# Patient Record
Sex: Male | Born: 1945 | Race: Black or African American | Hispanic: No | Marital: Married | Smoking: Former smoker
Health system: Southern US, Community
[De-identification: ages and names within clinical notes are randomized; demographics above are authoritative.]

## PROBLEM LIST (undated history)

## (undated) DIAGNOSIS — E119 Type 2 diabetes mellitus without complications: Secondary | ICD-10-CM

## (undated) DIAGNOSIS — I2510 Atherosclerotic heart disease of native coronary artery without angina pectoris: Secondary | ICD-10-CM

## (undated) DIAGNOSIS — I5022 Chronic systolic (congestive) heart failure: Secondary | ICD-10-CM

## (undated) DIAGNOSIS — N183 Chronic kidney disease, stage 3 unspecified: Secondary | ICD-10-CM

## (undated) DIAGNOSIS — N289 Disorder of kidney and ureter, unspecified: Secondary | ICD-10-CM

## (undated) DIAGNOSIS — I10 Essential (primary) hypertension: Secondary | ICD-10-CM

## (undated) DIAGNOSIS — Z95810 Presence of automatic (implantable) cardiac defibrillator: Secondary | ICD-10-CM

## (undated) DIAGNOSIS — C801 Malignant (primary) neoplasm, unspecified: Secondary | ICD-10-CM

## (undated) HISTORY — DX: Chronic kidney disease, stage 3 unspecified: N18.30

## (undated) HISTORY — PX: ICD IMPLANT: EP1208

## (undated) HISTORY — DX: Chronic systolic (congestive) heart failure: I50.22

## (undated) HISTORY — PX: KNEE ARTHROPLASTY: SHX992

## (undated) HISTORY — PX: CORONARY ANGIOPLASTY WITH STENT PLACEMENT: SHX49

## (undated) HISTORY — DX: Chronic kidney disease, stage 3 (moderate): N18.3

---

## 1974-05-19 HISTORY — PX: NEPHRECTOMY: SHX65

## 2014-10-18 DIAGNOSIS — Z87891 Personal history of nicotine dependence: Secondary | ICD-10-CM | POA: Diagnosis not present

## 2014-10-18 DIAGNOSIS — R531 Weakness: Secondary | ICD-10-CM | POA: Diagnosis not present

## 2014-10-18 DIAGNOSIS — Z8249 Family history of ischemic heart disease and other diseases of the circulatory system: Secondary | ICD-10-CM | POA: Diagnosis not present

## 2014-10-18 DIAGNOSIS — I10 Essential (primary) hypertension: Secondary | ICD-10-CM | POA: Diagnosis not present

## 2014-10-18 DIAGNOSIS — I519 Heart disease, unspecified: Secondary | ICD-10-CM | POA: Diagnosis not present

## 2014-10-18 DIAGNOSIS — I2510 Atherosclerotic heart disease of native coronary artery without angina pectoris: Secondary | ICD-10-CM | POA: Diagnosis not present

## 2014-10-18 DIAGNOSIS — R42 Dizziness and giddiness: Secondary | ICD-10-CM | POA: Diagnosis not present

## 2014-10-18 DIAGNOSIS — Z955 Presence of coronary angioplasty implant and graft: Secondary | ICD-10-CM | POA: Diagnosis not present

## 2014-10-18 DIAGNOSIS — R0602 Shortness of breath: Secondary | ICD-10-CM | POA: Diagnosis not present

## 2014-10-18 DIAGNOSIS — Z79899 Other long term (current) drug therapy: Secondary | ICD-10-CM | POA: Diagnosis not present

## 2014-10-18 DIAGNOSIS — Z8673 Personal history of transient ischemic attack (TIA), and cerebral infarction without residual deficits: Secondary | ICD-10-CM | POA: Diagnosis not present

## 2014-10-18 DIAGNOSIS — Z85528 Personal history of other malignant neoplasm of kidney: Secondary | ICD-10-CM | POA: Diagnosis not present

## 2014-10-18 DIAGNOSIS — R0609 Other forms of dyspnea: Secondary | ICD-10-CM | POA: Diagnosis not present

## 2014-11-10 DIAGNOSIS — W228XXA Striking against or struck by other objects, initial encounter: Secondary | ICD-10-CM | POA: Diagnosis not present

## 2014-11-10 DIAGNOSIS — S5001XA Contusion of right elbow, initial encounter: Secondary | ICD-10-CM | POA: Diagnosis not present

## 2014-11-10 DIAGNOSIS — Z85528 Personal history of other malignant neoplasm of kidney: Secondary | ICD-10-CM | POA: Diagnosis not present

## 2014-11-10 DIAGNOSIS — I10 Essential (primary) hypertension: Secondary | ICD-10-CM | POA: Diagnosis not present

## 2014-11-10 DIAGNOSIS — Z955 Presence of coronary angioplasty implant and graft: Secondary | ICD-10-CM | POA: Diagnosis not present

## 2014-11-10 DIAGNOSIS — I159 Secondary hypertension, unspecified: Secondary | ICD-10-CM | POA: Diagnosis not present

## 2014-11-10 DIAGNOSIS — Z8673 Personal history of transient ischemic attack (TIA), and cerebral infarction without residual deficits: Secondary | ICD-10-CM | POA: Diagnosis not present

## 2014-11-10 DIAGNOSIS — Z79899 Other long term (current) drug therapy: Secondary | ICD-10-CM | POA: Diagnosis not present

## 2014-11-10 DIAGNOSIS — Z8249 Family history of ischemic heart disease and other diseases of the circulatory system: Secondary | ICD-10-CM | POA: Diagnosis not present

## 2014-11-10 DIAGNOSIS — M25521 Pain in right elbow: Secondary | ICD-10-CM | POA: Diagnosis not present

## 2015-05-20 DIAGNOSIS — I2510 Atherosclerotic heart disease of native coronary artery without angina pectoris: Secondary | ICD-10-CM

## 2015-05-20 HISTORY — DX: Atherosclerotic heart disease of native coronary artery without angina pectoris: I25.10

## 2015-06-05 DIAGNOSIS — I10 Essential (primary) hypertension: Secondary | ICD-10-CM | POA: Diagnosis not present

## 2015-06-05 DIAGNOSIS — M109 Gout, unspecified: Secondary | ICD-10-CM | POA: Diagnosis not present

## 2015-06-05 DIAGNOSIS — M79672 Pain in left foot: Secondary | ICD-10-CM | POA: Diagnosis not present

## 2015-06-05 DIAGNOSIS — Z79899 Other long term (current) drug therapy: Secondary | ICD-10-CM | POA: Diagnosis not present

## 2015-06-05 DIAGNOSIS — Z85528 Personal history of other malignant neoplasm of kidney: Secondary | ICD-10-CM | POA: Diagnosis not present

## 2015-06-05 DIAGNOSIS — Z87891 Personal history of nicotine dependence: Secondary | ICD-10-CM | POA: Diagnosis not present

## 2015-06-05 DIAGNOSIS — Z8673 Personal history of transient ischemic attack (TIA), and cerebral infarction without residual deficits: Secondary | ICD-10-CM | POA: Diagnosis not present

## 2015-06-05 DIAGNOSIS — Z95818 Presence of other cardiac implants and grafts: Secondary | ICD-10-CM | POA: Diagnosis not present

## 2015-06-05 DIAGNOSIS — M7989 Other specified soft tissue disorders: Secondary | ICD-10-CM | POA: Diagnosis not present

## 2015-06-05 DIAGNOSIS — I519 Heart disease, unspecified: Secondary | ICD-10-CM | POA: Diagnosis not present

## 2015-10-12 DIAGNOSIS — Z85528 Personal history of other malignant neoplasm of kidney: Secondary | ICD-10-CM | POA: Diagnosis not present

## 2015-10-12 DIAGNOSIS — Z79899 Other long term (current) drug therapy: Secondary | ICD-10-CM | POA: Diagnosis not present

## 2015-10-12 DIAGNOSIS — G4730 Sleep apnea, unspecified: Secondary | ICD-10-CM | POA: Diagnosis not present

## 2015-10-12 DIAGNOSIS — I519 Heart disease, unspecified: Secondary | ICD-10-CM | POA: Diagnosis not present

## 2015-10-12 DIAGNOSIS — Z905 Acquired absence of kidney: Secondary | ICD-10-CM | POA: Diagnosis not present

## 2015-10-12 DIAGNOSIS — Z955 Presence of coronary angioplasty implant and graft: Secondary | ICD-10-CM | POA: Diagnosis not present

## 2015-10-12 DIAGNOSIS — Z8673 Personal history of transient ischemic attack (TIA), and cerebral infarction without residual deficits: Secondary | ICD-10-CM | POA: Diagnosis not present

## 2015-10-12 DIAGNOSIS — Z8249 Family history of ischemic heart disease and other diseases of the circulatory system: Secondary | ICD-10-CM | POA: Diagnosis not present

## 2015-10-12 DIAGNOSIS — Z7982 Long term (current) use of aspirin: Secondary | ICD-10-CM | POA: Diagnosis not present

## 2015-10-12 DIAGNOSIS — M109 Gout, unspecified: Secondary | ICD-10-CM | POA: Diagnosis not present

## 2015-10-12 DIAGNOSIS — I10 Essential (primary) hypertension: Secondary | ICD-10-CM | POA: Diagnosis not present

## 2016-01-06 DIAGNOSIS — Z79899 Other long term (current) drug therapy: Secondary | ICD-10-CM | POA: Diagnosis not present

## 2016-01-06 DIAGNOSIS — I10 Essential (primary) hypertension: Secondary | ICD-10-CM | POA: Diagnosis not present

## 2016-01-06 DIAGNOSIS — I5042 Chronic combined systolic (congestive) and diastolic (congestive) heart failure: Secondary | ICD-10-CM | POA: Diagnosis not present

## 2016-01-06 DIAGNOSIS — I130 Hypertensive heart and chronic kidney disease with heart failure and stage 1 through stage 4 chronic kidney disease, or unspecified chronic kidney disease: Secondary | ICD-10-CM | POA: Diagnosis not present

## 2016-01-06 DIAGNOSIS — N183 Chronic kidney disease, stage 3 (moderate): Secondary | ICD-10-CM | POA: Diagnosis not present

## 2016-01-06 DIAGNOSIS — E785 Hyperlipidemia, unspecified: Secondary | ICD-10-CM | POA: Diagnosis not present

## 2016-01-06 DIAGNOSIS — I2510 Atherosclerotic heart disease of native coronary artery without angina pectoris: Secondary | ICD-10-CM | POA: Diagnosis not present

## 2016-01-06 DIAGNOSIS — R42 Dizziness and giddiness: Secondary | ICD-10-CM | POA: Diagnosis not present

## 2016-01-06 DIAGNOSIS — Z955 Presence of coronary angioplasty implant and graft: Secondary | ICD-10-CM | POA: Diagnosis not present

## 2016-01-07 DIAGNOSIS — N183 Chronic kidney disease, stage 3 (moderate): Secondary | ICD-10-CM | POA: Diagnosis not present

## 2016-01-07 DIAGNOSIS — I5042 Chronic combined systolic (congestive) and diastolic (congestive) heart failure: Secondary | ICD-10-CM | POA: Diagnosis not present

## 2016-01-07 DIAGNOSIS — E784 Other hyperlipidemia: Secondary | ICD-10-CM | POA: Diagnosis not present

## 2016-01-07 DIAGNOSIS — R42 Dizziness and giddiness: Secondary | ICD-10-CM | POA: Diagnosis not present

## 2016-01-08 DIAGNOSIS — R42 Dizziness and giddiness: Secondary | ICD-10-CM | POA: Diagnosis not present

## 2016-01-08 DIAGNOSIS — E784 Other hyperlipidemia: Secondary | ICD-10-CM | POA: Diagnosis not present

## 2016-01-08 DIAGNOSIS — I5042 Chronic combined systolic (congestive) and diastolic (congestive) heart failure: Secondary | ICD-10-CM | POA: Diagnosis not present

## 2016-01-08 DIAGNOSIS — N183 Chronic kidney disease, stage 3 (moderate): Secondary | ICD-10-CM | POA: Diagnosis not present

## 2016-01-15 DIAGNOSIS — I25118 Atherosclerotic heart disease of native coronary artery with other forms of angina pectoris: Secondary | ICD-10-CM | POA: Diagnosis not present

## 2016-01-15 DIAGNOSIS — N183 Chronic kidney disease, stage 3 (moderate): Secondary | ICD-10-CM | POA: Diagnosis not present

## 2016-01-15 DIAGNOSIS — H8113 Benign paroxysmal vertigo, bilateral: Secondary | ICD-10-CM | POA: Diagnosis not present

## 2016-01-15 DIAGNOSIS — G4733 Obstructive sleep apnea (adult) (pediatric): Secondary | ICD-10-CM | POA: Diagnosis not present

## 2016-01-15 DIAGNOSIS — I10 Essential (primary) hypertension: Secondary | ICD-10-CM | POA: Diagnosis not present

## 2016-01-15 DIAGNOSIS — E784 Other hyperlipidemia: Secondary | ICD-10-CM | POA: Diagnosis not present

## 2016-01-15 DIAGNOSIS — I5032 Chronic diastolic (congestive) heart failure: Secondary | ICD-10-CM | POA: Diagnosis not present

## 2016-03-24 DIAGNOSIS — Z85528 Personal history of other malignant neoplasm of kidney: Secondary | ICD-10-CM | POA: Diagnosis not present

## 2016-03-24 DIAGNOSIS — Z87891 Personal history of nicotine dependence: Secondary | ICD-10-CM | POA: Diagnosis not present

## 2016-03-24 DIAGNOSIS — N183 Chronic kidney disease, stage 3 (moderate): Secondary | ICD-10-CM | POA: Diagnosis not present

## 2016-03-24 DIAGNOSIS — E119 Type 2 diabetes mellitus without complications: Secondary | ICD-10-CM | POA: Diagnosis not present

## 2016-03-24 DIAGNOSIS — I130 Hypertensive heart and chronic kidney disease with heart failure and stage 1 through stage 4 chronic kidney disease, or unspecified chronic kidney disease: Secondary | ICD-10-CM | POA: Diagnosis not present

## 2016-03-24 DIAGNOSIS — I509 Heart failure, unspecified: Secondary | ICD-10-CM | POA: Diagnosis not present

## 2016-03-24 DIAGNOSIS — Z7982 Long term (current) use of aspirin: Secondary | ICD-10-CM | POA: Diagnosis not present

## 2016-03-24 DIAGNOSIS — R079 Chest pain, unspecified: Secondary | ICD-10-CM | POA: Diagnosis not present

## 2016-03-24 DIAGNOSIS — Z8249 Family history of ischemic heart disease and other diseases of the circulatory system: Secondary | ICD-10-CM | POA: Diagnosis not present

## 2016-03-24 DIAGNOSIS — Z8673 Personal history of transient ischemic attack (TIA), and cerebral infarction without residual deficits: Secondary | ICD-10-CM | POA: Diagnosis not present

## 2016-03-24 DIAGNOSIS — Z79899 Other long term (current) drug therapy: Secondary | ICD-10-CM | POA: Diagnosis not present

## 2016-03-24 DIAGNOSIS — Z7902 Long term (current) use of antithrombotics/antiplatelets: Secondary | ICD-10-CM | POA: Diagnosis not present

## 2016-03-24 DIAGNOSIS — K2970 Gastritis, unspecified, without bleeding: Secondary | ICD-10-CM | POA: Diagnosis not present

## 2016-03-24 DIAGNOSIS — Z955 Presence of coronary angioplasty implant and graft: Secondary | ICD-10-CM | POA: Diagnosis not present

## 2016-06-23 DIAGNOSIS — B0229 Other postherpetic nervous system involvement: Secondary | ICD-10-CM | POA: Diagnosis not present

## 2016-06-23 DIAGNOSIS — B029 Zoster without complications: Secondary | ICD-10-CM | POA: Diagnosis not present

## 2017-09-12 DIAGNOSIS — Z79899 Other long term (current) drug therapy: Secondary | ICD-10-CM | POA: Diagnosis not present

## 2017-09-12 DIAGNOSIS — I119 Hypertensive heart disease without heart failure: Secondary | ICD-10-CM | POA: Diagnosis not present

## 2017-09-12 DIAGNOSIS — S91202A Unspecified open wound of left great toe with damage to nail, initial encounter: Secondary | ICD-10-CM | POA: Diagnosis not present

## 2017-09-12 DIAGNOSIS — W230XXA Caught, crushed, jammed, or pinched between moving objects, initial encounter: Secondary | ICD-10-CM | POA: Diagnosis not present

## 2017-09-12 DIAGNOSIS — Z87891 Personal history of nicotine dependence: Secondary | ICD-10-CM | POA: Diagnosis not present

## 2017-09-12 DIAGNOSIS — Z7902 Long term (current) use of antithrombotics/antiplatelets: Secondary | ICD-10-CM | POA: Diagnosis not present

## 2017-09-12 DIAGNOSIS — S91209A Unspecified open wound of unspecified toe(s) with damage to nail, initial encounter: Secondary | ICD-10-CM | POA: Diagnosis not present

## 2017-09-12 DIAGNOSIS — Z8673 Personal history of transient ischemic attack (TIA), and cerebral infarction without residual deficits: Secondary | ICD-10-CM | POA: Diagnosis not present

## 2017-09-12 DIAGNOSIS — Z955 Presence of coronary angioplasty implant and graft: Secondary | ICD-10-CM | POA: Diagnosis not present

## 2017-09-12 DIAGNOSIS — Z8249 Family history of ischemic heart disease and other diseases of the circulatory system: Secondary | ICD-10-CM | POA: Diagnosis not present

## 2017-11-29 ENCOUNTER — Emergency Department (HOSPITAL_COMMUNITY): Payer: No Typology Code available for payment source

## 2017-11-29 ENCOUNTER — Other Ambulatory Visit: Payer: Self-pay

## 2017-11-29 ENCOUNTER — Inpatient Hospital Stay (HOSPITAL_COMMUNITY)
Admission: EM | Admit: 2017-11-29 | Discharge: 2017-12-01 | DRG: 291 | Disposition: A | Discharge status: Home / Self Care | Payer: No Typology Code available for payment source | Attending: Internal Medicine | Admitting: Internal Medicine

## 2017-11-29 ENCOUNTER — Encounter (HOSPITAL_COMMUNITY): Payer: Self-pay | Admitting: Emergency Medicine

## 2017-11-29 DIAGNOSIS — I130 Hypertensive heart and chronic kidney disease with heart failure and stage 1 through stage 4 chronic kidney disease, or unspecified chronic kidney disease: Principal | ICD-10-CM | POA: Diagnosis present

## 2017-11-29 DIAGNOSIS — I441 Atrioventricular block, second degree: Secondary | ICD-10-CM | POA: Diagnosis present

## 2017-11-29 DIAGNOSIS — I2510 Atherosclerotic heart disease of native coronary artery without angina pectoris: Secondary | ICD-10-CM | POA: Diagnosis present

## 2017-11-29 DIAGNOSIS — N183 Chronic kidney disease, stage 3 (moderate): Secondary | ICD-10-CM | POA: Diagnosis present

## 2017-11-29 DIAGNOSIS — R079 Chest pain, unspecified: Secondary | ICD-10-CM | POA: Diagnosis not present

## 2017-11-29 DIAGNOSIS — N400 Enlarged prostate without lower urinary tract symptoms: Secondary | ICD-10-CM | POA: Diagnosis present

## 2017-11-29 DIAGNOSIS — Z7982 Long term (current) use of aspirin: Secondary | ICD-10-CM

## 2017-11-29 DIAGNOSIS — R402142 Coma scale, eyes open, spontaneous, at arrival to emergency department: Secondary | ICD-10-CM | POA: Diagnosis present

## 2017-11-29 DIAGNOSIS — Z87891 Personal history of nicotine dependence: Secondary | ICD-10-CM | POA: Diagnosis not present

## 2017-11-29 DIAGNOSIS — I509 Heart failure, unspecified: Secondary | ICD-10-CM

## 2017-11-29 DIAGNOSIS — I110 Hypertensive heart disease with heart failure: Secondary | ICD-10-CM | POA: Diagnosis not present

## 2017-11-29 DIAGNOSIS — R0602 Shortness of breath: Secondary | ICD-10-CM | POA: Diagnosis not present

## 2017-11-29 DIAGNOSIS — R140 Abdominal distension (gaseous): Secondary | ICD-10-CM | POA: Diagnosis not present

## 2017-11-29 DIAGNOSIS — R402252 Coma scale, best verbal response, oriented, at arrival to emergency department: Secondary | ICD-10-CM | POA: Diagnosis present

## 2017-11-29 DIAGNOSIS — R402362 Coma scale, best motor response, obeys commands, at arrival to emergency department: Secondary | ICD-10-CM | POA: Diagnosis present

## 2017-11-29 DIAGNOSIS — M109 Gout, unspecified: Secondary | ICD-10-CM | POA: Diagnosis present

## 2017-11-29 DIAGNOSIS — Z7902 Long term (current) use of antithrombotics/antiplatelets: Secondary | ICD-10-CM

## 2017-11-29 DIAGNOSIS — E8770 Fluid overload, unspecified: Secondary | ICD-10-CM | POA: Diagnosis not present

## 2017-11-29 DIAGNOSIS — G4733 Obstructive sleep apnea (adult) (pediatric): Secondary | ICD-10-CM | POA: Diagnosis present

## 2017-11-29 DIAGNOSIS — Z85528 Personal history of other malignant neoplasm of kidney: Secondary | ICD-10-CM

## 2017-11-29 DIAGNOSIS — Z888 Allergy status to other drugs, medicaments and biological substances status: Secondary | ICD-10-CM

## 2017-11-29 DIAGNOSIS — I447 Left bundle-branch block, unspecified: Secondary | ICD-10-CM | POA: Diagnosis not present

## 2017-11-29 DIAGNOSIS — R110 Nausea: Secondary | ICD-10-CM | POA: Diagnosis not present

## 2017-11-29 DIAGNOSIS — Z79899 Other long term (current) drug therapy: Secondary | ICD-10-CM

## 2017-11-29 DIAGNOSIS — I351 Nonrheumatic aortic (valve) insufficiency: Secondary | ICD-10-CM | POA: Diagnosis not present

## 2017-11-29 DIAGNOSIS — E1122 Type 2 diabetes mellitus with diabetic chronic kidney disease: Secondary | ICD-10-CM | POA: Diagnosis present

## 2017-11-29 DIAGNOSIS — E119 Type 2 diabetes mellitus without complications: Secondary | ICD-10-CM | POA: Diagnosis not present

## 2017-11-29 DIAGNOSIS — Z955 Presence of coronary angioplasty implant and graft: Secondary | ICD-10-CM

## 2017-11-29 DIAGNOSIS — I340 Nonrheumatic mitral (valve) insufficiency: Secondary | ICD-10-CM | POA: Diagnosis not present

## 2017-11-29 DIAGNOSIS — Z9989 Dependence on other enabling machines and devices: Secondary | ICD-10-CM

## 2017-11-29 DIAGNOSIS — I5020 Unspecified systolic (congestive) heart failure: Secondary | ICD-10-CM | POA: Diagnosis not present

## 2017-11-29 DIAGNOSIS — R0789 Other chest pain: Secondary | ICD-10-CM | POA: Diagnosis not present

## 2017-11-29 DIAGNOSIS — I5023 Acute on chronic systolic (congestive) heart failure: Secondary | ICD-10-CM | POA: Diagnosis not present

## 2017-11-29 DIAGNOSIS — Z905 Acquired absence of kidney: Secondary | ICD-10-CM | POA: Diagnosis not present

## 2017-11-29 DIAGNOSIS — R0902 Hypoxemia: Secondary | ICD-10-CM | POA: Diagnosis not present

## 2017-11-29 DIAGNOSIS — Z794 Long term (current) use of insulin: Secondary | ICD-10-CM

## 2017-11-29 DIAGNOSIS — R0600 Dyspnea, unspecified: Secondary | ICD-10-CM

## 2017-11-29 HISTORY — DX: Essential (primary) hypertension: I10

## 2017-11-29 HISTORY — DX: Atherosclerotic heart disease of native coronary artery without angina pectoris: I25.10

## 2017-11-29 HISTORY — DX: Malignant (primary) neoplasm, unspecified: C80.1

## 2017-11-29 LAB — BASIC METABOLIC PANEL
ANION GAP: 7 (ref 5–15)
BUN: 13 mg/dL (ref 8–23)
CALCIUM: 8.8 mg/dL — AB (ref 8.9–10.3)
CO2: 24 mmol/L (ref 22–32)
Chloride: 109 mmol/L (ref 98–111)
Creatinine, Ser: 1.61 mg/dL — ABNORMAL HIGH (ref 0.61–1.24)
GFR calc non Af Amer: 42 mL/min — ABNORMAL LOW (ref >60)
GFR, EST AFRICAN AMERICAN: 48 mL/min — AB (ref >60)
Glucose, Bld: 139 mg/dL — ABNORMAL HIGH (ref 70–99)
Potassium: 4 mmol/L (ref 3.5–5.1)
SODIUM: 140 mmol/L (ref 135–145)

## 2017-11-29 LAB — HEPATIC FUNCTION PANEL
ALBUMIN: 3.4 g/dL — AB (ref 3.5–5.0)
ALK PHOS: 78 U/L (ref 38–126)
ALT: 23 U/L (ref 0–44)
AST: 20 U/L (ref 15–41)
BILIRUBIN INDIRECT: 0.6 mg/dL (ref 0.3–0.9)
Bilirubin, Direct: 0.2 mg/dL (ref 0.0–0.2)
TOTAL PROTEIN: 6.6 g/dL (ref 6.5–8.1)
Total Bilirubin: 0.8 mg/dL (ref 0.3–1.2)

## 2017-11-29 LAB — GLUCOSE, CAPILLARY
GLUCOSE-CAPILLARY: 140 mg/dL — AB (ref 70–99)
GLUCOSE-CAPILLARY: 125 mg/dL — AB (ref 70–99)
Glucose-Capillary: 137 mg/dL — ABNORMAL HIGH (ref 70–99)

## 2017-11-29 LAB — CBC
HCT: 47.6 % (ref 39.0–52.0)
Hemoglobin: 14.5 g/dL (ref 13.0–17.0)
MCH: 28.5 pg (ref 26.0–34.0)
MCHC: 30.5 g/dL (ref 30.0–36.0)
MCV: 93.5 fL (ref 78.0–100.0)
PLATELETS: 157 10*3/uL (ref 150–400)
RBC: 5.09 MIL/uL (ref 4.22–5.81)
RDW: 12.8 % (ref 11.5–15.5)
WBC: 7 10*3/uL (ref 4.0–10.5)

## 2017-11-29 LAB — LACTIC ACID, PLASMA: Lactic Acid, Venous: 1.9 mmol/L (ref 0.5–1.9)

## 2017-11-29 LAB — TROPONIN I
TROPONIN I: 0.08 ng/mL — AB (ref <0.03)
Troponin I: 0.08 ng/mL (ref <0.03)

## 2017-11-29 LAB — BRAIN NATRIURETIC PEPTIDE: B NATRIURETIC PEPTIDE 5: 526.2 pg/mL — AB (ref 0.0–100.0)

## 2017-11-29 LAB — I-STAT TROPONIN, ED: TROPONIN I, POC: 0.05 ng/mL (ref 0.00–0.08)

## 2017-11-29 MED ORDER — HEPARIN SODIUM (PORCINE) 5000 UNIT/ML IJ SOLN
5000.0000 [IU] | Freq: Three times a day (TID) | INTRAMUSCULAR | Status: DC
  Administered 2017-11-29 – 2017-12-01 (×6): 5000 [IU] via SUBCUTANEOUS
  Filled 2017-11-29 (×6): qty 1

## 2017-11-29 MED ORDER — ALBUTEROL SULFATE (2.5 MG/3ML) 0.083% IN NEBU
5.0000 mg | INHALATION_SOLUTION | Freq: Once | RESPIRATORY_TRACT | Status: AC
  Administered 2017-11-29: 5 mg via RESPIRATORY_TRACT
  Filled 2017-11-29: qty 6

## 2017-11-29 MED ORDER — SODIUM CHLORIDE 0.9 % IV SOLN
1.0000 g | INTRAVENOUS | Status: DC
  Administered 2017-11-29: 1 g via INTRAVENOUS
  Filled 2017-11-29: qty 10

## 2017-11-29 MED ORDER — HYPROMELLOSE (GONIOSCOPIC) 2.5 % OP SOLN
1.0000 [drp] | OPHTHALMIC | Status: DC | PRN
  Filled 2017-11-29: qty 15

## 2017-11-29 MED ORDER — INSULIN ASPART 100 UNIT/ML ~~LOC~~ SOLN
0.0000 [IU] | Freq: Three times a day (TID) | SUBCUTANEOUS | Status: DC
  Administered 2017-11-29: 1 [IU] via SUBCUTANEOUS
  Administered 2017-11-30: 2 [IU] via SUBCUTANEOUS
  Administered 2017-11-30 – 2017-12-01 (×2): 1 [IU] via SUBCUTANEOUS

## 2017-11-29 MED ORDER — INSULIN ASPART 100 UNIT/ML ~~LOC~~ SOLN
3.0000 [IU] | Freq: Three times a day (TID) | SUBCUTANEOUS | Status: DC
  Administered 2017-11-29 – 2017-12-01 (×6): 3 [IU] via SUBCUTANEOUS

## 2017-11-29 MED ORDER — INSULIN GLARGINE 100 UNIT/ML ~~LOC~~ SOLN
20.0000 [IU] | Freq: Every day | SUBCUTANEOUS | Status: DC
  Administered 2017-11-29 – 2017-11-30 (×2): 20 [IU] via SUBCUTANEOUS
  Filled 2017-11-29 (×3): qty 0.2

## 2017-11-29 MED ORDER — ISOSORBIDE DINITRATE 10 MG PO TABS
20.0000 mg | ORAL_TABLET | Freq: Three times a day (TID) | ORAL | Status: DC
  Administered 2017-11-29 – 2017-12-01 (×6): 20 mg via ORAL
  Filled 2017-11-29 (×7): qty 2

## 2017-11-29 MED ORDER — ALLOPURINOL 300 MG PO TABS
300.0000 mg | ORAL_TABLET | Freq: Every day | ORAL | Status: DC
  Administered 2017-11-29 – 2017-12-01 (×3): 300 mg via ORAL
  Filled 2017-11-29 (×3): qty 1

## 2017-11-29 MED ORDER — CLOPIDOGREL BISULFATE 75 MG PO TABS
75.0000 mg | ORAL_TABLET | Freq: Every day | ORAL | Status: DC
  Administered 2017-11-29 – 2017-12-01 (×3): 75 mg via ORAL
  Filled 2017-11-29 (×3): qty 1

## 2017-11-29 MED ORDER — ACETAMINOPHEN 650 MG RE SUPP: 650.0000 mg | Freq: Four times a day (QID) | RECTAL | Status: DC | PRN

## 2017-11-29 MED ORDER — HYDRALAZINE HCL 25 MG PO TABS
25.0000 mg | ORAL_TABLET | Freq: Three times a day (TID) | ORAL | Status: DC
  Administered 2017-11-29 – 2017-12-01 (×6): 25 mg via ORAL
  Filled 2017-11-29 (×6): qty 1

## 2017-11-29 MED ORDER — CAPSAICIN 0.075 % EX CREA
1.0000 "application " | TOPICAL_CREAM | CUTANEOUS | Status: DC | PRN
  Filled 2017-11-29: qty 60

## 2017-11-29 MED ORDER — FUROSEMIDE 10 MG/ML IJ SOLN
40.0000 mg | Freq: Once | INTRAMUSCULAR | Status: AC
  Administered 2017-11-29: 40 mg via INTRAVENOUS
  Filled 2017-11-29: qty 4

## 2017-11-29 MED ORDER — FUROSEMIDE 10 MG/ML IJ SOLN: 40.0000 mg | Freq: Once | INTRAMUSCULAR | Status: DC

## 2017-11-29 MED ORDER — ACETAMINOPHEN 325 MG PO TABS
650.0000 mg | ORAL_TABLET | Freq: Four times a day (QID) | ORAL | Status: DC | PRN
  Administered 2017-11-30: 650 mg via ORAL
  Filled 2017-11-29: qty 2

## 2017-11-29 MED ORDER — ASPIRIN EC 81 MG PO TBEC
81.0000 mg | DELAYED_RELEASE_TABLET | Freq: Every day | ORAL | Status: DC
  Administered 2017-11-29 – 2017-12-01 (×3): 81 mg via ORAL
  Filled 2017-11-29 (×3): qty 1

## 2017-11-29 MED ORDER — SODIUM CHLORIDE 0.9 % IV SOLN
500.0000 mg | INTRAVENOUS | Status: DC
  Administered 2017-11-29: 500 mg via INTRAVENOUS
  Filled 2017-11-29: qty 500

## 2017-11-29 MED ORDER — SODIUM CHLORIDE 0.9 % IV SOLN
INTRAVENOUS | Status: DC | PRN
  Administered 2017-11-29: 12:00:00 via INTRAVENOUS

## 2017-11-29 MED ORDER — FINASTERIDE 5 MG PO TABS
5.0000 mg | ORAL_TABLET | Freq: Every day | ORAL | Status: DC
  Administered 2017-11-29 – 2017-12-01 (×3): 5 mg via ORAL
  Filled 2017-11-29 (×3): qty 1

## 2017-11-29 MED ORDER — ATORVASTATIN CALCIUM 10 MG PO TABS
10.0000 mg | ORAL_TABLET | Freq: Every day | ORAL | Status: DC
  Administered 2017-11-29 – 2017-11-30 (×2): 10 mg via ORAL
  Filled 2017-11-29 (×2): qty 1

## 2017-11-29 NOTE — Progress Notes (Signed)
Pt lying in bed wife at the bedside no concerns at this time.

## 2017-11-29 NOTE — Progress Notes (Signed)
Critical Trop 0.08 called from lab. MD made aware.

## 2017-11-29 NOTE — ED Triage Notes (Signed)
Pt arrives via EMS with complaints of SOB. Pt in town for family reunion still feels SOB after medications. Hx of CHF, HTN, multiple stents. Per EMS, pt 88% on RA. Pt feels more swollen in abd. Denies CP. 324 ASA and 2 nitro given

## 2017-11-29 NOTE — H&P (Addendum)
Date: 11/29/2017               Patient Name:  Jonathan Floyd MRN: [id number]  DOB: 1946/03/15 Age / Sex: 73 y.o., male   PCP: System, Pcp Not In              Medical Service: Internal Medicine Teaching Service              Attending Physician: Dr. Lynnae January, Real Cons, MD    First Contact: Rhetta Mura, MS  Pager: [phone]  Second Contact: Dr. Jari Favre  Pager: [phone]            After Hours (After 5p/  First Contact Pager: [phone]  weekends / holidays): Second Contact Pager: [phone]   Chief Complaint: Shortness of Breath and cough for one day   History of Present Illness: Jonathan Floyd is a 13 yoM with a history of CHF, diabetes, htn, 3 stents placed in coronary arteries with the last one being placed in the proximal LAD in 2017, remote history of cancer s/p nephrectomy in 1976, who presents with SOB, cough and mild chest pain for 1 day. He stated that he is visiting town for a family reunion and lives in [place], [place]. Since he is out of town he missed a dose of his "fluid pill" yesterday and has had a difficult time adhering to a heart healthy, low-sodium, diet on the road and at his family reunion. He stated that yesterday he began feeling a little SOB with increased difficulty with exertion. Last night his shortness of breath worsened and he developed a cough occasionally productive of some sputum. He had increased orthopnea and required 2 pillows to sleep. He endorses increased fatigue over the past two days and some mild central chest pain today that is not worsened with activity, does not radiate, is not pleuritic in nature and does not feel like the chest pain he had before he had his stents put in. This past Thursday he was able to walk around his yard to cut grass, but he finds walking up a flight of stairs difficult at baseline. He denies any changes in urination or bowel movements, changes in vision, weakness, feeling febrile, or chills. 2 weeks ago, [facility] pints of fluid taken off with IV Lasix.   Talking to the patient, he says that he has not taken Carvedilol for 6 months because he is "not supposed to take it if his BP is >110."  In the ED he was given Nitroglycerine and aspirin 324mg  and 40 mg of IV Lasix. He had a chest x-ray, which revealed diffuse pulmonary edema as well as a focal consolidation in the LLL that could represent consolidation of edema vs pneumonia. Due to this finding he was given azithromycin and ceftriaxone. Since admission he has been afebrile with stable vitals. At the time of admission BNP was 526, BMP was remarkable fro Cr of 1.61 and WBC count was within normal limits at 7.0.         Meds: Current Meds  Medication Sig  . acetaminophen (TYLENOL) 500 MG tablet Take 1,000 mg by mouth as needed for mild pain or headache.  . allopurinol (ZYLOPRIM) 300 MG tablet Take 300 mg by mouth daily.  Marland Kitchen amLODipine (NORVASC) 10 MG tablet Take 10 mg by mouth daily.  Marland Kitchen aspirin EC 81 MG tablet Take 81 mg by mouth daily.  Marland Kitchen atorvastatin (LIPITOR) 10 MG tablet Take 10 mg  by mouth daily at 6 PM.  . capsicum (ZOSTRIX) 0.075 % topical cream Apply 1 application topically as needed (foot pain).  . carvedilol (COREG) 25 MG tablet Take 12.5 mg by mouth 2 (two) times daily with a meal. Do not take if Systolic BP is < 409 or HR is less than 60  . clopidogrel (PLAVIX) 75 MG tablet Take 75 mg by mouth daily.  . ergocalciferol (VITAMIN D2) 50000 units capsule Take 50,000 Units by mouth 2 (two) times a week.  . finasteride (PROSCAR) 5 MG tablet Take 5 mg by mouth daily.  Marland Kitchen guaiFENesin 200 MG tablet Take 400 mg by mouth 3 (three) times daily.  . hydrALAZINE (APRESOLINE) 50 MG tablet Take 25 mg by mouth 3 (three) times daily.  . hydroxypropyl methylcellulose / hypromellose (ISOPTO TEARS / GONIOVISC) 2.5 % ophthalmic solution Place 1 drop into both eyes as needed for dry eyes.  . insulin glargine (LANTUS) 100 UNIT/ML injection Inject  30 Units into the skin at bedtime.  . isosorbide dinitrate (ISORDIL) 10 MG tablet Take 20 mg by mouth 3 (three) times daily.  . nitroGLYCERIN (NITROSTAT) 0.4 MG SL tablet Place 0.4 mg under the tongue every 5 (five) minutes as needed for chest pain.  . potassium & sodium phosphates (PHOS-NAK) 280-160-250 MG PACK Take 1 packet by mouth 2 (two) times daily.  Marland Kitchen torsemide (DEMADEX) 20 MG tablet Take 20 mg by mouth daily.   Allergies: Allergies as of 11/29/2017 - Review Complete 11/29/2017  Allergen Reaction Noted  . Iodine  11/29/2017   Past Medical History:  Diagnosis Date  . Cancer ([place])    43 years ago- kidney taken out for it  . CHF (congestive heart failure) ([place])   . Coronary artery disease   . Hypertension   . Renal disorder    1 kidney   Family History: "heart problems" in his father  Social History: The patient lives in [place], [place], and is visiting for a family reunion. He is accompanied by his wife, two of his 4 children and their significant others. One of his children was just married this past June. He denies alcohol or recreational drug use and states that it has been 20 years since he was a smoker.    Review of Systems: A complete ROS was negative except as per HPI.   Physical Exam: Blood pressure (!) 147/90, pulse 87, temperature 97.6 F (36.4 C), temperature source Oral, resp. rate (!) 29, height 5\' 9"  (1.753 m), weight 110.7 kg (244 lb), SpO2 93 %. General: No acute distress, pleasant and alert elderly man, more comfortable with head of bead at 45 degrees, able to answer questions in complete sentences with minimal difficulty   Pulm: No increased work of breathing, bilateral crackles, mainly in lower lung fields CV: heart sounds faint, RRR, nMRG appreciated, JVD past angle of mandible bilaterally, unable to appreciate hepatojugular reflex due to extent of JVD Abd: abdomen mildly distended, BS+, no tenderness to palpation  Ext: dorsalis pedis pulse > RLE than LLE,  radial pulses present bilaterally, trace pitting edema to shins in bilateral lower extremities, bilateral upper and lower extremities mildly cool to palpation   EKG: personally reviewed my interpretation is LBBB in sinus rhythm. No signs of ischemia by Scarbosa criteria. No prior EKG available to compare to.    CXR: personally reviewed my interpretation is pulmonary edema evident. Consolidation in LLL that could represent pneumonia vs. focal consolidation of edema.    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   CHF exacerbation/ Shortness of breath:  Jonathan Floyd has a history of CHF and per the patient, was admitted 2 weeks ago to get fluid taken off at the [place]. Given his circumstance of his recent trip, missing a dose of his fluid pill, and relative dietary indiscretion, added to his history, findings of bilateral crackles on puim exam, and BNP of 526 on admission, CHF exacerbation is the most likely cause of his presentation. Chest x-ray could be interpreted as a LLL pneumonia, but given the patient's  lack of fever, unimpressive cough and lack of white count, this does not seem like the most likely solution. The patient's extremities were cool on examination, raising concern for lower output heart failure.  -Check Lactic Acid in setting of cool extremities- start Milrinone if Lactate Elevated  -IV Lasix 40 mg- monitor response, Cr and Uopt  -Continue Atorvastatin  -Continue Hydralazine, hold amlodipine  -Continue Clopidogrel 75  -hold on BB for now due to acute CHF exacerbation and patient no taking prior to admission; inpatient, but will provide counseling to titrate this medication up with PCP on discharge   Chest Pain: Given the  patient's history of having 3 cardiac stents placed this is a concerning finding. However, the patient is not diaphoretic and notes that this pain does not radiate and does not feel like the pain he had before the stents were placed. In the ED he had an EKG taken that revealed a LBBB, but no acute ischemia by Scarbosa's Criteria. This is limited because there is no previous EKG available for compare. -trend troponins through admission -Nitroglycerine PRN -EKG in am  T2DM: The patient is on the home regimen of 30 units of insulin  - 20 units Lantus qhs  - Novolog- 3 units TID with meals+ SSI   History of Gout: -continue allopurinol at home dose 300 mg   BPH: -continue finasteride 5 mg   FEN/GI: Heart Healthy diet, monitor and replete electrolytes as needed K>4 and Mg >2 PPx: Heparin Code: FULL   Dispo: Admit patient to Inpatient with expected length of stay greater than 2 midnights.  Signed: Rhetta Mura, Medical Student 11/29/2017, 12:51 PM  Pager: [phone]  Attestation for Student Documentation:  I personally was present and performed or re-performed the history, physical exam and medical decision-making activities of this service and have verified that the service and findings are accurately documented in the student's note.  Alphonzo Grieve, MD 11/29/2017, 2:46 PM

## 2017-11-29 NOTE — ED Provider Notes (Signed)
[facility] EMERGENCY DEPARTMENT Provider Note   CSN: [id number] Arrival date & time: 11/29/17  3557     History   Chief Complaint Chief Complaint  Patient presents with  . Shortness of Breath    HPI Jonathan Floyd is a 72 y.o. male.  72 year old male with history of CHF presents with 24 hours of increasing dyspnea on exertion as well as orthopnea.  Has had nonproductive cough which is been increasing without fever or chills.  No vomiting appreciated.  Denies any substernal chest pain or pressure.  Patient has been compliant with his medications currently.  Has noted increasing lower extremity edema.  Called EMS and patient's room air pulse ox was 80%.  Was given nitroglycerin and aspirin and transported here.     Past Medical History:  Diagnosis Date  . Cancer ([place])    43 years ago- kidney taken out for it  . CHF (congestive heart failure) ([place])   . Coronary artery disease   . Hypertension   . Renal disorder    1 kidney    There are no active problems to display for this patient.         Home Medications    Prior to Admission medications   Not on File    Family History No family history on file.  Social History Social History   Tobacco Use  . Smoking status: Former Research scientist (life sciences)  . Smokeless tobacco: Never Used  Substance Use Topics  . Alcohol use: Not on file  . Drug use: Not on file     Allergies   Patient has no allergy information on record.   Review of Systems Review of Systems  All other systems reviewed and are negative.    Physical Exam Updated Vital Signs BP (!) 144/101   Pulse 87   Temp 97.6 F (36.4 C) (Oral)   Resp (!) 27   Ht 1.753 m (5\' 9" )   Wt 110.7 kg (244 lb)   SpO2 96%   BMI 36.03 kg/m   Physical Exam  Constitutional: He is oriented to person, place, and time. He appears well-developed and well-nourished.  Non-toxic appearance. No distress.  HENT:  Head: Normocephalic and atraumatic.  Eyes: Pupils  are equal, round, and reactive to light. Conjunctivae, EOM and lids are normal.  Neck: Normal range of motion. Neck supple. No tracheal deviation present. No thyroid mass present.  Cardiovascular: Normal rate, regular rhythm and normal heart sounds. Exam reveals no gallop.  No murmur heard. Pulmonary/Chest: No stridor. Tachypnea noted. No respiratory distress. He has decreased breath sounds in the right lower field and the left lower field. He has no wheezes. He has rhonchi in the right lower field and the left lower field. He has no rales.  Abdominal: Soft. Normal appearance and bowel sounds are normal. He exhibits no distension. There is no tenderness. There is no rebound and no CVA tenderness.  Musculoskeletal: Normal range of motion. He exhibits no edema or tenderness.  Lymphadenopathy:  2+ bilateral lower extremity pitting edema  Neurological: He is alert and oriented to person, place, and time. He has normal strength. No cranial nerve deficit or sensory deficit. GCS eye subscore is 4. GCS verbal subscore is 5. GCS motor subscore is 6.  Skin: Skin is warm and dry. No abrasion and no rash noted.  Psychiatric: He has a normal mood and affect. His speech is normal and behavior is normal.  Nursing note and vitals reviewed.    ED Treatments /  Results  Labs (all labs ordered are listed, but only abnormal results are displayed) Labs Reviewed  BRAIN NATRIURETIC PEPTIDE  BASIC METABOLIC PANEL  CBC  I-STAT TROPONIN, ED    EKG EKG Interpretation  Date/Time:  Sunday November 29 2017 08:36:45 EDT Ventricular Rate:  90 PR Interval:    QRS Duration: 168 QT Interval:  415 QTC Calculation: 508 R Axis:   38 Text Interpretation:  Sinus rhythm Left bundle branch block Confirmed by Lacretia Leigh ([id number]) on 11/29/2017 9:01:25 AM   Radiology No results found.  Procedures Procedures (including critical care time)  Medications Ordered in ED Medications  albuterol (PROVENTIL) (2.5 MG/3ML)  0.083% nebulizer solution 5 mg (5 mg Nebulization Given 11/29/17 0849)     Initial Impression / Assessment and Plan / ED Course  I have reviewed the triage vital signs and the nursing notes.  Pertinent labs & imaging results that were available during my care of the patient were reviewed by me and considered in my medical decision making (see chart for details).     Patient here from out of town visiting and has history of CHF and BNP is elevated as well as his checks x-ray is consistent with that.  Patient will be given 40 mg of Lasix as he recently had his dose increased to this.  He denies any fever or chills but has had increased cough.  Radiologist concerned about possible infection as well.  Will start IV antibiotics and admit to the hospital  Final Clinical Impressions(s) / ED Diagnoses   Final diagnoses:  None    ED Discharge Orders    None       Lacretia Leigh, MD 11/29/17 1114

## 2017-11-29 NOTE — ED Notes (Signed)
Patient transported to X-ray 

## 2017-11-29 NOTE — Progress Notes (Signed)
CCMD called stated pt HR dropped to the 50s, reviewed strip in and out of 2nd degree type 2 block. MD made aware.

## 2017-11-29 NOTE — Progress Notes (Signed)
Pt refusing CPAP for the night. RT will continue to monitor as needed.  

## 2017-11-30 ENCOUNTER — Inpatient Hospital Stay (HOSPITAL_COMMUNITY): Payer: No Typology Code available for payment source

## 2017-11-30 DIAGNOSIS — I110 Hypertensive heart disease with heart failure: Secondary | ICD-10-CM

## 2017-11-30 DIAGNOSIS — R110 Nausea: Secondary | ICD-10-CM

## 2017-11-30 DIAGNOSIS — E119 Type 2 diabetes mellitus without complications: Secondary | ICD-10-CM

## 2017-11-30 DIAGNOSIS — I441 Atrioventricular block, second degree: Secondary | ICD-10-CM

## 2017-11-30 DIAGNOSIS — I340 Nonrheumatic mitral (valve) insufficiency: Secondary | ICD-10-CM

## 2017-11-30 DIAGNOSIS — I351 Nonrheumatic aortic (valve) insufficiency: Secondary | ICD-10-CM

## 2017-11-30 LAB — GLUCOSE, CAPILLARY
GLUCOSE-CAPILLARY: 142 mg/dL — AB (ref 70–99)
Glucose-Capillary: 160 mg/dL — ABNORMAL HIGH (ref 70–99)
Glucose-Capillary: 100 mg/dL — ABNORMAL HIGH (ref 70–99)
Glucose-Capillary: 120 mg/dL — ABNORMAL HIGH (ref 70–99)

## 2017-11-30 LAB — BASIC METABOLIC PANEL
Anion gap: 8 (ref 5–15)
BUN: 16 mg/dL (ref 8–23)
CALCIUM: 9 mg/dL (ref 8.9–10.3)
CO2: 26 mmol/L (ref 22–32)
Chloride: 107 mmol/L (ref 98–111)
Creatinine, Ser: 1.74 mg/dL — ABNORMAL HIGH (ref 0.61–1.24)
GFR calc Af Amer: 44 mL/min — ABNORMAL LOW (ref >60)
GFR calc non Af Amer: 38 mL/min — ABNORMAL LOW (ref >60)
GLUCOSE: 119 mg/dL — AB (ref 70–99)
POTASSIUM: 3.4 mmol/L — AB (ref 3.5–5.1)
Sodium: 141 mmol/L (ref 135–145)

## 2017-11-30 LAB — HIV ANTIBODY (ROUTINE TESTING W REFLEX): HIV Screen 4th Generation wRfx: NONREACTIVE

## 2017-11-30 LAB — ECHOCARDIOGRAM COMPLETE
HEIGHTINCHES: 69 in
Weight: 3688 oz

## 2017-11-30 LAB — TSH: TSH: 1.995 u[IU]/mL (ref 0.350–4.500)

## 2017-11-30 LAB — TROPONIN I: Troponin I: 0.08 ng/mL (ref <0.03)

## 2017-11-30 LAB — MAGNESIUM: MAGNESIUM: 1.8 mg/dL (ref 1.7–2.4)

## 2017-11-30 MED ORDER — PROMETHAZINE HCL 25 MG PO TABS
12.5000 mg | ORAL_TABLET | Freq: Once | ORAL | Status: AC
  Administered 2017-11-30: 12.5 mg via ORAL
  Filled 2017-11-30: qty 1

## 2017-11-30 MED ORDER — POTASSIUM CHLORIDE CRYS ER 20 MEQ PO TBCR
60.0000 meq | EXTENDED_RELEASE_TABLET | Freq: Once | ORAL | Status: AC
  Administered 2017-11-30: 60 meq via ORAL
  Filled 2017-11-30: qty 3

## 2017-11-30 MED ORDER — FUROSEMIDE 10 MG/ML IJ SOLN
40.0000 mg | Freq: Four times a day (QID) | INTRAMUSCULAR | Status: AC
  Administered 2017-11-30 (×2): 40 mg via INTRAVENOUS
  Filled 2017-11-30 (×2): qty 4

## 2017-11-30 MED ORDER — MAGNESIUM SULFATE 2 GM/50ML IV SOLN
2.0000 g | Freq: Once | INTRAVENOUS | Status: AC
  Administered 2017-11-30: 2 g via INTRAVENOUS
  Filled 2017-11-30: qty 50

## 2017-11-30 NOTE — Progress Notes (Addendum)
Paged about an abnormal telemetry reading. Went to evaluate the telemetry showed second degree Mobitz Type II heart block at 8:30 with 5 dropped beats then return to sinus rhythm. When seen in his room patient was sleeping. He denies any symptoms, including fatigue, SOB, chest pain, or pre-syncope at that time. He states that he was laying in bed relaxing when it happened. His exam showed a regular rate and rhythm, distant heart sounds, no murmur appreciated, not on nasal cannula, normal work of breathing, and some diffuse rhonchi. He is now in NSR on telemetry. We reviewed his medication list and it does not appear that he is taking anything that works on the AV node. Will avoid medications that work on AV node including BB and non-DHP CCBs. Ordered a Mg and TSH. Will continue to monitor.  If patient goes into sustained AV block would place pacer pads and notify us.

## 2017-11-30 NOTE — Progress Notes (Addendum)
Subjective:  Jonathan Floyd states that he feels somewhat improved today. He is able to breathe more easily and his abdomen feels less distended, like he is carrying less extra fluid. He was able to walk with the nursing staff wearing O2 and although he does not feel back to baseline, he did note that this was improved from his status on admission. He did not wear the hospital CPAP last night and instead opted for O2 via [place]. This morning he woke up with a headache that was not severe, associated with changes in vision, focal weakness of extremities or other neurological changes. This headache abated after two doses of Tylenol. He also endorsed nausea this morning. During rounds, the patient had just taken phenergan and did not yet know how responsive his nausea would be to this medication. Mr. Dacy denies chest pain or palpitations.    Overnight, the telemetry monitor picked up a run of several consecutive beats in Mobitz Type II Second Degree AV block with dropped beats not proceeded by progressive lengthening of the QRS complex. After this brief stint in AV block, he returned to sinus rhythm with his baseline LBBB. During this time he was asleep and did not wake up, experience any distress or otherwise become symptomatic.   Objective:  Vital signs in last 24 hours: Vitals:   11/29/17 2107 11/30/17 0028 11/30/17 0500 11/30/17 0908  BP: (!) 151/92 115/67 (!) 140/93 120/82  Pulse: 77 71 68 70  Resp: 18 20 20    Temp: 97.7 F (36.5 C) 98.1 F (36.7 C) 98.3 F (36.8 C) 97.7 F (36.5 C)  TempSrc: Oral Oral Oral Oral  SpO2: 97% 95% 98% 98%  Weight:   104.6 kg (230 lb 8 oz)   Height:       Weight change:   Intake/Output Summary (Last 24 hours) at 11/30/2017 1252 Last data filed at 11/30/2017 0900 Gross per 24 hour  Intake 480 ml  Output 750 ml  Net -270 ml   Physical Exam: General: No acute distress, patient resting in bed with wife accompanying in room Pulm: No increased work of  breathing, crackles in bilateral lower lung fields Right>Left  CV: RRR, nMRG appreciated, heart sounds distant, JVD to angle of mandible  Abd: BS+, distended but soft  Ext: no peripheral edema, dorsalis pedis and radial pulses intact  Neuro: no gross defects, patient moves all extremities equally   Assessment/Plan:  Active Problems:   CHF exacerbation ([facility])  CHF Exacerbation:  Mr. Blakeman has a history of CHF, including a recent admission for an exacerbation at a [place] in [place], at which time he had 4L diuresed and presented yesterday with SOB and a hypervolemic state exemplified by his level of JVD to the angle of the mandible. Yesterday, he received 80 mg Lasix and responded will with an output of >1600 mL fluid. Although, because this is his first time in this system, we do not have a reliable "dry weight" for Mr. Salamone and his Cr saw a slight increase from 1.6 on admission to 1.74 after 80 mg Lasix. However, there is still significant JVD, there are crackles in bilateral lung bases, and the patient states that he is not yet back to his baseline respiratory status, indicating the need for continued diuresis today.  -Echocardiography to gain understanding of the type of HF and specific pathology of this patient -Lasix 40 mg IV BID today -Daily BMP while diuresing to monitor Cr, K+ and Na+ and magnesium -will discuss starting  BB after resolution of CHF exacerbation; not on ACE or ARB and at this time due to his renal function would not start -continue hydralazine and isosorbide  Chest Pain:   Resolved; no EKG changes and flat troponins which points against ACS.  CAD: -f/u Echo -continue asa -continue plavix -continue atorvastatin  Hypertension: -continue hydralazine 25 mg  -continue isosorbide dinitrate   T2DM:  Blood Glucose well controlled this admission at 110-140.  -insulin glargine 20 units  -insulin aspart 3 units with meals +SSI   FEN/GI: Heart Healthy diet, monitor and  replete electrolytes as needed K>4 and Mg >2 PPx: Heparin Code: FULL    LOS: 1 day   Rhetta Mura, Medical Student 11/30/2017, 12:52 PM   Attestation for Student Documentation:  I personally was present and performed or re-performed the history, physical exam and medical decision-making activities of this service and have verified that the service and findings are accurately documented in the student's note.  Alphonzo Grieve, MD 11/30/2017, 2:47 PM

## 2017-11-30 NOTE — Progress Notes (Signed)
Echocardiogram 2D Echocardiogram has been performed.  Jonathan Floyd 11/30/2017, 3:35 PM

## 2017-11-30 NOTE — Progress Notes (Signed)
Date: 11/30/2017  Patient name: Jonathan Floyd  Medical record number: [id number]  Date of birth: [birth year]   I have seen and evaluated Sharyn Dross and discussed their care with the Residency Team.  Mr. Fischel is a 72 year old man he receives all of his care at the [place] so we do not have any records available.  He has known heart failure but the details are unknown.  He had been admitted to the [place] about 2 weeks ago for a heart failure exacerbation and reportedly diuresed 4 L.  He also has known coronary artery disease.  He lives in [place] and is here in [place] for a family reunion.  He did not take his fluid pill on the day prior to admission and has been eating foods higher in sodium than normal due to the family reunion.  On the day prior to admission, he developed dyspnea, dyspnea on exertion, productive cough, orthopnea, fatigue, and mild chest pressure.  He presented to the ED and was diagnosed with volume overload secondary to a heart failure exacerbation.  He was given a total of 80 mg of IV Lasix on the day of admission and diuresed net -1 L and his weight is reportedly down 4 pounds to 230.  He does not weigh himself at home although he has in the past, so we do not know his home weight.  This morning, he feels improved but is not yet back to baseline.  He still feels like he has extra volume including in his abdomen.   Vitals:   11/30/17 0500 11/30/17 0908  BP: (!) 140/93 120/82  Pulse: 68 70  Resp: 20   Temp: 98.3 F (36.8 C) 97.7 F (36.5 C)  SpO2: 98% 98%  Lying in bed with Martha O2 HRRR no MRG L crackles at bases B, otherwise good airflow + JVD to mandibular angle Ext no edema  Cr 1.61 - 1.74 Trop I 0.08 x3 BNP 526 TSH nl  I personally viewed the CXR images and confirmed my reading with the official read. Slight rotation, B interstitial edema with Awanda Mink B's B  I personally viewed the EKG and confirmed my reading with the official read. Sinus, LBBB  Assessment and  Plan: I have seen and evaluated the patient as outlined above. I agree with the formulated Assessment and Plan as detailed in the residents' note, with the following changes:  Mr. Doolan is a 72 year old male pt of the [place] who has known CAD and "heart failure" but the details are not known. While in [place] for a family reunion, he did not take his Lasix and had dietary indiscretions and developed symptoms consistent with heart failure.  His examination, chest x-ray, and BNP all support the diagnosis of heart failure.  He has diuresed since admission and has partial response but continues to have findings consistent with volume overload so we will continue diuresis.  1.  Acute on chronic heart failure, details unknown as we do not have the [place] records -he had a good response to his IV Lasix on the day of admission and we are continuing his diuresis today.  His creatinine did increase a little bit with diuresis so we will recheck his creatinine in the morning.  We are obtaining an echocardiogram to further clarify his heart failure.  He is not on an ACE inhibitor nor a beta-blocker at home.  Without knowing his baseline creatinine and ensuring that he has close follow-up, it would be difficult for [place] to initiate an  ACE inhibitor.  Due to his CAD, he would benefit from a beta-blocker echo.  2. Chronic CAD -we will start a daily beta-blocker once he is euvolemic.  We are continuing his aspirin, Plavix, and statin.  3.  Hypertension -we will continue his hydralazine and isosorbide dinitrate.  His blood pressure is slightly elevated but we anticipate further diuresis and adding a beta-blocker once he is euvolemic.  4.  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  He did have a slight increase in his creatinine with diuresis and we will be rechecking this in the morning.  5.  OSA -we encouraged him to use his CPAP tonight as he  woke up with a headache this morning.  Plus it would also help with his hemodynamics status.  Bartholomew Crews, MD 7/15/20192:59 PM

## 2017-11-30 NOTE — Progress Notes (Signed)
Patient had an uneventful night with rest, Gave IV Magnesium and Po Potassium.

## 2017-12-01 ENCOUNTER — Encounter (HOSPITAL_COMMUNITY): Payer: Self-pay | Admitting: General Practice

## 2017-12-01 DIAGNOSIS — M109 Gout, unspecified: Secondary | ICD-10-CM

## 2017-12-01 DIAGNOSIS — I5020 Unspecified systolic (congestive) heart failure: Secondary | ICD-10-CM

## 2017-12-01 LAB — BASIC METABOLIC PANEL
Anion gap: 8 (ref 5–15)
BUN: 15 mg/dL (ref 8–23)
CO2: 27 mmol/L (ref 22–32)
Calcium: 9.4 mg/dL (ref 8.9–10.3)
Chloride: 107 mmol/L (ref 98–111)
Creatinine, Ser: 1.9 mg/dL — ABNORMAL HIGH (ref 0.61–1.24)
GFR calc Af Amer: 40 mL/min — ABNORMAL LOW (ref >60)
GFR calc non Af Amer: 34 mL/min — ABNORMAL LOW (ref >60)
Glucose, Bld: 120 mg/dL — ABNORMAL HIGH (ref 70–99)
POTASSIUM: 3.7 mmol/L (ref 3.5–5.1)
SODIUM: 142 mmol/L (ref 135–145)

## 2017-12-01 LAB — GLUCOSE, CAPILLARY
Glucose-Capillary: 116 mg/dL — ABNORMAL HIGH (ref 70–99)
Glucose-Capillary: 128 mg/dL — ABNORMAL HIGH (ref 70–99)

## 2017-12-01 LAB — MAGNESIUM: Magnesium: 2.1 mg/dL (ref 1.7–2.4)

## 2017-12-01 MED ORDER — CARVEDILOL 3.125 MG PO TABS: 3.1250 mg | ORAL_TABLET | Freq: Two times a day (BID) | ORAL | 1 refills | Status: DC

## 2017-12-01 MED ORDER — POTASSIUM CHLORIDE CRYS ER 20 MEQ PO TBCR
60.0000 meq | EXTENDED_RELEASE_TABLET | Freq: Once | ORAL | Status: AC
  Administered 2017-12-01: 60 meq via ORAL
  Filled 2017-12-01: qty 3

## 2017-12-01 NOTE — Consult Note (Signed)
            [facility] CM Primary Care Navigator  12/01/2017  Jonathan Floyd 1946-03-06 [id number]   Went to see patient at the bedsideto identify possible discharge needs but provider and other staff are in the room examining patient at the moment.  Will attempt to return back at another time when he is available in the room.    AddendumMartin Majestic to seepatient and wife at the bedsideto identify possible discharge needs.  Perchart review, patient was diagnosed with volume overload secondary to a heart failure exacerbation that resulted to this admission. Per MD note, he receives all of his care at the [facility] Patient lives in [place] and is here in [place] for a family reunion.  Patient's wife reportsthatprimary care provider was Dr. Ace Gins (no longer there) but is currently seeing Dr. Bronwen Betters with [facility] in [place] is not under [facility] network.  Patient/ wife was encouraged and agreed to follow-up with hisprimary care providerafter hospital discharge or when hereturns back home in order to help managehishealth issues.    For additional questions please contact:  Edwena Felty A. Angello Chien, BSN, RN-BC [facility] PRIMARY CARE Navigator Cell: [phone]

## 2017-12-01 NOTE — Progress Notes (Signed)
  Date: 12/01/2017  Patient name: Jonathan Floyd  Medical record number: [id number]  Date of birth: [birth year]   I have seen and evaluated this patient and I have discussed the plan of care with the house staff. Please see their note for complete details. I concur with their findings with the following additions/corrections: Jonathan Floyd was seen on AM rounds with team. Family was at bedside. Pt able to walk entire length of hallway and back. Cr bumped a bit indicating he is euvolemic if not a bit dry. Agree with starting BB as euvolemic and has systolic HF and CAD. Cannot start ACE due to renal fxn - [facility]. Stable for D/C home.  Bartholomew Crews, MD 12/01/2017, 3:56 PM

## 2017-12-01 NOTE — Discharge Summary (Addendum)
Name: Jonathan Floyd MRN: [id number] DOB: 1946/05/09 72 y.o. PCP: System, Pcp Not In  Date of Admission: 11/29/2017  8:33 AM Date of Discharge: 12/01/2017 Attending Physician: Dr. Larey Dresser  Discharge Diagnosis: 1. CHF exacerbation  2. CKD stage 3 3. HTN  Discharge Medications: Allergies as of 12/01/2017      Reactions   Iodine    Only has one kidney      Medication List    STOP taking these medications   amLODipine 10 MG tablet Commonly known as:  NORVASC     TAKE these medications   acetaminophen 500 MG tablet Commonly known as:  TYLENOL Take 1,000 mg by mouth as needed for mild pain or headache.   allopurinol 300 MG tablet Commonly known as:  ZYLOPRIM Take 300 mg by mouth daily.   aspirin EC 81 MG tablet Take 81 mg by mouth daily.   atorvastatin 10 MG tablet Commonly known as:  LIPITOR Take 10 mg by mouth daily at 6 PM.   capsicum 0.075 % topical cream Commonly known as:  ZOSTRIX Apply 1 application topically as needed (foot pain).   carvedilol 3.125 MG tablet Commonly known as:  COREG Take 1 tablet (3.125 mg total) by mouth 2 (two) times daily. What changed:    medication strength  how much to take  when to take this  additional instructions   clopidogrel 75 MG tablet Commonly known as:  PLAVIX Take 75 mg by mouth daily.   ergocalciferol 50000 units capsule Commonly known as:  VITAMIN D2 Take 50,000 Units by mouth 2 (two) times a week.   finasteride 5 MG tablet Commonly known as:  PROSCAR Take 5 mg by mouth daily.   guaiFENesin 200 MG tablet Take 400 mg by mouth 3 (three) times daily.   hydrALAZINE 50 MG tablet Commonly known as:  APRESOLINE Take 25 mg by mouth 3 (three) times daily.   hydroxypropyl methylcellulose / hypromellose 2.5 % ophthalmic solution Commonly known as:  ISOPTO TEARS / GONIOVISC Place 1 drop into both eyes as needed for dry eyes.   insulin glargine 100 UNIT/ML injection Commonly known as:  LANTUS Inject  30 Units into the skin at bedtime.   isosorbide dinitrate 10 MG tablet Commonly known as:  ISORDIL Take 20 mg by mouth 3 (three) times daily.   nitroGLYCERIN 0.4 MG SL tablet Commonly known as:  NITROSTAT Place 0.4 mg under the tongue every 5 (five) minutes as needed for chest pain.   potassium & sodium phosphates 280-160-250 MG Pack Commonly known as:  PHOS-NAK Take 1 packet by mouth 2 (two) times daily.   torsemide 20 MG tablet Commonly known as:  DEMADEX Take 20 mg by mouth daily.       Disposition and follow-up:   JonathanJonathan Floyd was discharged from [facility] in fair condition.  At the hospital follow up visit please address:  1.  Medication adjustments- Carvedilol re-initiated at 3.125 mg BID, amlodipine discontinued given sufficient control with other agents   2.  Labs / imaging needed at time of follow-up: BMP (Cr, K+), Echo (now shows EF 20-25% - decreased from previous Echo per pt report)   3.  Pending labs/ test needing follow-up: none   Follow-up Appointments: Follow-up Information    Please follow up.   Why:  [facility] Course by problem list: Mr. Jonathan Floyd is a 72 yoM with a history of CHF, diabetes, htn, 3  stents placed in coronary arteries with the last one being placed in the proximal LAD in 2017, remote history of cancer s/p nephrectomy in 1976, who presents with SOB, cough and mild chest pain for 1 day.  1. Congestive Heart Failure: Jonathan Floyd was admitted for CHF exacerbation in the setting of going on vacation, missing a dose of his torsemide and dietary indiscretion on the road. He was compliant with other medications except had confusion over taking carvedilol (thought he had to hold it for blood pressures ABOVE 110) so had not been on BB for about 6 months. On admission his BNP was 526, he was SOB with abdominal distention but little peripheral edema. A chest x-ray in the ED was significant for pulmonary edema and  for LLL consolidation that was interpreted as pna vs consolidated edema. The patient received one dose of Ceftriaxone and Azithromycin in the ED. However, his white count was normal, he remained afebrile throughout admission and antibiotics were not continued past the first dose. Jonathan Floyd was diuresed with IV lasix for 2 days with improvement in symptoms. His Cr did rise from 1.61 on admit to 1.9 on day three.  An Echo performed on day two of admission revealed an EF 20-25%. This is decreased from the patient's last Echo, which the family reported as having suggesting an EF 30%. On discharge his torsemide 20mg  daily, hydralazine 25mg  TID and isosorbide 20mg  TID. He was started on carvedilol 3.125mg  BID, which should be titrated on follow up. His discharge weight was 229lbs. He was advised to follow up with his cardiologist within a week. Depending on his prior echo reports, he may be a candidate for ICD placement.  2. Hypertension: During admission, amlodipine was held while the patient was continued on hydralazine and isosorbide dinitrate. Furthermore, the patient stated that he had not taken his Carvedilol in 6 months and this medication was held in the setting of CHF exacerbation. The patient's BPs were largely normotensive during admission and the amlodipine will be discontinued on discharge while hydralazine and isosorbide dinitrate were continued and Carvedilol was re-initiated at 3.125 mg to be titrated up as an outpatient.     3. Diabetes:  The patient's BG ranged from 110-140 during the admission on 20 units Lantus and 3 units with meals. Resume home :am of 30 units on discharge.   4. Chest Pain:  The patient presented with chest pain. He had an EKG, which revealed a LBBB but no acute ischemia evident and 3 troponins, which came back as 0.08. The elevated but consistent troponin suggests renal difficulty clearing this enzyme vs chronic myocardial enzyme leak vs damage due to acute ischemia. The  chest pain improved without intervention during the patient's hospitalization.   5. History of BPH and Gout:  The patient was maintained on 5 mg finasteride and 300 mg allopurinol throughout admission.   Discharge Vitals:   BP 99/73 (BP Location: Right Arm)   Pulse 75   Temp 97.8 F (36.6 C) (Oral)   Resp 20   Ht 5\' 9"  (1.753 m)   Wt 229 lb 14.4 oz (104.3 kg) Comment: scale a  SpO2 97%   BMI 33.95 kg/m   Pertinent Labs, Studies, and Procedures:   Echocardiogram: Left ventricle: The cavity size was mildly dilated. Wall   thickness was increased in a pattern of moderate LVH. Systolic   function was severely reduced. The estimated ejection fraction   was in the range of 20% to 25%. Diffuse hypokinesis. Doppler  parameters are consistent with abnormal left ventricular   relaxation (grade 1 diastolic dysfunction). Doppler parameters   are consistent with high ventricular filling pressure. - Aortic valve: There was mild regurgitation. - Mitral valve: There was mild regurgitation. - Left atrium: The atrium was mildly dilated. - Pulmonary arteries: Systolic pressure was mildly increased. PA   peak pressure: 39 mm Hg (S).  Chest X-ray Findings consistent with mild congestive heart failure with patchy airspace opacity in left lower lobe representative of superimposed pneumonia vs. consolidation of edema   EKG: LBBB- no evidence of acute ischemia, sinus rhythm   CBC Latest Ref Rng & Units 11/29/2017  WBC 4.0 - 10.5 K/uL 7.0  Hemoglobin 13.0 - 17.0 g/dL 14.5  Hematocrit 39.0 - 52.0 % 47.6  Platelets 150 - 400 K/uL 157   BMP Latest Ref Rng & Units 12/01/2017 11/30/2017 11/29/2017  Glucose 70 - 99 mg/dL 120(H) 119(H) 139(H)  BUN 8 - 23 mg/dL 15 16 13   Creatinine 0.61 - 1.24 mg/dL 1.90(H) 1.74(H) 1.61(H)  Sodium 135 - 145 mmol/L 142 141 140  Potassium 3.5 - 5.1 mmol/L 3.7 3.4(L) 4.0  Chloride 98 - 111 mmol/L 107 107 109  CO2 22 - 32 mmol/L 27 26 24   Calcium 8.9 - 10.3 mg/dL 9.4 9.0  8.8(L)   Discharge Instructions: Discharge Instructions    (HEART FAILURE PATIENTS) Call MD:  Anytime you have any of the following symptoms: 1) 3 pound weight gain in 24 hours or 5 pounds in 1 week 2) shortness of breath, with or without a dry hacking cough 3) swelling in the hands, feet or stomach 4) if you have to sleep on extra pillows at night in order to breathe.   Complete by:  As directed    Call MD for:  difficulty breathing, headache or visual disturbances   Complete by:  As directed    Call MD for:  extreme fatigue   Complete by:  As directed    Call MD for:  hives   Complete by:  As directed    Call MD for:  persistant dizziness or light-headedness   Complete by:  As directed    Call MD for:  persistant nausea and vomiting   Complete by:  As directed    Call MD for:  redness, tenderness, or signs of infection (pain, swelling, redness, odor or green/yellow discharge around incision site)   Complete by:  As directed    Call MD for:  severe uncontrolled pain   Complete by:  As directed    Call MD for:  temperature >100.4   Complete by:  As directed    Diet - low sodium heart healthy   Complete by:  As directed    Discharge instructions   Complete by:  As directed    You were in the hospital for a heart failure exacerbation. During your hospitalization you had an ultrasound of your heart that showed that your heart was pumping at about 20-25% (normal value is about 50%). We discussed ways to help decrease you chances of having fluid build up, like taking your fluid pills regularly, limiting your salt intake to less than 2 grams per day, and limiting your total fluid intake to less than 64 ounces per day.   Weigh yourself daily; if you notice a weight gain of more than 3lbs in one day or 5lbs in one week, call you doctor and they may instruct you to adjust your medicines. This may avoid another hospitalization.  Your "dry weight" on discharge from [facility] is 229lbs.   Start  taking carvedilol 3.125mg  twice daily with meals. This is a much lower dose than your previous prescription, so make sure to pick up and take the correct one.   Please schedule an appointment as soon as possible with your cardiologist at the [facility]. They may continue adjusting your medications and may plan on doing another ultrasound of your heart in a few months. They should also check your kidney function at that visit.   Heart Failure patients record your daily weight using the same scale at the same time of day   Complete by:  As directed    Increase activity slowly   Complete by:  As directed       Signed: Alphonzo Grieve, MD 12/03/2017, 6:22 PM

## 2017-12-01 NOTE — Consult Note (Deleted)
            [facility] CM Primary Care Navigator  12/01/2017  Jonathan Floyd September 03, 1945 [id number]   Went to see patient and wife at the bedsideto identify possible discharge needs.  Per chart review, patient was diagnosed with volume overload secondary to a heart failure exacerbation that resulted to this admission. Per MD note, he receives all of his care at the [facility]). Patient lives in [place] and is here in [place] for a family reunion.  Patient's wife reports thatprimary care provider was Dr. Ace Gins (no longer there) but is currently seeing Dr. Bronwen Betters with [facility] in [place] which is not under [facility] network.  Patient/ wife was encouraged and agreed to follow-up with his primary care providerafter hospital discharge or when  he returns back home in order to help managehishealth issues.   For additional questions please contact:  Edwena Felty A. Ruston Fedora, BSN, RN-BC [facility] PRIMARY CARE Navigator Cell: [phone]

## 2017-12-01 NOTE — Progress Notes (Addendum)
Subjective:  Jonathan Floyd once again reports improvement in his symptoms this morning. He states that he was able to walk all the way down the hall and back yesterday and that he has felt on balance and mobile to ambulate to the bathroom. He does state that since he has been lying in a bed for the past few days, he is probably a bit weak. He does not endorse SOB, cough, chest pain or palpitations.   Objective:  Vital signs in last 24 hours: Vitals:   12/01/17 0412 12/01/17 0415 12/01/17 0806 12/01/17 1116  BP: 121/61  112/78 99/73  Pulse: 76 95 72 75  Resp: (!) 26  (!) 24 20  Temp: 98.6 F (37 C)  (!) 97.4 F (36.3 C) 97.8 F (36.6 C)  TempSrc: Oral  Oral Oral  SpO2: 97% 97% 99% 97%  Weight: 104.3 kg (229 lb 14.4 oz)     Height:       Weight change: -6.396 kg (-14 lb 1.6 oz)  Intake/Output Summary (Last 24 hours) at 12/01/2017 1159 Last data filed at 12/01/2017 0919 Gross per 24 hour  Intake 712 ml  Output 950 ml  Net -238 ml   Physical Exam: General: No acute distress, patient resting in bed with wife accompanying in room Pulm: No increased work of breathing, lungs CAB  CV: RRR, nMRG appreciated, heart sounds distant   Abd: BS+, abdomen much less distended, soft, non-tender Ext: no peripheral edema, dorsalis pedis and radial pulses intact  Neuro: no gross defects, patient moves all extremities equally   Assessment/Plan:  Active Problems:   CHF exacerbation (HCC)  CHF Exacerbation:  Jonathan Floyd has a history of CHF and on admission was in a hypervolemic state exemplified by his level of JVD to the angle of the mandible. Since admission, he has diuresed at least 2.6 L and is down 5-6 lbs. He denies SOB, has no peripheral edema and his abdomen is significantly less tense and distended. In addition to these findings, his Cr increased from 1.6 on admission to 1.9, suggesting that the patient no does not have excess volume to diurese intravascularly. Yesterday, an Echocardiogram  was performed, and revealed an EF 20-25%. Without a baseline for the patient, it is difficult to put this result into context as a stable element of a chronic disease or an acutely worsening parameter that could be prognosis informing. Given his stable vitals and improvement clinically, the patient is likely ready to be discharged with close outpatient follow up with [facility].   -echo report provided for patient to present to his PCP and cardiologist  -Hold Lasix today given increased Cr, patient can resume Torsemide at 20 mg home dose tomorrow  -Restart Carvedilol at 3.125 mg- titrate with outpatient Cardiologist -continue hydralazine and isosorbide -continue to hold ACE/ ARB given Renal Function   CAD: -continue asa -continue plavix -continue atorvastatin  Hypertension: Blood pressure has been well controlled  -continue hydralazine 25 mg  -continue isosorbide dinitrate  -starting low dose carvedilol -hold home amlodipine for now  T2DM:  Blood Glucose well controlled this admission at 110-140.  -Resume outpatient Insulin regimen on discharge   Anticipate discharge: later today     LOS: 2 days   Jonathan Floyd, Medical Student 12/01/2017, 11:59 AM   Attestation for Student Documentation:  I personally was present and performed or re-performed the history, physical exam and medical decision-making activities of this service and have verified that the service and findings are accurately documented  in the student's note.  Alphonzo Grieve, MD 12/01/2017, 7:33 PM

## 2017-12-01 NOTE — Progress Notes (Signed)
Pharmacist Heart Failure Core Measure Documentation  Assessment: Jonathan Floyd has an EF documented as 20-25% on 11/30/17 by ECHO.  Rationale: Heart failure patients with left ventricular systolic dysfunction (LVSD) and an EF < 40% should be prescribed an angiotensin converting enzyme inhibitor (ACEI) or angiotensin receptor blocker (ARB) at discharge unless a contraindication is documented in the medical record.  This patient is not currently on an ACEI or ARB for HF.  This note is being placed in the record in order to provide documentation that a contraindication to the use of these agents is present for this encounter.  ACE Inhibitor or Angiotensin Receptor Blocker is contraindicated (specify all that apply)  []   ACEI allergy AND ARB allergy []   Angioedema []   Moderate or severe aortic stenosis []   Hyperkalemia []   Hypotension []   Renal artery stenosis [x]   Worsening renal function, preexisting renal disease or dysfunction   Jonathan Floyd, PharmD, BCPS, [place] 12/01/2017, 12:44 PM

## 2017-12-01 NOTE — Progress Notes (Signed)
Pt asleep with no discomfort, wife with pt at bedside, she spent the night.

## 2017-12-01 NOTE — Progress Notes (Signed)
Pt is alert and oriented with pain in right lower back that's rate 3 0-10 with worsen when laying flat in bed. Will make Md aware.

## 2017-12-01 NOTE — Discharge Instructions (Signed)
Fluid Restriction Some health conditions may require you to restrict your fluid intake. This means that you need to limit the amount of fluid you drink each day. When you have a fluid restriction, you must carefully measure and keep track of the amount of fluid you drink. Your health care provider will identify the specific amount of fluid you are allowed each day. This amount may depend on several things, such as:  The amount of urine you produce in a day.  How much fluid you are keeping (retaining) in your body.  Your blood pressure.  What is my plan? Your health care provider recommends that you limit your fluid intake to 64 ounces per day. What counts toward my fluid intake? Your fluid intake includes all liquids that you drink, as well as any foods that become liquid at room temperature. The following are examples of some fluids that you will have to restrict:  Tea, coffee, soda, lemonade, milk, water, juice, sport drinks, and nutritional supplement beverages.  Alcoholic beverages.  Cream.  Gravy.  Ice cubes.  Soup and broth.  The following are examples of foods that become liquid at room temperature. These foods will also count toward your fluid intake.  Ice cream and ice milk.  Frozen yogurt and sherbet.  Frozen ice pops.  Flavored gelatin.  How do I keep track of my fluid intake? Each morning, fill a jug with the amount of water that equals the amount of fluid you are allowed for the day. You can use this water as a guideline for fluid allowance. Each time you take in any form of fluid, including ice cubes and foods that become liquid at room temperature, pour an equal amount of water out of the container. This helps you to see how much fluid you are taking in. It also helps you to see how much of your fluid intake is left for the rest of the day. The following conversions may also be helpful in measuring your fluid intake:  1 cup equals 8 oz (240 mL).   cup equals  6 oz (180 mL).  ? cup equals 5? oz (160 mL).   cup equals 4 oz (120 mL).  ? cup equals 2? oz (80 mL).   cup equals 2 oz (60 mL).  2 Tbsp equals 1 oz (30 mL).  What home care instructions should I follow while restricting fluids?  Make sure that you stay within the recommended limit each day. Always measure and keep track of your fluids, as well as any foods that turn liquid at room temperature.  Use small cups and glasses and learn to sip fluids slowly.  Add a slice of fresh lemon or lemon juice to water or ice. This helps to satisfy your thirst.  Freeze fruit juice or water in an ice cube tray. Use this as part of your fluid allowance. These cubes are useful for quenching your thirst. Measure the amount of liquid in each ice cube prior to freezing so you can subtract this amount from your days allowance when you consume each frozen cube.  Try frozen fruits between meals, such as grapes or strawberries.  Swallow your pills along with meals or soft foods, such as applesauce or mashed potatoes. This helps you to save your fluid allowance for something that you enjoy.  Weigh yourself every day. Keeping track of your daily weight can help you and your health care provider to notice as soon as possible if you are retaining too much  fluid in your body. ? Weigh yourself every morning after you urinate but before you eat breakfast. ? Wear the same amount of clothing each time you weigh yourself. ? Write down your daily weight. Give this weight record to your health care provider. If your weight is going up, you may be retaining too much fluid. Every 2 cups (480 mL) of fluid retained in the body becomes an extra 1 lb (0.45 kg) of body weight.  Avoid salty foods. These foods make you thirsty and make fluid control more difficult.  Brush your teeth often or rinse your mouth with mouthwash to help your dry mouth. Lemon wedges, hard sour candies, chewing gum, or breath spray may also help to  moisten your mouth.  Keep the temperature in your home at a cooler level. Dry air increases thirst, so keep the air in your home as humid as possible.  Avoid being out in the hot sun, which can cause you to sweat and become thirsty. What are some signs that I may be taking in too much fluid? You may be taking in too much fluid if:  Your weight increases. Contact your health care provider if your weight increases 3 lb or more in a day or if it increases 5 lb or more in a week.  Your face, hands, legs, feet, and belly (abdomen) start to swell.  You have trouble breathing.  This information is not intended to replace advice given to you by your health care provider. Make sure you discuss any questions you have with your health care provider. Document Released: 03/02/2007 Document Revised: 10/11/2015 Document Reviewed: 10/04/2013 Elsevier Interactive Patient Education  [employer].

## 2017-12-01 NOTE — Progress Notes (Signed)
Pt is alert and oriented with daughter and wife at the bedside. Discharge and prescription given.waiting on patient to get dressed.

## 2018-01-04 ENCOUNTER — Encounter: Payer: Self-pay | Admitting: Cardiovascular Disease

## 2018-01-21 ENCOUNTER — Encounter: Payer: Self-pay | Admitting: *Deleted

## 2018-01-21 ENCOUNTER — Ambulatory Visit (INDEPENDENT_AMBULATORY_CARE_PROVIDER_SITE_OTHER): Payer: Non-veteran care | Admitting: Cardiology

## 2018-01-21 ENCOUNTER — Encounter: Payer: Self-pay | Admitting: Cardiology

## 2018-01-21 VITALS — BP 139/79 | HR 72 | Ht 69.0 in | Wt 232.4 lb

## 2018-01-21 DIAGNOSIS — I5022 Chronic systolic (congestive) heart failure: Secondary | ICD-10-CM

## 2018-01-21 DIAGNOSIS — I10 Essential (primary) hypertension: Secondary | ICD-10-CM | POA: Diagnosis not present

## 2018-01-21 DIAGNOSIS — I2510 Atherosclerotic heart disease of native coronary artery without angina pectoris: Secondary | ICD-10-CM

## 2018-01-21 DIAGNOSIS — E782 Mixed hyperlipidemia: Secondary | ICD-10-CM

## 2018-01-21 MED ORDER — CARVEDILOL 12.5 MG PO TABS: 18.7500 mg | ORAL_TABLET | Freq: Two times a day (BID) | ORAL | 1 refills | Status: DC

## 2018-01-21 NOTE — Patient Instructions (Signed)
Your physician recommends that you schedule a follow-up appointment in: [place] has recommended you make the following change in your medication:   INCREASE COREG 18.75 MG (1 AND 1/2 TABLETS) TWICE DAILY   Thank you for choosing [facility]!!

## 2018-01-21 NOTE — Progress Notes (Signed)
Clinical Summary Jonathan Floyd is a 72 y.o.male seen as new patient, previously followed at [facility] cardiology.   1. Chronic systolic HF - echo 11/2618 LVEF 35-59%, grade I diastolic dysfunction - admit 11/2017 with fluid overload with medication and dietaery noncompliance.  - approx 6-7 year history. They think LVEF 30-35% a few years ago.   - chronic SOB. No recent edema. - compliant with meds. Limiting sodium intake. Avoiding NSAIDs - he reports one previous conversation about ICD several years ago.    2. History of CAD - history of prior cardiac stents done through [place]. Last in 2017.  - he has a stent card: Nov 2017 xience to prox LAD - EKG with LBBB  - just 1-2 episodes of chest pain in August. Mild, just a few seconds. Not exertional.    3. HTN - compliant with meds  4. Hyperlipidemia - on atorvstatin 10mg  daily.   5. CKD III - last Cr in our system is 1.9.  - history of nephrectomy.  - recently referred to Dr Hinda Lenis nephrology    Past Medical History:  Diagnosis Date  . Cancer ([place])    43 years ago- kidney taken out for it  . CHF (congestive heart failure) ([place])   . Coronary artery disease   . Hypertension   . Renal disorder    1 kidney     Allergies  Allergen Reactions  . Iodine     Only has one kidney     Current Outpatient Medications  Medication Sig Dispense Refill  . acetaminophen (TYLENOL) 500 MG tablet Take 1,000 mg by mouth as needed for mild pain or headache.    . allopurinol (ZYLOPRIM) 300 MG tablet Take 300 mg by mouth daily.    Marland Kitchen aspirin EC 81 MG tablet Take 81 mg by mouth daily.    Marland Kitchen atorvastatin (LIPITOR) 10 MG tablet Take 10 mg by mouth daily at 6 PM.    . capsicum (ZOSTRIX) 0.075 % topical cream Apply 1 application topically as needed (foot pain).    . carvedilol (COREG) 3.125 MG tablet Take 1 tablet (3.125 mg total) by mouth 2 (two) times daily. 60 tablet 1  . clopidogrel (PLAVIX) 75 MG tablet Take 75 mg by mouth daily.    .  ergocalciferol (VITAMIN D2) 50000 units capsule Take 50,000 Units by mouth 2 (two) times a week.    . finasteride (PROSCAR) 5 MG tablet Take 5 mg by mouth daily.    Marland Kitchen guaiFENesin 200 MG tablet Take 400 mg by mouth 3 (three) times daily.    . hydrALAZINE (APRESOLINE) 50 MG tablet Take 25 mg by mouth 3 (three) times daily.    . hydroxypropyl methylcellulose / hypromellose (ISOPTO TEARS / GONIOVISC) 2.5 % ophthalmic solution Place 1 drop into both eyes as needed for dry eyes.    . insulin glargine (LANTUS) 100 UNIT/ML injection Inject 30 Units into the skin at bedtime.    . isosorbide dinitrate (ISORDIL) 10 MG tablet Take 20 mg by mouth 3 (three) times daily.    . nitroGLYCERIN (NITROSTAT) 0.4 MG SL tablet Place 0.4 mg under the tongue every 5 (five) minutes as needed for chest pain.    . potassium & sodium phosphates (PHOS-NAK) 280-160-250 MG PACK Take 1 packet by mouth 2 (two) times daily.    Marland Kitchen torsemide (DEMADEX) 20 MG tablet Take 20 mg by mouth daily.     No current facility-administered medications for this visit.  Past Surgical History:  Procedure Laterality Date  . CORONARY ANGIOPLASTY WITH STENT PLACEMENT    . KNEE ARTHROPLASTY    . NEPHRECTOMY  1976     Allergies  Allergen Reactions  . Iodine     Only has one kidney      No family history on file.   Social History Jonathan Floyd reports that he has quit smoking. He has never used smokeless tobacco. Jonathan Floyd reports that he drinks alcohol.   Review of Systems CONSTITUTIONAL: No weight loss, fever, chills, weakness or fatigue.  HEENT: Eyes: No visual loss, blurred vision, double vision or yellow sclerae.No hearing loss, sneezing, congestion, runny nose or sore throat.  SKIN: No rash or itching.  CARDIOVASCULAR: per hpi RESPIRATORY: No shortness of breath, cough or sputum.  GASTROINTESTINAL: No anorexia, nausea, vomiting or diarrhea. No abdominal pain or blood.  GENITOURINARY: No burning on urination, no  polyuria NEUROLOGICAL: No headache, dizziness, syncope, paralysis, ataxia, numbness or tingling in the extremities. No change in bowel or bladder control.  MUSCULOSKELETAL: No muscle, back pain, joint pain or stiffness.  LYMPHATICS: No enlarged nodes. No history of splenectomy.  PSYCHIATRIC: No history of depression or anxiety.  ENDOCRINOLOGIC: No reports of sweating, cold or heat intolerance. No polyuria or polydipsia.  Marland Kitchen   Physical Examination Vitals:   01/21/18 1431  BP: 139/79  Pulse: 72  SpO2: 97%   Vitals:   01/21/18 1431  Weight: 232 lb 6.4 oz (105.4 kg)  Height: 5\' 9"  (1.753 m)    Gen: resting comfortably, no acute distress HEENT: no scleral icterus, pupils equal round and reactive, no palptable cervical adenopathy,  CV: RRR, no m/r/g, no jvd Resp: Clear to auscultation bilaterally GI: abdomen is soft, non-tender, non-distended, normal bowel sounds, no hepatosplenomegaly MSK: extremities are warm, no edema.  Skin: warm, no rash Neuro:  no focal deficits Psych: appropriate affect    Assessment and Plan  1. Chronic systolic HF - requesting records from [place], history still somewhat unclear - in setting of systolic HF and elevated bp increase coreg to 18.75mg  bid.  - medical therapy limited by renal dysfunction, not on ACEI/ARB/aldactone/arni - unclear what ICD discussion/decision was made in the past. With his LVEF and LBBB possible candidate for CRT-D. Request records and consider possible EP referral  2. CAD - no recent symptoms - continue current meds, f/u records  3. HTN - above goal of <130/80, increase coreg to 18.75mg  bid  4. Hyperlipidemia - f/u labs from [place]. May consider higher dose statin in setting known CAD  5. CKD 3 - avoid nephrotoxic drugs - has upcoming appt with renal   F/u 1 month. Further CHF medication titration at that time, would increase coreg further if able.    Arnoldo Lenis, M.D.

## 2018-02-15 DIAGNOSIS — M109 Gout, unspecified: Secondary | ICD-10-CM | POA: Insufficient documentation

## 2018-02-15 DIAGNOSIS — E1121 Type 2 diabetes mellitus with diabetic nephropathy: Secondary | ICD-10-CM | POA: Insufficient documentation

## 2018-02-15 DIAGNOSIS — N183 Chronic kidney disease, stage 3 unspecified: Secondary | ICD-10-CM | POA: Insufficient documentation

## 2018-02-15 DIAGNOSIS — I2510 Atherosclerotic heart disease of native coronary artery without angina pectoris: Secondary | ICD-10-CM | POA: Insufficient documentation

## 2018-02-15 DIAGNOSIS — N400 Enlarged prostate without lower urinary tract symptoms: Secondary | ICD-10-CM | POA: Insufficient documentation

## 2018-02-15 DIAGNOSIS — I5020 Unspecified systolic (congestive) heart failure: Secondary | ICD-10-CM | POA: Insufficient documentation

## 2018-02-15 DIAGNOSIS — I10 Essential (primary) hypertension: Secondary | ICD-10-CM | POA: Insufficient documentation

## 2018-02-23 ENCOUNTER — Other Ambulatory Visit (HOSPITAL_COMMUNITY): Payer: Self-pay | Admitting: Nephrology

## 2018-02-23 DIAGNOSIS — N183 Chronic kidney disease, stage 3 unspecified: Secondary | ICD-10-CM

## 2018-02-24 ENCOUNTER — Encounter: Payer: Self-pay | Admitting: *Deleted

## 2018-02-24 ENCOUNTER — Ambulatory Visit (INDEPENDENT_AMBULATORY_CARE_PROVIDER_SITE_OTHER): Payer: No Typology Code available for payment source | Admitting: Physician Assistant

## 2018-02-24 ENCOUNTER — Encounter: Payer: Self-pay | Admitting: Physician Assistant

## 2018-02-24 VITALS — BP 138/78 | HR 62 | Ht 69.0 in | Wt 237.0 lb

## 2018-02-24 DIAGNOSIS — I5022 Chronic systolic (congestive) heart failure: Secondary | ICD-10-CM | POA: Diagnosis not present

## 2018-02-24 DIAGNOSIS — I429 Cardiomyopathy, unspecified: Secondary | ICD-10-CM | POA: Diagnosis not present

## 2018-02-24 DIAGNOSIS — I10 Essential (primary) hypertension: Secondary | ICD-10-CM | POA: Diagnosis not present

## 2018-02-24 DIAGNOSIS — R0609 Other forms of dyspnea: Secondary | ICD-10-CM | POA: Diagnosis not present

## 2018-02-24 MED ORDER — CARVEDILOL 12.5 MG PO TABS: 12.5000 mg | ORAL_TABLET | Freq: Two times a day (BID) | ORAL | 3 refills | Status: DC

## 2018-02-24 MED ORDER — ISOSORBIDE MONONITRATE ER 60 MG PO TB24: 60.0000 mg | ORAL_TABLET | Freq: Every day | ORAL | 3 refills | Status: DC

## 2018-02-24 MED ORDER — ISOSORBIDE DINITRATE 30 MG PO TABS: 60.0000 mg | ORAL_TABLET | Freq: Four times a day (QID) | ORAL | 11 refills | Status: DC

## 2018-02-24 NOTE — Patient Instructions (Signed)
Medication Instructions:  Your physician has recommended you make the following change in your medication:  Imdur 60 mg Daily  Coreg 12.5 mg Two Times Daily   If you need a refill on your cardiac medications before your next appointment, please call your pharmacy.   Lab work: NONE  If you have labs (blood work) drawn today and your tests are completely normal, you will receive your results only by: Marland Kitchen MyChart Message (if you have MyChart) OR . A paper copy in the mail If you have any lab test that is abnormal or we need to change your treatment, we will call you to review the results.  Testing/Procedures: Your physician has requested that you have a lexiscan myoview. For further information please visit [email or web address]. Please follow instruction sheet, as given.    Follow-Up: At [facility], you and your health needs are our priority.  As part of our continuing mission to provide you with exceptional heart care, we have created designated Provider Care Teams.  These Care Teams include your primary Cardiologist (physician) and Advanced Practice Providers (APPs -  Physician Assistants and Nurse Practitioners) who all work together to provide you with the care you need, when you need it. You will need a follow up appointment in Next Available.  Please call our office 2 months in advance to schedule this appointment.  You may see Carlyle Dolly, MD or one of the following Advanced Practice Providers on your designated Care Team:   Bernerd Pho, PA-C [facility]) . Ermalinda Barrios, PA-C ([place])  Any Other Special Instructions Will Be Listed Below (If Applicable).  Your physician recommends that you weigh, daily, at the same time every day, and in the same amount of clothing. Please record your daily weights on the handout provided and bring it to your next appointment.  No more that 500 mg of sodium per meal.   1-2 qt of liquid   Thank you for choosing [facility]!

## 2018-02-24 NOTE — Progress Notes (Signed)
Cardiology Office Note   Date:  02/24/2018   ID:  Jonathan Floyd, DOB 08/24/1945, MRN [id number]  PCP:  System, Pcp Not In Cardiologist:  Carlyle Dolly, MD 01/21/2018 Rosaria Ferries, PA-C   No chief complaint on file.   History of Present Illness: Jonathan Floyd is a 72 y.o. 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  Follow-up in 1 month to increase Coreg if possible.  Weight 232 pounds  Jonathan Floyd presents for cardiology follow up.  Last week, he weighed 231, has not weighed this week.   He is not waking w/ LE edema. Denies orthopnea or PND. He uses CPAP nightly.   When he gets fluid, he coughs a lot, has not been doing that.   Is staying with the [facility] for PCP, can go to [place].   Sees Dr Lowanda Foster for renal.   He had chest pain once, about 3 weeks ago. It was a grabbing pain in his upper L chest. It only lasted a few seconds. He did not take nitro or seek help.   His angina was increased DOE and fatigue. He never really had chest pain when he needed a stent.   He is having some episodes of DOE that remind him of his pre-stent sx, but it is not consistent like it was in 2017.  There was confusion over the Coreg dose, he is currently taking 1/2 of 12.5 tab plus 1/2 of the 1/2 tab left over= 9.375 mg bid. He had not been taking the hydralazine tid, started doing that recently. Has only been taking the isordil bid.   Kim with the [facility] contacted regarding records.  The records were faxed to the retail fax number [phone] on 8/29. Authorization number [place] [id number] Validity 12/31/2017 through 07/13/2018 According to the office staff, the fax machine here was in need of repair at that  time may not been working properly.   Past Medical History:  Diagnosis Date  . Cancer ([place])    43 years ago- kidney taken out for it  . Chronic systolic CHF (congestive heart failure) ([place])   . CKD (chronic kidney disease), stage III ([place])    1 kidney  . Coronary artery disease 2017   DES pLAD, [place]. 2 other stents in 2014  . Hypertension     Past Surgical History:  Procedure Laterality Date  . CORONARY ANGIOPLASTY WITH STENT PLACEMENT    . KNEE ARTHROPLASTY    . NEPHRECTOMY  1976    Current Outpatient Medications  Medication Sig Dispense Refill  . acetaminophen (TYLENOL) 500 MG tablet Take 1,000 mg by mouth as needed for mild pain or headache.    . allopurinol (ZYLOPRIM) 300 MG tablet Take 300 mg by mouth daily.    Marland Kitchen aspirin EC 81 MG tablet Take 81 mg by mouth daily.    Marland Kitchen atorvastatin (LIPITOR) 10 MG tablet Take 10 mg by mouth daily at 6 PM.    . capsicum (ZOSTRIX) 0.075 % topical cream Apply 1 application topically as needed (foot pain).    . carvedilol (COREG) 12.5 MG tablet Take 1.5 tablets (18.75 mg total) by mouth 2 (two) times daily with a meal. 270 tablet 1  . ergocalciferol (VITAMIN D2) 50000 units  capsule Take 50,000 Units by mouth 2 (two) times a week.    . finasteride (PROSCAR) 5 MG tablet Take 5 mg by mouth daily.    Marland Kitchen guaiFENesin 200 MG tablet Take 400 mg by mouth 3 (three) times daily.    . hydrALAZINE (APRESOLINE) 50 MG tablet Take 25 mg by mouth 3 (three) times daily.    . insulin glargine (LANTUS) 100 UNIT/ML injection Inject 30 Units into the skin at bedtime.    . isosorbide dinitrate (ISORDIL) 10 MG tablet Take 20 mg by mouth 3 (three) times daily.    . nitroGLYCERIN (NITROSTAT) 0.4 MG SL tablet Place 0.4 mg under the tongue every 5 (five) minutes as needed for chest pain.    . potassium & sodium phosphates (PHOS-NAK) 280-160-250 MG PACK Take 1 packet by mouth 2 (two) times daily.    Marland Kitchen torsemide (DEMADEX) 20 MG tablet Take 20 mg by mouth daily.     No  current facility-administered medications for this visit.     Allergies:   Iodine    Social History:  The patient  reports that he quit smoking about 30 years ago. He has never used smokeless tobacco. He reports that he drinks alcohol. He reports that he has current or past drug history.   Family History:  The patient's family history includes Dementia in his mother; Diabetes in his mother; Heart attack in his father; Heart disease in his father; Hypertension in his father.  He indicated that his mother is deceased. He indicated that his father is deceased.    ROS:  Please see the history of present illness. All other systems are reviewed and negative.    PHYSICAL EXAM: VS:  BP 138/78 (BP Location: Left Arm)   Pulse 62   Ht 5\' 9"  (1.753 m)   Wt 237 lb (107.5 kg)   SpO2 96%   BMI 35.00 kg/m  , BMI Body mass index is 35 kg/m. GEN: Well nourished, well developed, male in no acute distress HEENT: normal for age  Neck: Minimal JVD, no carotid bruit, no masses Cardiac: RRR; no murmur, no rubs, or gallops Respiratory:  clear to auscultation bilaterally, normal work of breathing GI: soft, nontender, nondistended, + BS MS: no deformity or atrophy; no edema; distal pulses are 2+ in all 4 extremities  Skin: warm and dry, no rash Neuro:  Strength and sensation are intact Psych: euthymic mood, full affect   EKG:  EKG is not ordered today.   ECHO: 11/30/2017 - Left ventricle: The cavity size was mildly dilated. Wall   thickness was increased in a pattern of moderate LVH. Systolic   function was severely reduced. The estimated ejection fraction   was in the range of 20% to 25%. Diffuse hypokinesis. Doppler   parameters are consistent with abnormal left ventricular   relaxation (grade 1 diastolic dysfunction). Doppler parameters   are consistent with high ventricular filling pressure. - Aortic valve: There was mild regurgitation. - Mitral valve: There was mild regurgitation. - Left  atrium: The atrium was mildly dilated. - Pulmonary arteries: Systolic pressure was mildly increased. PA   peak pressure: 39 mm Hg (S).  Impressions:  - Severe global reduction in LV systolic function; moderate LVH;   mild LVE; mild diastolic dysfunction; mild AI and MR; mild LAE;   trace TR with mild pulmonary hypertension.    Recent Labs: 11/29/2017: ALT 23; B Natriuretic Peptide 526.2; Hemoglobin 14.5; Platelets 157 11/30/2017: TSH 1.995 12/01/2017: BUN 15; Creatinine, Ser 1.90; Magnesium  2.1; Potassium 3.7; Sodium 142  CBC    Component Value Date/Time   WBC 7.0 11/29/2017 0845   RBC 5.09 11/29/2017 0845   HGB 14.5 11/29/2017 0845   HCT 47.6 11/29/2017 0845   PLT 157 11/29/2017 0845   MCV 93.5 11/29/2017 0845   MCH 28.5 11/29/2017 0845   MCHC 30.5 11/29/2017 0845   RDW 12.8 11/29/2017 0845   CMP Latest Ref Rng & Units 12/01/2017 11/30/2017 11/29/2017  Glucose 70 - 99 mg/dL 120(H) 119(H) 139(H)  BUN 8 - 23 mg/dL 15 16 13   Creatinine 0.61 - 1.24 mg/dL 1.90(H) 1.74(H) 1.61(H)  Sodium 135 - 145 mmol/L 142 141 140  Potassium 3.5 - 5.1 mmol/L 3.7 3.4(L) 4.0  Chloride 98 - 111 mmol/L 107 107 109  CO2 22 - 32 mmol/L 27 26 24   Calcium 8.9 - 10.3 mg/dL 9.4 9.0 8.8(L)  Total Protein 6.5 - 8.1 g/dL - - 6.6  Total Bilirubin 0.3 - 1.2 mg/dL - - 0.8  Alkaline Phos 38 - 126 U/L - - 78  AST 15 - 41 U/L - - 20  ALT 0 - 44 U/L - - 23     Lipid Panel No results found for: CHOL, TRIG, HDL, CHOLHDL, VLDL, LDLCALC, LDLDIRECT   Wt Readings from Last 3 Encounters:  02/24/18 237 lb (107.5 kg)  01/21/18 232 lb 6.4 oz (105.4 kg)  12/01/17 229 lb 14.4 oz (104.3 kg)     Other studies Reviewed: Additional studies/ records that were reviewed today include: Office notes, hospital records and testing.  ASSESSMENT AND PLAN:  1.  Dyspnea on exertion, anginal equivalent: It is been 2 years since he had any ischemic evaluation. -I feel the best option at this time is to do a [employer]. -If this is high risk, discuss the risks and benefits of cardiac catheterization with Dr. Harl Bowie and his nephrologist -Because of his renal function, cardiac catheterization is reserved only if the Myoview is high risk.  The patient and his wife understand that if the Myoview is low or medium risk, medical therapy is the best option - Continue aspirin, statin, beta-blocker and nitrates. -Increase the nitrates and the beta-blocker  2.  Hypertension: There was some confusion leading to missed dosing of his medications. -Change the carvedilol to 12.5 mg twice daily as he was taking 9.375 mg twice daily and I think his blood pressure will tolerate a slightly higher dose. - He was only taking the Isordil twice a day, totaling 40 mg -  Change this to Imdur and increase the dose to 60 mg daily -Follow his blood pressure after these med changes and decide if anything further is needed  3.  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 - Continue beta-blocker, hydralazine and nitrates. -Because of his poor renal function, no ACE/ARB/Entresto  4.  Chronic systolic CHF: - His weight is stable and he has no significant volume overload by exam. -Compliance with a low-sodium diet is encouraged -Volume management per Nephrology   Current medicines are reviewed at length with the patient today.  The patient has concerns regarding medicines.  Concerns were addressed  The following changes have been made: Change Isordil to Imdur 60 mg daily, change Coreg dose to 12.5 mg twice daily  Labs/ tests ordered today include:   Orders Placed This Encounter  Procedures  . NM Myocar  Multi W/Spect W/Wall Motion / EF     Disposition:   FU with Carlyle Dolly, MD  Signed, Rosaria Ferries, PA-C  02/24/2018 5:29 PM    [facility] Phone: [phone];  Fax: [phone]  This note was written with the assistance of speech recognition software.  Please excuse any transcriptional errors.

## 2018-03-02 ENCOUNTER — Encounter (HOSPITAL_BASED_OUTPATIENT_CLINIC_OR_DEPARTMENT_OTHER)
Admission: RE | Admit: 2018-03-02 | Discharge: 2018-03-02 | Disposition: A | Discharge status: Home / Self Care | Payer: No Typology Code available for payment source | Source: Ambulatory Visit | Attending: Physician Assistant | Admitting: Physician Assistant

## 2018-03-02 ENCOUNTER — Encounter (HOSPITAL_COMMUNITY)
Admission: RE | Admit: 2018-03-02 | Discharge: 2018-03-02 | Disposition: A | Discharge status: Home / Self Care | Payer: No Typology Code available for payment source | Source: Ambulatory Visit | Attending: Physician Assistant | Admitting: Physician Assistant

## 2018-03-02 ENCOUNTER — Encounter (HOSPITAL_COMMUNITY): Payer: Self-pay

## 2018-03-02 DIAGNOSIS — R0609 Other forms of dyspnea: Secondary | ICD-10-CM

## 2018-03-02 HISTORY — DX: Disorder of kidney and ureter, unspecified: N28.9

## 2018-03-02 HISTORY — DX: Type 2 diabetes mellitus without complications: E11.9

## 2018-03-02 LAB — NM MYOCAR MULTI W/SPECT W/WALL MOTION / EF
CHL CUP NUCLEAR SDS: 3
CHL CUP NUCLEAR SRS: 6
CHL CUP RESTING HR STRESS: 67 {beats}/min
CSEPPHR: 89 {beats}/min
LV dias vol: 239 mL (ref 62–150)
LV sys vol: 164 mL
RATE: 0.34
SSS: 9
TID: 1.17

## 2018-03-02 MED ORDER — TECHNETIUM TC 99M TETROFOSMIN IV KIT
10.0000 | PACK | Freq: Once | INTRAVENOUS | Status: AC | PRN
  Administered 2018-03-02: 11 via INTRAVENOUS

## 2018-03-02 MED ORDER — REGADENOSON 0.4 MG/5ML IV SOLN
INTRAVENOUS | Status: AC
  Administered 2018-03-02: 0.4 mg via INTRAVENOUS
  Filled 2018-03-02: qty 5

## 2018-03-02 MED ORDER — SODIUM CHLORIDE 0.9% FLUSH
INTRAVENOUS | Status: AC
  Administered 2018-03-02: 10 mL via INTRAVENOUS
  Filled 2018-03-02: qty 10

## 2018-03-02 MED ORDER — TECHNETIUM TC 99M TETROFOSMIN IV KIT
30.0000 | PACK | Freq: Once | INTRAVENOUS | Status: AC | PRN
  Administered 2018-03-02: 31 via INTRAVENOUS

## 2018-03-09 ENCOUNTER — Ambulatory Visit (HOSPITAL_COMMUNITY)
Admission: RE | Admit: 2018-03-09 | Discharge: 2018-03-09 | Disposition: A | Discharge status: Home / Self Care | Payer: No Typology Code available for payment source | Source: Ambulatory Visit | Attending: Nephrology | Admitting: Nephrology

## 2018-03-09 DIAGNOSIS — N183 Chronic kidney disease, stage 3 unspecified: Secondary | ICD-10-CM

## 2018-03-09 DIAGNOSIS — Z905 Acquired absence of kidney: Secondary | ICD-10-CM | POA: Diagnosis not present

## 2018-03-09 DIAGNOSIS — N1330 Unspecified hydronephrosis: Secondary | ICD-10-CM | POA: Insufficient documentation

## 2018-03-09 DIAGNOSIS — Z1159 Encounter for screening for other viral diseases: Secondary | ICD-10-CM | POA: Diagnosis not present

## 2018-03-09 DIAGNOSIS — N2889 Other specified disorders of kidney and ureter: Secondary | ICD-10-CM | POA: Diagnosis not present

## 2018-03-09 DIAGNOSIS — I10 Essential (primary) hypertension: Secondary | ICD-10-CM | POA: Diagnosis not present

## 2018-03-09 DIAGNOSIS — R809 Proteinuria, unspecified: Secondary | ICD-10-CM | POA: Diagnosis not present

## 2018-03-09 DIAGNOSIS — Z79899 Other long term (current) drug therapy: Secondary | ICD-10-CM | POA: Diagnosis not present

## 2018-03-09 DIAGNOSIS — E559 Vitamin D deficiency, unspecified: Secondary | ICD-10-CM | POA: Diagnosis not present

## 2018-03-09 DIAGNOSIS — D509 Iron deficiency anemia, unspecified: Secondary | ICD-10-CM | POA: Diagnosis not present

## 2018-04-29 ENCOUNTER — Ambulatory Visit: Payer: Medicare Other | Admitting: Cardiology

## 2018-05-13 ENCOUNTER — Ambulatory Visit (INDEPENDENT_AMBULATORY_CARE_PROVIDER_SITE_OTHER): Payer: Medicare Other | Admitting: Cardiology

## 2018-05-13 ENCOUNTER — Telehealth: Payer: Self-pay | Admitting: Cardiology

## 2018-05-13 ENCOUNTER — Encounter: Payer: Self-pay | Admitting: Cardiology

## 2018-05-13 VITALS — BP 130/70 | HR 78 | Ht 69.0 in | Wt 232.0 lb

## 2018-05-13 DIAGNOSIS — I10 Essential (primary) hypertension: Secondary | ICD-10-CM | POA: Diagnosis not present

## 2018-05-13 DIAGNOSIS — I2510 Atherosclerotic heart disease of native coronary artery without angina pectoris: Secondary | ICD-10-CM

## 2018-05-13 DIAGNOSIS — I5022 Chronic systolic (congestive) heart failure: Secondary | ICD-10-CM

## 2018-05-13 MED ORDER — CARVEDILOL 12.5 MG PO TABS: 18.7500 mg | ORAL_TABLET | Freq: Two times a day (BID) | ORAL | 3 refills | Status: DC

## 2018-05-13 NOTE — Telephone Encounter (Signed)
°  Precert needed for: Echo - chronic systolic heart failure   Location: [facility]    Date: May 18, 2018

## 2018-05-13 NOTE — Patient Instructions (Signed)
Medication Instructions:   Increase Coreg to 18.75mg  twice a day.  Continue all other medications.    Labwork: none  Testing/Procedures:  Your physician has requested that you have an echocardiogram. Echocardiography is a painless test that uses sound waves to create images of your heart. It provides your doctor with information about the size and shape of your heart and how well your heart's chambers and valves are working. This procedure takes approximately one hour. There are no restrictions for this procedure.  Office will contact with results via phone or letter.    Follow-Up: 2 months   Any Other Special Instructions Will Be Listed Below (If Applicable).  If you need a refill on your cardiac medications before your next appointment, please call your pharmacy.

## 2018-05-13 NOTE — Progress Notes (Signed)
Clinical Summary Jonathan Floyd is a 72 y.o.male previously followed at [facility] cardiology.   1. Chronic systolic HF - echo 10/2945 LVEF 65-46%, grade I diastolic dysfunction - admit 11/2017 with fluid overload with medication and dietaery noncompliance.  - approx 6-7 year history. They think LVEF 30-35% a few years ago.   - chronic SOB. No recent edema. - compliant with meds. Limiting sodium intake. Avoiding NSAIDs - he reports one previous conversation about ICD several years ago.    - at last visit 02/2018 noted patient was not taking meds as prescribed. At that visit coreg adjusted to 12.5mg  bid - no recent SOB/DOE/LE edema    2. History of CAD - history of prior cardiac stents done through [place]. Last in 2017.  - he has a stent card: Nov 2017 xience to prox LAD - EKG with LBBB  -02/2018 nuclear stress large scar as reported below, no significant ischemia.  - occasional chest pain, infrequent.   3. HTN - he remains compliant with meds  4. Hyperlipidemia - compliant with statin  5. CKD III - last Cr in our system is 1.9.  - history of nephrectomy.  - recently referred to Dr Hinda Lenis nephrology   Past Medical History:  Diagnosis Date  . Cancer ([place])    43 years ago- kidney taken out for it  . Chronic systolic CHF (congestive heart failure) ([place])   . CKD (chronic kidney disease), stage III ([place])    1 kidney  . Coronary artery disease 2017   DES pLAD, [place]. 2 other stents in 2014  . Diabetes mellitus without complication ([place])   . Hypertension   . Renal insufficiency      Allergies  Allergen Reactions  . Iodine     Only has one kidney     Current Outpatient Medications  Medication Sig Dispense Refill  . acetaminophen (TYLENOL) 500 MG tablet Take 1,000 mg by mouth as needed for mild pain or headache.    . allopurinol (ZYLOPRIM) 300 MG tablet Take 300 mg by mouth daily.    Marland Kitchen aspirin EC 81 MG tablet Take 81 mg by mouth daily.    Marland Kitchen atorvastatin  (LIPITOR) 10 MG tablet Take 10 mg by mouth daily at 6 PM.    . capsicum (ZOSTRIX) 0.075 % topical cream Apply 1 application topically as needed (foot pain).    . carvedilol (COREG) 12.5 MG tablet Take 1 tablet (12.5 mg total) by mouth 2 (two) times daily. 180 tablet 3  . ergocalciferol (VITAMIN D2) 50000 units capsule Take 50,000 Units by mouth 2 (two) times a week.    . finasteride (PROSCAR) 5 MG tablet Take 5 mg by mouth daily.    Marland Kitchen guaiFENesin 200 MG tablet Take 400 mg by mouth 3 (three) times daily.    . hydrALAZINE (APRESOLINE) 50 MG tablet Take 25 mg by mouth 3 (three) times daily.    . insulin glargine (LANTUS) 100 UNIT/ML injection Inject 30 Units into the skin at bedtime.    . isosorbide dinitrate (ISORDIL) 30 MG tablet Take 2 tablets (60 mg total) by mouth 4 (four) times daily. 60 tablet 11  . nitroGLYCERIN (NITROSTAT) 0.4 MG SL tablet Place 0.4 mg under the tongue every 5 (five) minutes as needed for chest pain.    . potassium & sodium phosphates (PHOS-NAK) 280-160-250 MG PACK Take 1 packet by mouth 2 (two) times daily.    Marland Kitchen torsemide (DEMADEX) 20 MG tablet Take 20 mg by  mouth daily.     No current facility-administered medications for this visit.      Past Surgical History:  Procedure Laterality Date  . CORONARY ANGIOPLASTY WITH STENT PLACEMENT    . KNEE ARTHROPLASTY    . NEPHRECTOMY  1976     Allergies  Allergen Reactions  . Iodine     Only has one kidney      Family History  Problem Relation Age of Onset  . Dementia Mother   . Diabetes Mother   . Heart attack Father   . Heart disease Father   . Hypertension Father      Social History Jonathan Floyd reports that he quit smoking about 30 years ago. He has never used smokeless tobacco. Jonathan Floyd reports current alcohol use.   Review of Systems CONSTITUTIONAL: No weight loss, fever, chills, weakness or fatigue.  HEENT: Eyes: No visual loss, blurred vision, double vision or yellow sclerae.No hearing loss,  sneezing, congestion, runny nose or sore throat.  SKIN: No rash or itching.  CARDIOVASCULAR: per hpi RESPIRATORY: No shortness of breath, cough or sputum.  GASTROINTESTINAL: No anorexia, nausea, vomiting or diarrhea. No abdominal pain or blood.  GENITOURINARY: No burning on urination, no polyuria NEUROLOGICAL: No headache, dizziness, syncope, paralysis, ataxia, numbness or tingling in the extremities. No change in bowel or bladder control.  MUSCULOSKELETAL: No muscle, back pain, joint pain or stiffness.  LYMPHATICS: No enlarged nodes. No history of splenectomy.  PSYCHIATRIC: No history of depression or anxiety.  ENDOCRINOLOGIC: No reports of sweating, cold or heat intolerance. No polyuria or polydipsia.  Marland Kitchen   Physical Examination Vitals:   05/13/18 1123  BP: 130/70  Pulse: 78  SpO2: 94%   Vitals:   05/13/18 1123  Weight: 232 lb (105.2 kg)  Height: 5\' 9"  (1.753 m)    Gen: resting comfortably, no acute distress HEENT: no scleral icterus, pupils equal round and reactive, no palptable cervical adenopathy,  CV: RRR, no m/r/g, no jvd Resp: Clear to auscultation bilaterally GI: abdomen is soft, non-tender, non-distended, normal bowel sounds, no hepatosplenomegaly MSK: extremities are warm, no edema.  Skin: warm, no rash Neuro:  no focal deficits Psych: appropriate affect   Diagnostic Studies 02/2018 nuclear stress  Defect 1: There is a large defect of moderate severity present in the basal inferoseptal, basal inferior, mid anteroseptal, mid inferoseptal, mid inferior, apical septal and apical inferior location.  Findings consistent with prior myocardial infarction. No significant ischemic zones.  This is a high risk study based on degree of scar and significantly depressed LVEF.  Nuclear stress EF: 31%.    11/2017 echo Study Conclusions  - Left ventricle: The cavity size was mildly dilated. Wall   thickness was increased in a pattern of moderate LVH. Systolic    function was severely reduced. The estimated ejection fraction   was in the range of 20% to 25%. Diffuse hypokinesis. Doppler   parameters are consistent with abnormal left ventricular   relaxation (grade 1 diastolic dysfunction). Doppler parameters   are consistent with high ventricular filling pressure. - Aortic valve: There was mild regurgitation. - Mitral valve: There was mild regurgitation. - Left atrium: The atrium was mildly dilated. - Pulmonary arteries: Systolic pressure was mildly increased. PA   peak pressure: 39 mm Hg (S).  Impressions:  - Severe global reduction in LV systolic function; moderate LVH;   mild LVE; mild diastolic dysfunction; mild AI and MR; mild LAE;   trace TR with mild pulmonary hypertension.   Assessment and  Plan  1. Chronic systolic HF - medical therapy limited by renal dysfunction, not on ACEI/ARB/aldactone/arni - increase coreg to 18.75mg  bid - repeat echo, depending on LVEF would consider referal to EP cof ICD considration. With his LBBB would be a candidate for CRT.   2. CAD - infrequent chest pains, recent stress with just scar and no current ischemia - continue current meds  3. HTN - at goal, continue to monitor.      F/u 2 months      Arnoldo Lenis, M.D.,

## 2018-05-18 ENCOUNTER — Ambulatory Visit (INDEPENDENT_AMBULATORY_CARE_PROVIDER_SITE_OTHER): Payer: Non-veteran care

## 2018-05-18 ENCOUNTER — Other Ambulatory Visit: Payer: Self-pay

## 2018-05-18 DIAGNOSIS — I5022 Chronic systolic (congestive) heart failure: Secondary | ICD-10-CM

## 2018-05-21 ENCOUNTER — Telehealth: Payer: Self-pay | Admitting: *Deleted

## 2018-05-21 DIAGNOSIS — R9430 Abnormal result of cardiovascular function study, unspecified: Secondary | ICD-10-CM

## 2018-05-21 DIAGNOSIS — R931 Abnormal findings on diagnostic imaging of heart and coronary circulation: Secondary | ICD-10-CM

## 2018-05-21 NOTE — Telephone Encounter (Signed)
Notes recorded by Laurine Blazer, LPN on 10/20/9430 at 00:37 AM EST Patient & wife notified. Copy to pmd. Follow up scheduled February 2020 with Dr. Harl Bowie. He agrees to EP referral. ------  Notes recorded by Laurine Blazer, LPN on 01/21/4460 at 9:01 PM EST Left message to return call.  ------  Notes recorded by Herminio Commons, MD on 05/18/2018 at 5:31 PM EST LVEF remains severely reduced. As per Dr. Nelly Laurence office note, please make referral to EP for CRT-D.

## 2018-05-31 ENCOUNTER — Encounter: Payer: Self-pay | Admitting: Cardiology

## 2018-05-31 ENCOUNTER — Ambulatory Visit (INDEPENDENT_AMBULATORY_CARE_PROVIDER_SITE_OTHER): Payer: No Typology Code available for payment source | Admitting: Cardiology

## 2018-05-31 VITALS — BP 146/82 | HR 68 | Ht 69.0 in | Wt 229.0 lb

## 2018-05-31 DIAGNOSIS — Z01812 Encounter for preprocedural laboratory examination: Secondary | ICD-10-CM | POA: Diagnosis not present

## 2018-05-31 DIAGNOSIS — I10 Essential (primary) hypertension: Secondary | ICD-10-CM | POA: Diagnosis not present

## 2018-05-31 DIAGNOSIS — I5022 Chronic systolic (congestive) heart failure: Secondary | ICD-10-CM | POA: Diagnosis not present

## 2018-05-31 DIAGNOSIS — I2510 Atherosclerotic heart disease of native coronary artery without angina pectoris: Secondary | ICD-10-CM

## 2018-05-31 LAB — CBC
HEMATOCRIT: 44.3 % (ref 37.5–51.0)
Hemoglobin: 14.5 g/dL (ref 13.0–17.7)
MCH: 28.9 pg (ref 26.6–33.0)
MCHC: 32.7 g/dL (ref 31.5–35.7)
MCV: 88 fL (ref 79–97)
Platelets: 185 10*3/uL (ref 150–450)
RBC: 5.02 x10E6/uL (ref 4.14–5.80)
RDW: 12.2 % (ref 11.6–15.4)
WBC: 6.9 10*3/uL (ref 3.4–10.8)

## 2018-05-31 LAB — BASIC METABOLIC PANEL
BUN/Creatinine Ratio: 10 (ref 10–24)
BUN: 15 mg/dL (ref 8–27)
CO2: 23 mmol/L (ref 20–29)
Calcium: 9.7 mg/dL (ref 8.6–10.2)
Chloride: 104 mmol/L (ref 96–106)
Creatinine, Ser: 1.54 mg/dL — ABNORMAL HIGH (ref 0.76–1.27)
GFR calc non Af Amer: 45 mL/min/{1.73_m2} — ABNORMAL LOW (ref >59)
GFR, EST AFRICAN AMERICAN: 52 mL/min/{1.73_m2} — AB (ref >59)
Glucose: 128 mg/dL — ABNORMAL HIGH (ref 65–99)
Potassium: 4.4 mmol/L (ref 3.5–5.2)
Sodium: 142 mmol/L (ref 134–144)

## 2018-05-31 MED ORDER — CARVEDILOL 25 MG PO TABS: 25.0000 mg | ORAL_TABLET | Freq: Two times a day (BID) | ORAL | 1 refills | Status: DC

## 2018-05-31 NOTE — Patient Instructions (Signed)
Medication Instructions:  Your physician has recommended you make the following change in your medication: 1. INCREASE Carvedilol to 25 mg twice a day     * If you need a refill on your cardiac medications before your next appointment, please call your pharmacy. *   Labwork: Pre procedure lab work today: BMET & CBC * Will notify you of abnormal results, otherwise continue current treatment plan.*   Testing/Procedures: Your physician has recommended that you have a defibrillator inserted. An implantable cardioverter defibrillator (ICD) is a small device that is placed in your chest or, in rare cases, your abdomen. This device uses electrical pulses or shocks to help control life-threatening, irregular heartbeats that could lead the heart to suddenly stop beating (sudden cardiac arrest). Leads are attached to the ICD that goes into your heart. This is done in the hospital and usually requires an overnight stay. Please follow the instructions below, located under the special instructions section.   Follow-Up: Your physician recommends that you schedule a wound check appointment 10-14 days, after your procedure on 06/16/2018, with the device clinic.  Your physician recommends that you schedule a follow up appointment in 91 days, after your procedure on 06/16/2018, with Dr. Curt Bears.  * Please note that any paperwork needing to be filled out by the provider will need to be addressed at the front desk prior to seeing the provider.  Please note that any FMLA, disability or other documents regarding health condition is subject to a $25.00 charge that must be received prior to completion of paperwork in the form of a money order or check. *  Thank you for choosing [facility]!!   Trinidad Curet, RN [phone]   Any Other Special Instructions Will Be Listed Below (If Applicable).     Implantable Device Instructions  You are scheduled for:                  _____ Implantable Cardioverter  Defibrillator  on  06/16/2018  with Dr. Curt Bears.  1.   Please arrive at the [facility], Entrance "A"  at [facility] at  9:30 a.m. on the day of your procedure. (The address is [address])  2. Do not eat or drink after midnight the night before your procedure.  3.   Complete pre procedure  lab work on 05/31/18.  The lab at [facility] is open from 8:00 AM to 4:30 PM.  You do not have to be fasting.  4.   Hold all of your morning medications the morning of your procedure.  5.  Plan for an overnight stay.  Bring your insurance cards and a list of you medications.  6.  Wash your chest and neck with surgical scrub the evening before and the morning of  your procedure.  Rinse well. Please review the surgical scrub instruction sheet given to you.  7. Your chest will need to be shaved prior to this procedure (if needed). We ask that you do this yourself at home 1 to 2 days before or if uncomfortable/unable to do yourself, then it will be performed by the hospital staff the day of.                                                                                                                *  If you have ANY questions after you get home, please call Trinidad Curet, RN @ [phone].  * Every attempt is made to prevent procedures from being rescheduled.  Due to the nature of  Electrophysiology, rescheduling can happen.  The physician is always aware and directs the staff when this occurs.     Cardioverter Defibrillator Implantation An implantable cardioverter defibrillator (ICD) is a small, lightweight, battery-powered device that is placed (implanted) under the skin in the chest or abdomen. Your caregiver may prescribe an ICD if:  You have had an irregular heart rhythm (arrhythmia) that originated in the lower chambers of the heart (ventricles).  Your heart has been damaged by a disease (such as coronary artery disease) or heart condition (such as a heart  attack). An ICD consists of a battery that lasts several years, a small computer called a pulse generator, and wires called leads that go into the heart. It is used to detect and correct two dangerous arrhythmias: a rapid heart rhythm (tachycardia) and an arrhythmia in which the ventricles contract in an uncoordinated way (fibrillation). When an ICD detects tachycardia, it sends an electrical signal to the heart that restores the heartbeat to normal (cardioversion). This signal is usually painless. If cardioversion does not work or if the ICD detects fibrillation, it delivers a small electrical shock to the heart (defibrillation) to restart the heart. The shock may feel like a strong jolt in the chest. ICDs may be programmed to correct other problems. Sometimes, ICDs are programmed to act as another type of implantable device called a pacemaker. Pacemakers are used to treat a slow heartbeat (bradycardia). LET YOUR CAREGIVER KNOW ABOUT:  Any allergies you have.  All medicines you are taking, including vitamins, herbs, eyedrops, and over-the-counter medicines and creams.  Previous problems you or members of your family have had with the use of anesthetics.  Any blood disorders you have had.  Other health problems you have. RISKS AND COMPLICATIONS Generally, the procedure to implant an ICD is safe. However, as with any surgical procedure, complications can occur. Possible complications associated with implanting an ICD include:  Swelling, bleeding, or bruising at the site where the ICD was implanted.  Infection at the site where the ICD was implanted.  A reaction to medicine used during the procedure.  Nerve, heart, or blood vessel damage.  Blood clots. BEFORE THE PROCEDURE  You may need to have blood tests, heart tests, or a chest X-ray done before the day of the procedure.  Ask your caregiver about changing or stopping your regular medicines.  Make plans to have someone drive you home.  You may need to stay in the hospital overnight after the procedure.  Stop smoking at least 24 hours before the procedure.  Take a bath or shower the night before the procedure. You may need to scrub your chest or abdomen with a special type of soap.  Do not eat or drink before your procedure for as long as directed by your caregiver. Ask if it is okay to take any needed medicine with a small sip of water. PROCEDURE  The procedure to implant an ICD in your chest or abdomen is usually done at a hospital in a room that has a large X-ray machine called a fluoroscope. The machine will be above you during the procedure. It will help your caregiver see your heart during the procedure. Implanting an ICD usually takes 1-3 hours. Before the procedure:   Small monitors will be put on your  body. They will be used to check your heart, blood pressure, and oxygen level.  A needle will be put into a vein in your hand or arm. This is called an intravenous (IV) access tube. Fluids and medicine will flow directly into your body through the IV tube.  Your chest or abdomen will be cleaned with a germ-killing (antiseptic) solution. The area may be shaved.  You may be given medicine to help you relax (sedative).  You will be given a medicine called a local anesthetic. This medicine will make the surgical site numb while the ICD is implanted. You will be sleepy but awake during the procedure. After you are numb the procedure will begin. The caregiver will:  Make a small cut (incision). This will make a pocket deep under your skin that will hold the pulse generator.  Guide the leads through a large blood vessel into your heart and attach them to the heart muscles. Depending on the ICD, the leads may go into one ventricle or they may go to both ventricles and into an upper chamber of the heart (atrium).  Test the ICD.  Close the incision with stitches, glue, or staples. AFTER THE PROCEDURE  You may feel pain.  Some pain is normal. It may last a few days.  You may stay in a recovery area until the local anesthetic has worn off. Your blood pressure and pulse will be checked often. You will be taken to a room where your heart will be monitored.  A chest X-ray will be taken. This is done to check that the cardioverter defibrillator is in the right place.  You may stay in the hospital overnight.  A slight bump may be seen over the skin where the ICD was placed. Sometimes, it is possible to feel the ICD under the skin. This is normal.  In the months and years afterward, your caregiver will check the device, the leads, and the battery every few months. Eventually, when the battery is low, the ICD will be replaced.   This information is not intended to replace advice given to you by your health care provider. Make sure you discuss any questions you have with your health care provider.   Document Released: 01/25/2002 Document Revised: 02/23/2013 Document Reviewed: 05/24/2012 Elsevier Interactive Patient Education 2016 [place] Defibrillator Implantation, Care After This sheet gives you information about how to care for yourself after your procedure. Your health care provider may also give you more specific instructions. If you have problems or questions, contact your health care provider. What can I expect after the procedure? After the procedure, it is common to have:  Some pain. It may last a few days.  A slight bump over the skin where the device was placed. Sometimes, it is possible to feel the device under the skin. This is normal.  During the months and years after your procedure, your health care provider will check the device, the leads, and the battery every few months. Eventually, when the battery is low, the device will be replaced. Follow these instructions at home: Medicines  Take over-the-counter and prescription medicines only as told by your health care  provider.  If you were prescribed an antibiotic medicine, take it as told by your health care provider. Do not stop taking the antibiotic even if you start to feel better. Incision care   Follow instructions from your health care provider about how to take care of your incision area. Make sure  you: ? Wash your hands with soap and water before you change your bandage (dressing). If soap and water are not available, use hand sanitizer. ? Change your dressing as told by your health care provider. ? Leave stitches (sutures), skin glue, or adhesive strips in place. These skin closures may need to stay in place for 2 weeks or longer. If adhesive strip edges start to loosen and curl up, you may trim the loose edges. Do not remove adhesive strips completely unless your health care provider tells you to do that.  Check your incision area every day for signs of infection. Check for: ? More redness, swelling, or pain. ? More fluid or blood. ? Warmth. ? Pus or a bad smell.  Do not use lotions or ointments near the incision area unless told by your health care provider.  Keep the incision area clean and dry for 2-3 days after the procedure or for as long as told by your health care provider. It takes several weeks for the incision site to heal completely.  Do not take baths, swim, or use a hot tub until your health care provider approves. Activity  Try to walk a little every day. Exercising is important after this procedure. Also, use your shoulder on the side of the defibrillator in daily tasks that do not require a lot of motion.  For at least 6 weeks: ? Do not lift your upper arm above your shoulders. This means no tennis, golf, or swimming for this period of time. If you tend to sleep with your arm above your head, use a restraint to prevent this during sleep. ? Avoid sudden jerking, pulling, or chopping movements that pull your upper arm far away from your body.  Ask your health care provider  when you may go back to work.  Check with your health care provider before you start to drive or play sports. Electric and magnetic fields  Tell all health care providers that you have a defibrillator. This may prevent them from giving you an MRI scan because strong magnets are used for that test.  If you must pass through a metal detector, quickly walk through it. Do not stop under the detector, and do not stand near it.  Avoid places or objects that have a strong electric or magnetic field, including: ? Airport [employer]. At the airport, let officials know that you have a defibrillator. Your defibrillator ID card will let you be checked in a way that is safe for you and will not damage your defibrillator. Also, do not let a security person wave a magnetic wand near your defibrillator. That can make it stop working. ? Power plants. ? Large electrical generators. ? Anti-theft systems or electronic article surveillance (EAS). ? Radiofrequency transmission towers, such as cell phone and radio towers.  Do not use amateur (ham) radio equipment or electric (arc) welding torches. Some devices are safe to use if held at least 12 inches (30 cm) from your defibrillator. These include power tools, lawn mowers, and speakers. If you are unsure if something is safe to use, ask your health care provider.  Do not use MP3 player headphones. They have magnets.  You may safely use electric blankets, heating pads, computers, and microwave ovens.  When using your cell phone, hold it to the ear that is on the opposite side from the defibrillator. Do not leave your cell phone in a pocket over the defibrillator. General instructions  Follow diet instructions from your health care  provider, if this applies.  Always keep your defibrillator ID card with you. The card should list the implant date, device model, and manufacturer. Consider wearing a medical alert bracelet or necklace.  Have your defibrillator  checked every 3-6 months or as often as told by your health care provider. Most defibrillators last for 4-8 years.  Keep all follow-up visits as told by your health care provider. This is important for your health care provider to make sure your chest is healing the way it should. Ask your health care provider when you should come back to have your stitches or staples taken out. Contact a health care provider if:  You feel one shock in your chest.  You gain weight suddenly.  Your legs or feet swell more than they have before.  It feels like your heart is fluttering or skipping beats (heart palpitations).  You have more redness, swelling, or pain around your incision.  You have more fluid or blood coming from your incision.  Your incision feels warm to the touch.  You have pus or a bad smell coming from your incision.  You have a fever. Get help right away if:  You have chest pain.  You feel more than one shock.  You feel more short of breath than you have felt before.  You feel more light-headed than you have felt before.  Your incision starts to open up. This information is not intended to replace advice given to you by your health care provider. Make sure you discuss any questions you have with your health care provider. Document Released: 11/22/2004 Document Revised: 11/23/2015 Document Reviewed: 10/10/2015 Elsevier Interactive Patient Education  2018 [place] Discharge Instructions for  Pacemaker/Defibrillator Patients  ACTIVITY No heavy lifting or vigorous activity with your left/right arm for 6 to 8 weeks.  Do not raise your left/right arm above your head for one week.  Gradually raise your affected arm as drawn below.           __  NO DRIVING for     ; you may begin driving on     .  WOUND CARE - Keep the wound area clean and dry.  Do not get this area wet for one week. No showers for one week; you may shower on     . - The  tape/steri-strips on your wound will fall off; do not pull them off.  No bandage is needed on the site.  DO  NOT apply any creams, oils, or ointments to the wound area. - If you notice any drainage or discharge from the wound, any swelling or bruising at the site, or you develop a fever > 101? F after you are discharged home, call the office at once.  SPECIAL INSTRUCTIONS - You are still able to use cellular telephones; use the ear opposite the side where you have your pacemaker/defibrillator.  Avoid carrying your cellular phone near your device. - When traveling through airports, show security personnel your identification card to avoid being screened in the metal detectors.  Ask the security personnel to use the hand wand. - Avoid arc welding equipment, MRI testing (magnetic resonance imaging), TENS units (transcutaneous nerve stimulators).  Call the office for questions about other devices. - Avoid electrical appliances that are in poor condition or are not properly grounded. - Microwave ovens are safe to be near or to operate.  ADDITIONAL INFORMATION FOR DEFIBRILLATOR PATIENTS SHOULD YOUR DEVICE GO OFF: - If your  device goes off ONCE and you feel fine afterward, notify the device clinic nurses. - If your device goes off ONCE and you do not feel well afterward, call 911. - If your device goes off TWICE, call 911. - If your device goes off [place], call 911.  DO NOT DRIVE YOURSELF OR A FAMILY MEMBER WITH A DEFIBRILLATOR TO THE HOSPITAL-CALL 911.

## 2018-05-31 NOTE — Progress Notes (Signed)
Electrophysiology Office Note   Date:  05/31/2018   ID:  Jonathan Floyd, DOB 07-23-1945, MRN [id number]  PCP:  [facility]  Cardiologist:  Harl Bowie Primary Electrophysiologist:  Jonathan Sudbury Meredith Leeds, MD    No chief complaint on file.    History of Present Illness: Jonathan Floyd is a 73 y.o. male who is being seen today for the evaluation of CHF at the request of Jonathan Floyd, Jonathan Guild, MD. Presenting today for electrophysiology evaluation.  He has a history of chronic systolic heart failure, coronary artery disease, hypertension, hyperlipidemia, CKD stage III.  He had a recent echo that showed an ejection fraction of 20 to 25%.  He is currently on optimal medical therapy.  Main symptoms are of weakness, fatigue, and shortness of breath.  He got off the elevator coming into clinic today and says that he had to wait to catch his breath.  Does not have PND or orthopnea.    Today, he denies symptoms of palpitations, chest pain,  orthopnea, PND, lower extremity edema, claudication, dizziness, presyncope, syncope, bleeding, or neurologic sequela. The patient is tolerating medications without difficulties.    Past Medical History:  Diagnosis Date  . Cancer ([place])    43 years ago- kidney taken out for it  . Chronic systolic CHF (congestive heart failure) ([place])   . CKD (chronic kidney disease), stage III ([place])    1 kidney  . Coronary artery disease 2017   DES pLAD, [place]. 2 other stents in 2014  . Diabetes mellitus without complication ([place])   . Hypertension   . Renal insufficiency    Past Surgical History:  Procedure Laterality Date  . CORONARY ANGIOPLASTY WITH STENT PLACEMENT    . KNEE ARTHROPLASTY    . NEPHRECTOMY  1976     Current Outpatient Medications  Medication Sig Dispense Refill  . acetaminophen (TYLENOL) 500 MG tablet Take 1,000 mg by mouth as needed for mild pain or headache.    Marland Kitchen aspirin EC 81 MG tablet Take 81 mg by mouth daily.    Marland Kitchen atorvastatin (LIPITOR) 10  MG tablet Take 10 mg by mouth daily at 6 PM.    . capsicum (ZOSTRIX) 0.075 % topical cream Apply 1 application topically as needed (foot pain).    Marland Kitchen ergocalciferol (VITAMIN D2) 50000 units capsule Take 50,000 Units by mouth 2 (two) times a week.    . finasteride (PROSCAR) 5 MG tablet Take 5 mg by mouth daily.    Marland Kitchen guaiFENesin 200 MG tablet Take 400 mg by mouth 3 (three) times daily.    . hydrALAZINE (APRESOLINE) 50 MG tablet Take 25 mg by mouth 3 (three) times daily.    . insulin glargine (LANTUS) 100 UNIT/ML injection Inject 30 Units into the skin at bedtime.    . isosorbide dinitrate (ISORDIL) 10 MG tablet Take 20 mg by mouth 3 (three) times daily.    . nitroGLYCERIN (NITROSTAT) 0.4 MG SL tablet Place 0.4 mg under the tongue every 5 (five) minutes as needed for chest pain.    . potassium & sodium phosphates (PHOS-NAK) 280-160-250 MG PACK Take 1 packet by mouth 2 (two) times daily.    Marland Kitchen torsemide (DEMADEX) 20 MG tablet Take 20 mg by mouth daily.    . carvedilol (COREG) 25 MG tablet Take 1 tablet (25 mg total) by mouth 2 (two) times daily. 180 tablet 1   No current facility-administered medications for this visit.     Allergies:   Iodine   Social  History:  The patient  reports that he quit smoking about 31 years ago. He has never used smokeless tobacco. He reports current alcohol use. He reports previous drug use.   Family History:  The patient's family history includes Dementia in his mother; Diabetes in his mother; Heart attack in his father; Heart disease in his father; Hypertension in his father.    ROS:  Please see the history of present illness.   Otherwise, review of systems is positive for chest pain, shortness of breath, palpitations, visual changes, wheezing.   All other systems are reviewed and negative.    PHYSICAL EXAM: VS:  BP (!) 146/82   Pulse 68   Ht 5\' 9"  (1.753 m)   Wt 229 lb (103.9 kg)   BMI 33.82 kg/m  , BMI Body mass index is 33.82 kg/m. GEN: Well nourished,  well developed, in no acute distress  HEENT: normal  Neck: no JVD, carotid bruits, or masses Cardiac: RRR; no murmurs, rubs, or gallops,no edema  Respiratory:  clear to auscultation bilaterally, normal work of breathing GI: soft, nontender, nondistended, + BS MS: no deformity or atrophy  Skin: warm and dry Neuro:  Strength and sensation are intact Psych: euthymic mood, full affect  EKG:  EKG is ordered today. Personal review of the ekg ordered shows SR, LBBB  Recent Labs: 11/29/2017: ALT 23; B Natriuretic Peptide 526.2; Hemoglobin 14.5; Platelets 157 11/30/2017: TSH 1.995 12/01/2017: BUN 15; Creatinine, Ser 1.90; Magnesium 2.1; Potassium 3.7; Sodium 142    Lipid Panel  No results found for: CHOL, TRIG, HDL, CHOLHDL, VLDL, LDLCALC, LDLDIRECT   Wt Readings from Last 3 Encounters:  05/31/18 229 lb (103.9 kg)  05/13/18 232 lb (105.2 kg)  02/24/18 237 lb (107.5 kg)      Other studies Reviewed: Additional studies/ records that were reviewed today include: TTE 05/18/18  Review of the above records today demonstrates:  - Left ventricle: The cavity size was normal. Wall thickness was   increased in a pattern of mild LVH. Systolic function was   severely reduced. The estimated ejection fraction was in the   range of 20% to 25%. Diffuse hypokinesis. Doppler parameters are   consistent with abnormal left ventricular relaxation (grade 1   diastolic dysfunction). Doppler parameters are consistent with   indeterminate ventricular filling pressure. - Regional wall motion abnormality: Akinesis of the basal-mid   anteroseptal myocardium. - Aortic valve: Trileaflet; mildly thickened, mildly calcified   leaflets. There was mild stenosis (low output, low gradient).   There was mild regurgitation. Valve area (VTI): 1.72 cm^2. Valve   area (Vmax): 1.69 cm^2. Valve area (Vmean): 1.66 cm^2. - Mitral valve: Mildly calcified annulus. Mildly thickened leaflets   . There was moderate  regurgitation. - Left atrium: The atrium was moderately dilated. - Tricuspid valve: There was mild-moderate regurgitation. - Pulmonary arteries: PA peak pressure: 36 mm Hg (S).   ASSESSMENT AND PLAN:  1.  Chronic systolic heart failure: Medical therapy is unfortunately limited by renal dysfunction.  He is not on an ACE inhibitor or ARB.  Currently on Coreg, hydralazine, imdur.  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  Risks and benefits of ICD therapy were discussed and include bleeding, tamponade, infection, pneumothorax.  The patient understands these risks and is agreed to the procedure.  2.  Coronary artery disease: Recent stress test with scar and no ischemia.  No current chest pain.  3.  Hypertension: Pressure is elevated  today.  We Dayshon Roback thus increase his carvedilol to 25 mg.    Current medicines are reviewed at length with the patient today.   The patient does not have concerns regarding his medicines.  The following changes were made today: Increase carvedilol  Labs/ tests ordered today include:  Orders Placed This Encounter  Procedures  . CBC  . Basic metabolic panel  . EKG 12-Lead   Case discussed with referring cardiologist  Disposition:   FU with [facility] 3 months  Signed, Catalea Labrecque Meredith Leeds, MD  05/31/2018 11:07 AM     [facility] [address] [place] [address] [phone] (office) [phone] (fax)

## 2018-05-31 NOTE — Addendum Note (Signed)
Addended by: Eulis Foster on: 05/31/2018 11:11 AM   Modules accepted: Orders

## 2018-06-11 ENCOUNTER — Telehealth: Payer: Self-pay | Admitting: Cardiology

## 2018-06-11 NOTE — Telephone Encounter (Signed)
Wife needs FMLA paperwork completed r/t to being out with her husband next week for his procedure. Advised to drop off at the front desk under [facility] name and I would take care of it next week. She appreciates it.

## 2018-06-11 NOTE — Telephone Encounter (Signed)
New Message:    Wife says pt is having a procedure next week and you told her to call if she had questions. Please call, she says she have some questions.

## 2018-06-16 ENCOUNTER — Ambulatory Visit (HOSPITAL_COMMUNITY): Payer: No Typology Code available for payment source

## 2018-06-16 ENCOUNTER — Ambulatory Visit (HOSPITAL_COMMUNITY)
Admission: RE | Disposition: A | Discharge status: Home / Self Care | Payer: Self-pay | Source: Home / Self Care | Attending: Cardiology

## 2018-06-16 ENCOUNTER — Ambulatory Visit (HOSPITAL_COMMUNITY)
Admission: RE | Admit: 2018-06-16 | Discharge: 2018-06-18 | Disposition: A | Discharge status: Home / Self Care | Payer: No Typology Code available for payment source | Attending: Cardiology | Admitting: Cardiology

## 2018-06-16 ENCOUNTER — Encounter (HOSPITAL_COMMUNITY): Payer: Self-pay | Admitting: General Practice

## 2018-06-16 ENCOUNTER — Other Ambulatory Visit: Payer: Self-pay

## 2018-06-16 DIAGNOSIS — Z7982 Long term (current) use of aspirin: Secondary | ICD-10-CM | POA: Diagnosis not present

## 2018-06-16 DIAGNOSIS — I255 Ischemic cardiomyopathy: Secondary | ICD-10-CM

## 2018-06-16 DIAGNOSIS — Z833 Family history of diabetes mellitus: Secondary | ICD-10-CM | POA: Insufficient documentation

## 2018-06-16 DIAGNOSIS — N183 Chronic kidney disease, stage 3 (moderate): Secondary | ICD-10-CM | POA: Diagnosis not present

## 2018-06-16 DIAGNOSIS — Z905 Acquired absence of kidney: Secondary | ICD-10-CM | POA: Insufficient documentation

## 2018-06-16 DIAGNOSIS — Z794 Long term (current) use of insulin: Secondary | ICD-10-CM | POA: Insufficient documentation

## 2018-06-16 DIAGNOSIS — I2510 Atherosclerotic heart disease of native coronary artery without angina pectoris: Secondary | ICD-10-CM | POA: Diagnosis not present

## 2018-06-16 DIAGNOSIS — Z955 Presence of coronary angioplasty implant and graft: Secondary | ICD-10-CM | POA: Insufficient documentation

## 2018-06-16 DIAGNOSIS — Z8249 Family history of ischemic heart disease and other diseases of the circulatory system: Secondary | ICD-10-CM | POA: Insufficient documentation

## 2018-06-16 DIAGNOSIS — I447 Left bundle-branch block, unspecified: Secondary | ICD-10-CM | POA: Insufficient documentation

## 2018-06-16 DIAGNOSIS — Z006 Encounter for examination for normal comparison and control in clinical research program: Secondary | ICD-10-CM | POA: Insufficient documentation

## 2018-06-16 DIAGNOSIS — Z87891 Personal history of nicotine dependence: Secondary | ICD-10-CM | POA: Insufficient documentation

## 2018-06-16 DIAGNOSIS — Z95810 Presence of automatic (implantable) cardiac defibrillator: Secondary | ICD-10-CM

## 2018-06-16 DIAGNOSIS — I5022 Chronic systolic (congestive) heart failure: Secondary | ICD-10-CM | POA: Diagnosis not present

## 2018-06-16 DIAGNOSIS — I130 Hypertensive heart and chronic kidney disease with heart failure and stage 1 through stage 4 chronic kidney disease, or unspecified chronic kidney disease: Secondary | ICD-10-CM | POA: Insufficient documentation

## 2018-06-16 DIAGNOSIS — Z950 Presence of cardiac pacemaker: Secondary | ICD-10-CM

## 2018-06-16 DIAGNOSIS — Z888 Allergy status to other drugs, medicaments and biological substances status: Secondary | ICD-10-CM | POA: Diagnosis not present

## 2018-06-16 DIAGNOSIS — E1122 Type 2 diabetes mellitus with diabetic chronic kidney disease: Secondary | ICD-10-CM | POA: Insufficient documentation

## 2018-06-16 DIAGNOSIS — Z79899 Other long term (current) drug therapy: Secondary | ICD-10-CM | POA: Diagnosis not present

## 2018-06-16 DIAGNOSIS — Z95818 Presence of other cardiac implants and grafts: Secondary | ICD-10-CM

## 2018-06-16 HISTORY — DX: Presence of automatic (implantable) cardiac defibrillator: Z95.810

## 2018-06-16 HISTORY — PX: BIV ICD INSERTION CRT-D: EP1195

## 2018-06-16 LAB — GLUCOSE, CAPILLARY
Glucose-Capillary: 127 mg/dL — ABNORMAL HIGH (ref 70–99)
Glucose-Capillary: 134 mg/dL — ABNORMAL HIGH (ref 70–99)
Glucose-Capillary: 215 mg/dL — ABNORMAL HIGH (ref 70–99)

## 2018-06-16 LAB — BASIC METABOLIC PANEL
Anion gap: 10 (ref 5–15)
BUN: 15 mg/dL (ref 8–23)
CO2: 24 mmol/L (ref 22–32)
Calcium: 9.4 mg/dL (ref 8.9–10.3)
Chloride: 103 mmol/L (ref 98–111)
Creatinine, Ser: 1.38 mg/dL — ABNORMAL HIGH (ref 0.61–1.24)
GFR calc Af Amer: 60 mL/min — ABNORMAL LOW (ref >60)
GFR calc non Af Amer: 51 mL/min — ABNORMAL LOW (ref >60)
Glucose, Bld: 224 mg/dL — ABNORMAL HIGH (ref 70–99)
Potassium: 3.7 mmol/L (ref 3.5–5.1)
Sodium: 137 mmol/L (ref 135–145)

## 2018-06-16 LAB — MAGNESIUM: Magnesium: 1.9 mg/dL (ref 1.7–2.4)

## 2018-06-16 LAB — SURGICAL PCR SCREEN
MRSA, PCR: NEGATIVE
Staphylococcus aureus: NEGATIVE

## 2018-06-16 SURGERY — BIV ICD INSERTION CRT-D

## 2018-06-16 MED ORDER — SODIUM CHLORIDE 0.9 % IV SOLN
80.0000 mg | INTRAVENOUS | Status: AC
  Administered 2018-06-16: 80 mg
  Filled 2018-06-16: qty 2

## 2018-06-16 MED ORDER — IOPAMIDOL (ISOVUE-370) INJECTION 76%
INTRAVENOUS | Status: AC
  Filled 2018-06-16: qty 50

## 2018-06-16 MED ORDER — ISOSORBIDE DINITRATE 20 MG PO TABS
20.0000 mg | ORAL_TABLET | Freq: Three times a day (TID) | ORAL | Status: DC
  Administered 2018-06-16 – 2018-06-18 (×5): 20 mg via ORAL
  Filled 2018-06-16 (×6): qty 1

## 2018-06-16 MED ORDER — FENTANYL CITRATE (PF) 100 MCG/2ML IJ SOLN
INTRAMUSCULAR | Status: AC
  Filled 2018-06-16: qty 2

## 2018-06-16 MED ORDER — HEPARIN (PORCINE) IN NACL 1000-0.9 UT/500ML-% IV SOLN
INTRAVENOUS | Status: AC
  Filled 2018-06-16: qty 500

## 2018-06-16 MED ORDER — MIDAZOLAM HCL 5 MG/5ML IJ SOLN
INTRAMUSCULAR | Status: DC | PRN
  Administered 2018-06-16 (×6): 1 mg via INTRAVENOUS

## 2018-06-16 MED ORDER — INSULIN GLARGINE 100 UNIT/ML ~~LOC~~ SOLN
30.0000 [IU] | Freq: Every day | SUBCUTANEOUS | Status: DC
  Administered 2018-06-16 – 2018-06-17 (×2): 30 [IU] via SUBCUTANEOUS
  Filled 2018-06-16 (×2): qty 0.3

## 2018-06-16 MED ORDER — FENTANYL CITRATE (PF) 100 MCG/2ML IJ SOLN
INTRAMUSCULAR | Status: DC | PRN
  Administered 2018-06-16 (×5): 25 ug via INTRAVENOUS

## 2018-06-16 MED ORDER — CARVEDILOL 25 MG PO TABS
25.0000 mg | ORAL_TABLET | Freq: Two times a day (BID) | ORAL | Status: DC
  Administered 2018-06-16 – 2018-06-18 (×4): 25 mg via ORAL
  Filled 2018-06-16 (×2): qty 1
  Filled 2018-06-16: qty 2
  Filled 2018-06-16: qty 1
  Filled 2018-06-16 (×2): qty 2
  Filled 2018-06-16: qty 1
  Filled 2018-06-16: qty 2

## 2018-06-16 MED ORDER — ATORVASTATIN CALCIUM 10 MG PO TABS
10.0000 mg | ORAL_TABLET | Freq: Every day | ORAL | Status: DC
  Administered 2018-06-16 – 2018-06-17 (×2): 10 mg via ORAL
  Filled 2018-06-16 (×2): qty 1

## 2018-06-16 MED ORDER — CEFAZOLIN SODIUM-DEXTROSE 2-4 GM/100ML-% IV SOLN
2.0000 g | INTRAVENOUS | Status: AC
  Administered 2018-06-16: 2 g via INTRAVENOUS
  Filled 2018-06-16: qty 100

## 2018-06-16 MED ORDER — MIDAZOLAM HCL 5 MG/5ML IJ SOLN
INTRAMUSCULAR | Status: AC
  Filled 2018-06-16: qty 5

## 2018-06-16 MED ORDER — HYDRALAZINE HCL 20 MG/ML IJ SOLN
INTRAMUSCULAR | Status: AC
  Filled 2018-06-16: qty 1

## 2018-06-16 MED ORDER — CEFAZOLIN SODIUM-DEXTROSE 2-4 GM/100ML-% IV SOLN
INTRAVENOUS | Status: AC
  Filled 2018-06-16: qty 100

## 2018-06-16 MED ORDER — TRAMADOL HCL 50 MG PO TABS
25.0000 mg | ORAL_TABLET | Freq: Once | ORAL | Status: AC
  Administered 2018-06-16: 22:00:00 25 mg via ORAL
  Filled 2018-06-16: qty 1

## 2018-06-16 MED ORDER — LIDOCAINE HCL 1 % IJ SOLN
INTRAMUSCULAR | Status: AC
  Filled 2018-06-16: qty 60

## 2018-06-16 MED ORDER — CHLORHEXIDINE GLUCONATE 4 % EX LIQD
60.0000 mL | Freq: Once | CUTANEOUS | Status: DC
  Filled 2018-06-16: qty 60

## 2018-06-16 MED ORDER — HYDRALAZINE HCL 20 MG/ML IJ SOLN
INTRAMUSCULAR | Status: DC | PRN
  Administered 2018-06-16: 10 mg via INTRAVENOUS

## 2018-06-16 MED ORDER — ACETAMINOPHEN 500 MG PO TABS
1000.0000 mg | ORAL_TABLET | Freq: Four times a day (QID) | ORAL | Status: DC | PRN
  Administered 2018-06-16: 1000 mg via ORAL
  Filled 2018-06-16: qty 2

## 2018-06-16 MED ORDER — HEPARIN (PORCINE) IN NACL 1000-0.9 UT/500ML-% IV SOLN
INTRAVENOUS | Status: DC | PRN
  Administered 2018-06-16: 500 mL

## 2018-06-16 MED ORDER — ASPIRIN EC 81 MG PO TBEC
81.0000 mg | DELAYED_RELEASE_TABLET | Freq: Every day | ORAL | Status: DC
  Administered 2018-06-17 – 2018-06-18 (×2): 81 mg via ORAL
  Filled 2018-06-16 (×2): qty 1

## 2018-06-16 MED ORDER — YOU HAVE A PACEMAKER BOOK
Freq: Once | Status: AC
  Administered 2018-06-16: 22:00:00 1
  Filled 2018-06-16: qty 1

## 2018-06-16 MED ORDER — FINASTERIDE 5 MG PO TABS
5.0000 mg | ORAL_TABLET | Freq: Every day | ORAL | Status: DC
  Administered 2018-06-17 – 2018-06-18 (×2): 5 mg via ORAL
  Filled 2018-06-16 (×2): qty 1

## 2018-06-16 MED ORDER — VITAMIN D (ERGOCALCIFEROL) 1.25 MG (50000 UNIT) PO CAPS
50000.0000 [IU] | ORAL_CAPSULE | ORAL | Status: DC
  Filled 2018-06-16: qty 1

## 2018-06-16 MED ORDER — SODIUM CHLORIDE 0.9 % IV SOLN
INTRAVENOUS | Status: DC
  Administered 2018-06-16: 10:00:00 via INTRAVENOUS

## 2018-06-16 MED ORDER — HYDRALAZINE HCL 25 MG PO TABS
25.0000 mg | ORAL_TABLET | Freq: Three times a day (TID) | ORAL | Status: DC
  Administered 2018-06-16 – 2018-06-18 (×5): 25 mg via ORAL
  Filled 2018-06-16 (×6): qty 1

## 2018-06-16 MED ORDER — SODIUM CHLORIDE 0.9 % IV SOLN
INTRAVENOUS | Status: AC
  Filled 2018-06-16: qty 2

## 2018-06-16 MED ORDER — CEFAZOLIN SODIUM-DEXTROSE 1-4 GM/50ML-% IV SOLN
1.0000 g | Freq: Four times a day (QID) | INTRAVENOUS | Status: AC
  Administered 2018-06-16 – 2018-06-17 (×3): 1 g via INTRAVENOUS
  Filled 2018-06-16 (×3): qty 50

## 2018-06-16 MED ORDER — NITROGLYCERIN 0.4 MG SL SUBL: 0.4000 mg | SUBLINGUAL_TABLET | SUBLINGUAL | Status: DC | PRN

## 2018-06-16 MED ORDER — IOPAMIDOL (ISOVUE-370) INJECTION 76%
INTRAVENOUS | Status: DC | PRN
  Administered 2018-06-16: 50 mL via INTRAVENOUS

## 2018-06-16 MED ORDER — MUPIROCIN 2 % EX OINT
1.0000 "application " | TOPICAL_OINTMENT | Freq: Once | CUTANEOUS | Status: DC
  Filled 2018-06-16 (×2): qty 22

## 2018-06-16 MED ORDER — TORSEMIDE 20 MG PO TABS: 20.0000 mg | ORAL_TABLET | Freq: Every day | ORAL | Status: DC | PRN

## 2018-06-16 MED ORDER — LIDOCAINE HCL (PF) 1 % IJ SOLN
INTRAMUSCULAR | Status: DC | PRN
  Administered 2018-06-16: 40 mL

## 2018-06-16 MED ORDER — INSULIN ASPART 100 UNIT/ML ~~LOC~~ SOLN
0.0000 [IU] | Freq: Three times a day (TID) | SUBCUTANEOUS | Status: DC
  Administered 2018-06-16 – 2018-06-17 (×3): 2 [IU] via SUBCUTANEOUS

## 2018-06-16 MED ORDER — ONDANSETRON HCL 4 MG/2ML IJ SOLN
4.0000 mg | Freq: Four times a day (QID) | INTRAMUSCULAR | Status: DC | PRN
  Administered 2018-06-16 – 2018-06-17 (×3): 4 mg via INTRAVENOUS
  Filled 2018-06-16 (×4): qty 2

## 2018-06-16 MED ORDER — POTASSIUM & SODIUM PHOSPHATES 280-160-250 MG PO PACK
1.0000 | PACK | Freq: Two times a day (BID) | ORAL | Status: DC
  Administered 2018-06-16 – 2018-06-17 (×3): 1 via ORAL
  Filled 2018-06-16 (×4): qty 1

## 2018-06-16 SURGICAL SUPPLY — 24 items
ASSURA CRTD CD3369-40Q (ICD Generator) ×3 IMPLANT
BALLN COR SINUS VENO 6F 80CM (BALLOONS) ×1
BALLN COR SINUS VENO 6FR 80 (BALLOONS) ×2
BALLOON COR SINUS VENO 6FR 80 (BALLOONS) IMPLANT
CABLE SURGICAL S-101-97-12 (CABLE) ×3 IMPLANT
CATH CPS DIRECT 135 DS2C020 (CATHETERS) ×2 IMPLANT
CATH CPS QUART CN DS2N029-65 (CATHETERS) ×2 IMPLANT
CATH CPS QUART SUB DS2N026-59 (CATHETERS) ×2 IMPLANT
CATH CPS QUART SUB DS2N028-59 (CATHETERS) ×2 IMPLANT
CPS IMPLANT KIT 410190 (MISCELLANEOUS) ×2 IMPLANT
DEFIB ASSURA MULTI-CHMBR CRT-D (ICD Generator) IMPLANT
GUIDEWIRE ANGLED .035X150CM (WIRE) ×2 IMPLANT
LEAD DURATA 7122Q-65CM (Lead) ×2 IMPLANT
LEAD QUARTET 1458Q-86CM (Lead) ×2 IMPLANT
LEAD TENDRIL MRI 52CM LPA1200M (Lead) ×2 IMPLANT
PAD PRO RADIOLUCENT 2001M-C (PAD) ×3 IMPLANT
SHEATH CLASSIC 7F (SHEATH) ×2 IMPLANT
SHEATH CLASSIC 8F (SHEATH) ×2 IMPLANT
SHEATH WORLEY 9FR 62CM (SHEATH) ×2 IMPLANT
TRAY PACEMAKER INSERTION (PACKS) ×3 IMPLANT
WIRE ACUITY WHISPER EDS 4648 (WIRE) ×4 IMPLANT
WIRE ASAHI SION 190X3X12 .014 (WIRE) ×2 IMPLANT
WIRE HI TORQ VERSACORE-J 145CM (WIRE) ×2 IMPLANT
WIRE MAILMAN 182CM (WIRE) ×2 IMPLANT

## 2018-06-16 NOTE — Progress Notes (Signed)
Patient and family notified that radiology will be up to take an xray at the bedside to check and make sure everything looks okay. Ordered by Dr. Curt Bears after noting rhythm change. Will still get xray in am. Family and patient verbalize understanding.

## 2018-06-16 NOTE — Discharge Summary (Addendum)
ELECTROPHYSIOLOGY PROCEDURE DISCHARGE SUMMARY    Patient ID: Jonathan Floyd,  MRN: [id number], DOB/AGE: 1945-11-10 73 y.o.  Admit date: 06/16/2018 Discharge date: 06/18/2018  Primary Care Physician: [facility]  Primary Cardiologist: Dr. Harl Bowie Electrophysiologist: Dr. Curt Bears  Primary Discharge Diagnosis:  1. Chronic CHF (systolic) 2. ICM 3. LBBB 4. N/V  6. PAT  Secondary Discharge Diagnosis:  1. CAD 2. HTN 3. HLD 4. CKD (III)    H/o nephrectomy   Allergies  Allergen Reactions  . Iodine Other (See Comments)    Only has one kidney     Procedures This Admission:  1.  Implantation of a SJM CRT-D on 06/16/2018 by Dr Curt Bears  The patient received a St. Jude [id number] (serial # [id number] ) right atrial lead and a St. Jude Durata [id number] (serial number [id number]) right ventricular defibrillator lead, St. Jude Quartet [id number] [id number] (serial number [id number] ) lead (LV), St. Jude Quadra Assura MP [id number] CRT-D (serial Number [id number] ) device DFT's were deferred at time of implant.   There were no immediate post procedure complications. 2.  CXR on 06/17/28 demonstrated no pneumothorax status post device implantation.   Brief HPI: Jonathan Floyd is a 73 y.o. male was referred to electrophysiology in the outpatient setting for consideration of ICD implantation.  Past medical history includes above.  The patient has persistent LV dysfunction despite guideline directed therapy.  Risks, benefits, and alternatives to ICD implantation were reviewed with the patient who wished to proceed.   Hospital Course:  The patient was admitted and underwent implantation of an ICD with details as outlined above. He was monitored on telemetry overnight which demonstrated SR, v PACED.  Left chest with small to mod hematoma, small area of ecchymosis, laterally.  Pressure dressing was applied.  The device was interrogated and found to be functioning normally.  CXR was obtained and demonstrated no  pneumothorax status post device implantation.  Wound care, arm mobility, and restrictions were reviewed with the patient.  The patient had nausea with vomiting POD #1 morning, the vomiting resolved with IV Zofran, though he had persistent significant nausea and discharge held.  BS were OK, BP stable, BMET with stable renal function.  He was observed to have increased episodes of NSVT on telemetry, his ICD recheck POD 1 evening and day of discharge noted stable lead measurements and WCT was AT w/BBB, no ventricular.  Jonathan Floyd continue his home coreg and monitor burden via his device. The pressure dressing was removed POD #2, noted improvement in his hematoma, soft, non-tender site, ecchymosis remains, unchanged in size.  The patient feels much better this morning with resolved nausea.  Has eaten without difficulty, he denies any CP or SOB, reports minimal discomfort at the implant site.  He was examined by Dr. Curt Bears and considered stable for discharge to home.   The patient's discharge medications include an ACE-I (unable with CKD/single kidney) and beta blocker (carevdilol).   Physical Exam: Vitals:   06/17/18 1515 06/17/18 2000 06/18/18 0345 06/18/18 0724  BP: 131/76  (!) 144/87 (!) 175/106  Pulse:    91  Resp: (!) 22 (!) 30 19 20   Temp:   98 F (36.7 C) 98.5 F (36.9 C)  TempSrc:   Oral Oral  SpO2:   95% 96%  Weight:   105.3 kg   Height:        GEN- The patient is well appearing, alert and oriented x 3 today.   HEENT: normocephalic, atraumatic; sclera clear, conjunctiva pink;  hearing intact; oropharynx clear Lungs- CTA b/l, normal work of breathing.  No wheezes, rales, rhonchi Heart-  RRR, no murmurs, rubs or gallops, PMI not laterally displaced GI- soft, non-tender, non-distended Extremities- no clubbing, cyanosis, or edema MS- no significant deformity or atrophy Skin- warm and dry, no rash or lesion, left chest without remaining hematoma, small-mod sized area of ecchymosis to lateral side  of wound with ecchymosis Psych- euthymic mood, full affect Neuro- no gross defecits  Labs:   Lab Results  Component Value Date   WBC 6.9 05/31/2018   HGB 14.5 05/31/2018   HCT 44.3 05/31/2018   MCV 88 05/31/2018   PLT 185 05/31/2018    Recent Labs  Lab 06/17/18 1542  NA 137  K 4.3  CL 105  CO2 24  BUN 13  CREATININE 1.60*  CALCIUM 9.3  GLUCOSE 165*    Discharge Medications:  Allergies as of 06/18/2018      Reactions   Iodine Other (See Comments)   Only has one kidney      Medication List    TAKE these medications   acetaminophen 500 MG tablet Commonly known as:  TYLENOL Take 1,000 mg by mouth every 6 (six) hours as needed for mild pain or headache.   aspirin EC 81 MG tablet Take 81 mg by mouth daily.   atorvastatin 10 MG tablet Commonly known as:  LIPITOR Take 10 mg by mouth at bedtime.   carvedilol 25 MG tablet Commonly known as:  COREG Take 1 tablet (25 mg total) by mouth 2 (two) times daily.   chlorhexidine 4 % external liquid Commonly known as:  HIBICLENS Apply 1 application topically See admin instructions. Apply the Floyd before and the morning of the procedure   ergocalciferol 1.25 MG (50000 UT) capsule Commonly known as:  VITAMIN D2 Take 50,000 Units by mouth 2 (two) times a week. Sunday and Wednesday   finasteride 5 MG tablet Commonly known as:  PROSCAR Take 5 mg by mouth daily.   hydrALAZINE 50 MG tablet Commonly known as:  APRESOLINE Take 25 mg by mouth 3 (three) times daily.   insulin glargine 100 UNIT/ML injection Commonly known as:  LANTUS Inject 30 Units into the skin at bedtime.   isosorbide dinitrate 10 MG tablet Commonly known as:  ISORDIL Take 20 mg by mouth 3 (three) times daily.   nitroGLYCERIN 0.4 MG SL tablet Commonly known as:  NITROSTAT Place 0.4 mg under the tongue every 5 (five) minutes as needed for chest pain.   potassium & sodium phosphates 280-160-250 MG Pack Commonly known as:  PHOS-NAK Take 1 packet by  mouth 2 (two) times daily.   torsemide 20 MG tablet Commonly known as:  DEMADEX Take 20 mg by mouth daily as needed (for fluid).       Disposition:  Home  Discharge Instructions    Diet - low sodium heart healthy   Complete by:  As directed    Increase activity slowly   Complete by:  As directed      Follow-up Information    [facility] Follow up.   Specialty:  Cardiology Why:  06/28/2018 @ 8:40AM, wound check visit Contact information: [address], [address] [place]       Constance Haw, MD Follow up.   Specialty:  Cardiology Why:  09/28/2018 @ 1:45PM Contact information: [address] [place] [address] [phone]  Duration of Discharge Encounter: Greater than 30 minutes including physician time.  Jonathan Night, Jonathan Floyd 06/18/2018 7:55 AM     I have seen and examined this patient with Jonathan Floyd.  Agree with above, note added to reflect my findings.  On exam, RRR, no murmurs, lungs clear.  Status post [facility] Jude CRT-D.  Device functioning appropriately today.  He has had some atrial arrhythmias, but none that require therapy at this time.  Chest x-ray and interrogation is without abnormality.  He did have nausea and vomiting yesterday but this has greatly improved.  We Dione Petron plan for discharge today with follow-up in clinic.  Ryllie Nieland M. Aadhira Heffernan MD 06/18/2018 9:34 AM

## 2018-06-16 NOTE — Progress Notes (Signed)
Patient arrived from cath lab complaining of nausea. Given zofran 4 mg IV at 1610 as soon as available. Patient encouraged to rest and let zofran work to relieve nausea.  1700 Patient resting and denied any nausea but complains about pain in incisional area. Given tylenol 1000 mg po. Patient reported some relief after 45 minutes, at 1830 stated pain relieved. Dr. Curt Bears aware.

## 2018-06-16 NOTE — Discharge Instructions (Signed)
° ° °  Supplemental Discharge Instructions for  Pacemaker/Defibrillator Patients  Activity No heavy lifting or vigorous activity with your left/right arm for 6 to 8 weeks.  Do not raise your left/right arm above your head for one week.  Gradually raise your affected arm as drawn below.             06/20/2018                   06/21/2018                  06/22/2018                 06/23/2018 __  NO DRIVING for until cleared to at your wound check visit.  WOUND CARE - Keep the wound area clean and dry.  Do not get this area wet, no showers until cleared to at your wound check visit - The tape/steri-strips on your wound will fall off; do not pull them off.  No bandage is needed on the site.  DO  NOT apply any creams, oils, or ointments to the wound area. - If you notice any drainage or discharge from the wound, any swelling or bruising at the site, or you develop a fever > 101? F after you are discharged home, call the office at once.  Special Instructions - You are still able to use cellular telephones; use the ear opposite the side where you have your pacemaker/defibrillator.  Avoid carrying your cellular phone near your device. - When traveling through airports, show security personnel your identification card to avoid being screened in the metal detectors.  Ask the security personnel to use the hand wand. - Avoid arc welding equipment, MRI testing (magnetic resonance imaging), TENS units (transcutaneous nerve stimulators).  Call the office for questions about other devices. - Avoid electrical appliances that are in poor condition or are not properly grounded. - Microwave ovens are safe to be near or to operate.  Additional information for defibrillator patients should your device go off: - If your device goes off ONCE and you feel fine afterward, notify the device clinic nurses. - If your device goes off ONCE and you do not feel well afterward, call 911. - If your device goes off TWICE, call  911. - If your device goes off THREE times in one day, call 911.  DO NOT DRIVE YOURSELF OR A FAMILY MEMBER WITH A DEFIBRILLATOR TO THE HOSPITAL--CALL 911.

## 2018-06-16 NOTE — H&P (Signed)
ICD Criteria  Current LVEF:20-25%. Within 12 months prior to implant: Yes   Heart failure history: Yes, Class II  Cardiomyopathy history: Yes, Ischemic Cardiomyopathy - Prior MI.  Atrial Fibrillation/Atrial Flutter: No.  Ventricular tachycardia history: No.  Cardiac arrest history: No.  History of syndromes with risk of sudden death: No.  Previous ICD: No.  Current ICD indication: Primary  PPM indication: No.  Class I or II Bradycardia indication present: No  Beta Blocker therapy for 3 or more months: Yes, prescribed.   Ace Inhibitor/ARB therapy for 3 or more months: No, medical reason.   I have seen Jonathan Floyd is a 73 y.o. malepre-procedural and has been referred by [facility] for consideration of ICD implant for primary prevention of sudden death.  The patient's chart has been reviewed and they meet criteria for ICD implant.  I have had a thorough discussion with the patient reviewing options.  The patient and their family (if available) have had opportunities to ask questions and have them answered. The patient and I have decided together through the [employer] to implant ICD at this time.  Risks, benefits, alternatives to ICD implantation were discussed in detail with the patient today. The patient  understands that the risks include but are not limited to bleeding, infection, pneumothorax, perforation, tamponade, vascular damage, renal failure, MI, stroke, death, inappropriate shocks, and lead dislodgement and  wishes to proceed.

## 2018-06-16 NOTE — Progress Notes (Addendum)
Patient had 2 runs of wide complex tachycardia. The first run 2 beats then break with 7 beats irregular rate. 2nd was 8 beats regular rate around 150 bpm. Notified Ignacia Bayley NP and he ordered some lab work. Dr Curt Bears notified and ordered a portable chest xray and orders put into Epic. 1845. Dr. Curt Bears into evaluate patients cardiac monitor history. Patient has been asymptomatic with the rhythms.

## 2018-06-17 ENCOUNTER — Encounter (HOSPITAL_COMMUNITY): Payer: Self-pay | Admitting: Cardiology

## 2018-06-17 ENCOUNTER — Ambulatory Visit (HOSPITAL_COMMUNITY): Payer: No Typology Code available for payment source

## 2018-06-17 DIAGNOSIS — R110 Nausea: Secondary | ICD-10-CM | POA: Diagnosis not present

## 2018-06-17 DIAGNOSIS — I255 Ischemic cardiomyopathy: Secondary | ICD-10-CM

## 2018-06-17 DIAGNOSIS — Z006 Encounter for examination for normal comparison and control in clinical research program: Secondary | ICD-10-CM | POA: Diagnosis not present

## 2018-06-17 DIAGNOSIS — I5022 Chronic systolic (congestive) heart failure: Secondary | ICD-10-CM | POA: Diagnosis not present

## 2018-06-17 DIAGNOSIS — I130 Hypertensive heart and chronic kidney disease with heart failure and stage 1 through stage 4 chronic kidney disease, or unspecified chronic kidney disease: Secondary | ICD-10-CM | POA: Diagnosis not present

## 2018-06-17 DIAGNOSIS — I447 Left bundle-branch block, unspecified: Secondary | ICD-10-CM | POA: Diagnosis not present

## 2018-06-17 LAB — BASIC METABOLIC PANEL
Anion gap: 8 (ref 5–15)
BUN: 13 mg/dL (ref 8–23)
CO2: 24 mmol/L (ref 22–32)
Calcium: 9.3 mg/dL (ref 8.9–10.3)
Chloride: 105 mmol/L (ref 98–111)
Creatinine, Ser: 1.6 mg/dL — ABNORMAL HIGH (ref 0.61–1.24)
GFR calc Af Amer: 50 mL/min — ABNORMAL LOW (ref >60)
GFR, EST NON AFRICAN AMERICAN: 43 mL/min — AB (ref >60)
Glucose, Bld: 165 mg/dL — ABNORMAL HIGH (ref 70–99)
Potassium: 4.3 mmol/L (ref 3.5–5.1)
Sodium: 137 mmol/L (ref 135–145)

## 2018-06-17 LAB — GLUCOSE, CAPILLARY
Glucose-Capillary: 133 mg/dL — ABNORMAL HIGH (ref 70–99)
Glucose-Capillary: 201 mg/dL — ABNORMAL HIGH (ref 70–99)
Glucose-Capillary: 142 mg/dL — ABNORMAL HIGH (ref 70–99)
Glucose-Capillary: 107 mg/dL — ABNORMAL HIGH (ref 70–99)

## 2018-06-17 MED ORDER — ONDANSETRON HCL 4 MG/2ML IJ SOLN
4.0000 mg | Freq: Once | INTRAMUSCULAR | Status: AC
  Administered 2018-06-17: 4 mg via INTRAVENOUS

## 2018-06-17 MED FILL — Lidocaine HCl Local Inj 1%: INTRAMUSCULAR | Qty: 60 | Status: AC

## 2018-06-17 NOTE — Progress Notes (Signed)
Orthopedic Tech Progress Note Patient Details:  Jonathan Floyd January 21, 1946 [id number]  RN said patient has arm sling   Patient ID: Jonathan Floyd, male   DOB: [birth year], 73 y.o.   MRN: [id number]   Janit Pagan 06/17/2018, 8:12 AM

## 2018-06-17 NOTE — Progress Notes (Signed)
Patient again c/o nausea and retching.  At about the same time, experienced 4 beat followed by 12 runs of VT.  Dr. Curt Bears aware.

## 2018-06-17 NOTE — Progress Notes (Signed)
Dr. Curt Bears has seen patient this AM Small hematoma noted at implant site.  Pressure dressing applied, patient tolerated very well Reports his nausea this AM is improving s/p zofran Will return in a few hours to reassess with plans for discharge  Tommye Standard, PA-C

## 2018-06-17 NOTE — Progress Notes (Addendum)
Progress Note  Patient Name: Jonathan Floyd Date of Encounter: 06/17/2018  Primary Cardiologist: Carlyle Dolly, MD   Subjective   Remains nausea, unable to eat his lunch  Inpatient Medications    Scheduled Meds: . aspirin EC  81 mg Oral Daily  . atorvastatin  10 mg Oral QHS  . carvedilol  25 mg Oral BID  . finasteride  5 mg Oral Daily  . hydrALAZINE  25 mg Oral TID  . insulin aspart  0-15 Units Subcutaneous TID WC  . insulin glargine  30 Units Subcutaneous QHS  . isosorbide dinitrate  20 mg Oral TID  . potassium & sodium phosphates  1 packet Oral BID  . Vitamin D (Ergocalciferol)  50,000 Units Oral Once per day on Sun Wed   Continuous Infusions:  PRN Meds: acetaminophen, nitroGLYCERIN, ondansetron (ZOFRAN) IV, torsemide   Vital Signs    Vitals:   06/17/18 1230 06/17/18 1231 06/17/18 1512 06/17/18 1515  BP: (!) 163/88 (!) 163/88 131/76 131/76  Pulse:  83 74   Resp: (!) 24 13 (!) 23 (!) 22  Temp:  98.2 F (36.8 C) 97.9 F (36.6 C)   TempSrc:  Oral Oral   SpO2:  94% 100%   Weight:      Height:        Intake/Output Summary (Last 24 hours) at 06/17/2018 1523 Last data filed at 06/17/2018 0900 Gross per 24 hour  Intake 530 ml  Output 500 ml  Net 30 ml   Last 3 Weights 06/17/2018 06/16/2018 05/31/2018  Weight (lbs) 235 lb 7.2 oz 234 lb 229 lb  Weight (kg) 106.8 kg 106.142 kg 103.874 kg      Telemetry    SR/VP, NSVT - Personally Reviewed  ECG    SR/VP - Personally Reviewed  Physical Exam   GEN: No acute distress.   Neck: No JVD Cardiac: RRR, no murmurs, rubs, or gallops.  Respiratory:CTA b/l. GI: Soft, nontender, non-distended  MS: No edema; No deformity. Neuro:  Nonfocal  Psych: Normal affect   Implant site with small-mod hematoma, ecchymosis laterally  Labs    Chemistry Recent Labs  Lab 06/16/18 1834  NA 137  K 3.7  CL 103  CO2 24  GLUCOSE 224*  BUN 15  CREATININE 1.38*  CALCIUM 9.4  GFRNONAA 51*  GFRAA 60*  ANIONGAP 10      HematologyNo results for input(s): WBC, RBC, HGB, HCT, MCV, MCH, MCHC, RDW, PLT in the last 168 hours.  Cardiac EnzymesNo results for input(s): TROPONINI in the last 168 hours. No results for input(s): TROPIPOC in the last 168 hours.   BNPNo results for input(s): BNP, PROBNP in the last 168 hours.   DDimer No results for input(s): DDIMER in the last 168 hours.   Radiology    Dg Chest 2 View  Result Date: 06/17/2018 CLINICAL DATA:  Defibrillator placement EXAM: CHEST - 2 VIEW COMPARISON:  Yesterday FINDINGS: Biventricular pacer/defibrillator from the left with leads in unremarkable position. Gaseous distension of the stomach below the chronically elevated left diaphragm. Atelectasis over the left diaphragm. No edema, effusion, or pneumothorax. Stable enlarged heart size. IMPRESSION: Recent biventricular pacer placement without acute finding. Electronically Signed   By: Monte Fantasia M.D.   On: 06/17/2018 08:33   Dg Chest Port 1 View  Result Date: 06/16/2018 CLINICAL DATA:  Pacemaker insertion, left shoulder pain EXAM: PORTABLE CHEST 1 VIEW COMPARISON:  12/02/2017 FINDINGS: Left subclavian 3 lead pacer noted. Other monitor leads overlie the chest. Negative for pneumothorax.  Heart is enlarged with vascular congestion and basilar atelectasis. Small effusions not excluded. Aorta atherosclerotic. Degenerative changes of the spine. IMPRESSION: Status post left subclavian pacemaker.  Negative for pneumothorax. Cardiomegaly with vascular congestion Low lung volumes with left basilar atelectasis Electronically Signed   By: Jerilynn Mages.  Shick M.D.   On: 06/16/2018 19:15    Cardiac Studies     Patient Profile     73 y.o. male with PMHx of CAD, HTN, HLD, CKD, ICM, LBBB, chronic CHF, s/p CRT-D implant yesterday   Assessment & Plan    1. Persistent nausea of unclear cause.  Dr. Curt Bears aware     Feels weak with the nausea though no vomiting since this AM     He reports historically with his cath  procedure, he had some N/V after though only for a couple hours, not as much at this time     BP has been stable     No CP, no SOB, no symptoms of his angina    Raynetta Osterloh hold his discharge, try to advance diet Continue zofran PRN  2. ICM 3. Chronic CHF     Does not appear fluid OL  4. ICD implant yesterday     Site with mild-mod hematoma, pressure dressing applied     Device check this AM with intact/stable findings     CXR without ptx     Duval Macleod review wound care and activity instructions tomorrow/when feeling better   For questions or updates, please contact [place] Please consult [email or web address] for contact info under        Signed, Baldwin Jamaica, PA-C  06/17/2018, 3:23 PM    I have seen and examined this patient with Tommye Standard.  Agree with above, note added to reflect my findings.  On exam, RRR, no murmurs, lungs clear.   [employer] CRT-D implanted yesterday.  Today has developed worsening nausea without vomiting.  We Kamarri Lovvorn continue to follow nausea and vomiting.  We Josanna Hefel check a basic metabolic to see if he has any further electrolyte issues.  Yaelis Scharfenberg treat with Zofran today.  Jamesina Gaugh M. Tyshawna Alarid MD 06/17/2018 3:33 PM

## 2018-06-17 NOTE — Progress Notes (Addendum)
Patient having frequent runs of non-sustained VT, as much as 21 beats.  Asymptomatic.  Dr. Curt Bears informed.

## 2018-06-18 DIAGNOSIS — I5022 Chronic systolic (congestive) heart failure: Secondary | ICD-10-CM | POA: Diagnosis not present

## 2018-06-18 DIAGNOSIS — I255 Ischemic cardiomyopathy: Secondary | ICD-10-CM | POA: Diagnosis not present

## 2018-06-18 LAB — GLUCOSE, CAPILLARY: Glucose-Capillary: 104 mg/dL — ABNORMAL HIGH (ref 70–99)

## 2018-06-18 NOTE — Care Management Note (Addendum)
Case Management Note  Patient Details  Name: Jonathan Floyd MRN: [id number] Date of Birth: 02-28-1946  Subjective/Objective:   From home with wife, s/p BIV ICD insertion.  He goes to [facility] 540 [phone] ext [phone]. NCM will contact to get PCP name and fax number to fax dc summary to [facility] PCP.  Rep said to call to [facility] at [phone]  And his PCP is Dr. Raoul Pitch.  The fax number is [phone]. This NCM will fax the dc summary to them.  Also patient states he needs refill on his carvedilol.  This NCM gave Nere RN this phone number to give to patient to contact his PCP to get his refill.                Action/Plan: DC home when ready.  Expected Discharge Date:  06/18/18               Expected Discharge Plan:  Home/Self Care  In-House Referral:     Discharge planning Services  CM Consult  Post Acute Care Choice:    Choice offered to:     DME Arranged:    DME Agency:     HH Arranged:    HH Agency:     Status of Service:  Completed, signed off  If discussed at [employer] of Stay Meetings, dates discussed:    Additional Comments:  Zenon Mayo, RN 06/18/2018, 9:11 AM

## 2018-06-22 ENCOUNTER — Encounter (HOSPITAL_COMMUNITY): Payer: Self-pay | Admitting: Cardiology

## 2018-06-22 ENCOUNTER — Telehealth: Payer: Self-pay | Admitting: Cardiology

## 2018-06-22 NOTE — Telephone Encounter (Signed)
Patient can not send a remote transmission b/c his home monitor does not work w/ the cell adapter and they do not have a land line phone. Pt is waiting for a Internet adapter to be ordered and mailed to him.

## 2018-06-22 NOTE — Telephone Encounter (Signed)
LMOM asking if patient has not yet gone to ED to call our office--will ask which ED he is going to so that I can make [facility] rep aware.

## 2018-06-22 NOTE — Telephone Encounter (Signed)
Patient wife called back and stated that pt received a shock. After consulting w/ Device Tech RN I instructed pt wife to call 9-1-1 or go the the ER. But pt is not to drive himself. Pt wife verbalized understanding.

## 2018-06-23 ENCOUNTER — Telehealth: Payer: Self-pay | Admitting: Cardiology

## 2018-06-23 NOTE — Telephone Encounter (Signed)
F/U Message        Patient returned your call, would like a call back.

## 2018-06-28 ENCOUNTER — Ambulatory Visit (INDEPENDENT_AMBULATORY_CARE_PROVIDER_SITE_OTHER): Payer: Medicare Other | Admitting: Nurse Practitioner

## 2018-06-28 DIAGNOSIS — I5022 Chronic systolic (congestive) heart failure: Secondary | ICD-10-CM

## 2018-06-28 LAB — CUP PACEART INCLINIC DEVICE CHECK
Date Time Interrogation Session: 20200210091539
Implantable Lead Implant Date: 20200129
Implantable Lead Implant Date: 20200129
Implantable Lead Location: 753858
Implantable Lead Location: 753859
Implantable Lead Location: 753860
Implantable Pulse Generator Implant Date: 20200129
MDC IDC LEAD IMPLANT DT: 20200129
Pulse Gen Serial Number: 9877212

## 2018-06-28 NOTE — Progress Notes (Signed)
Wound check appointment. Steri-strips removed. Wound without redness or edema. Incision edges approximated, wound well healed. Normal device function. Thresholds, sensing, and impedances consistent with implant measurements. Device programmed at 3.5V for extra safety margin until 3 month visit. Histogram distribution appropriate for patient and level of activity. No ventricular arrhythmias noted. AMS episodes are <5 minutes. Patient educated about wound care, arm mobility, lifting restrictions, shock plan. ROV in 3 months with implanting physician.

## 2018-06-28 NOTE — Patient Instructions (Signed)
Medication Instructions:  none If you need a refill on your cardiac medications before your next appointment, please call your pharmacy.   Lab work: none If you have labs (blood work) drawn today and your tests are completely normal, you will receive your results only by: Marland Kitchen MyChart Message (if you have MyChart) OR . A paper copy in the mail If you have any lab test that is abnormal or we need to change your treatment, we will call you to review the results.  Testing/Procedures: none  Follow-Up: 3 months with Dr Rayann Heman in [facility])  At [facility], you and your health needs are our priority.  As part of our continuing mission to provide you with exceptional heart care, we have created designated Provider Care Teams.  These Care Teams include your primary Cardiologist (physician) and Advanced Practice Providers (APPs -  Physician Assistants and Nurse Practitioners) who all work together to provide you with the care you need, when you need it. .   Any Other Special Instructions Will Be Listed Below (If Applicable).

## 2018-06-29 ENCOUNTER — Telehealth: Payer: Self-pay | Admitting: Cardiology

## 2018-06-29 NOTE — Telephone Encounter (Signed)
New Message         Patient would like a call back concerning paperwork.

## 2018-07-01 ENCOUNTER — Encounter: Payer: No Typology Code available for payment source | Admitting: Nurse Practitioner

## 2018-07-09 ENCOUNTER — Telehealth (HOSPITAL_COMMUNITY): Payer: Self-pay | Admitting: Cardiology

## 2018-07-09 NOTE — Telephone Encounter (Signed)
Patient sending manual transmissions daily. Will advise patient that manual transmissions are not necessary unless he is having symptoms (needs to call us) or if a scheduled transmission is not received.

## 2018-07-09 NOTE — Telephone Encounter (Signed)
LMOM- requested call back to device clinic as earliest convience

## 2018-07-13 NOTE — Telephone Encounter (Signed)
2nd attempt  LMOVM for pt to return call.  

## 2018-07-14 ENCOUNTER — Encounter: Payer: Self-pay | Admitting: Cardiology

## 2018-07-14 ENCOUNTER — Ambulatory Visit (INDEPENDENT_AMBULATORY_CARE_PROVIDER_SITE_OTHER): Payer: Medicare Other | Admitting: Cardiology

## 2018-07-14 VITALS — BP 138/80 | HR 83 | Ht 69.0 in | Wt 230.0 lb

## 2018-07-14 DIAGNOSIS — I5022 Chronic systolic (congestive) heart failure: Secondary | ICD-10-CM | POA: Diagnosis not present

## 2018-07-14 DIAGNOSIS — I10 Essential (primary) hypertension: Secondary | ICD-10-CM

## 2018-07-14 DIAGNOSIS — E782 Mixed hyperlipidemia: Secondary | ICD-10-CM | POA: Diagnosis not present

## 2018-07-14 DIAGNOSIS — I2510 Atherosclerotic heart disease of native coronary artery without angina pectoris: Secondary | ICD-10-CM | POA: Diagnosis not present

## 2018-07-14 NOTE — Patient Instructions (Signed)
Your physician recommends that you schedule a follow-up appointment in: 3 MONTHS WITH DR BRANCH  Your physician recommends that you continue on your current medications as directed. Please refer to the Current Medication list given to you today.  Thank you for choosing [facility]!!    

## 2018-07-14 NOTE — Telephone Encounter (Signed)
Spoke w/ pt and informed him that he doesn't have to send manual transmission everyday. Instructed him to only send if he is having issues and to call the office w/ the symptoms. Pt verbalized understanding.

## 2018-07-14 NOTE — Progress Notes (Signed)
Clinical Summary Jonathan Floyd is a 73 y.o.male  1. Chronic systolic HF - echo 05/8297 LVEF 37-16%, grade I diastolic dysfunction - admit 11/2017 with fluid overload with medication and dietaery noncompliance. - approx 6-7 year history. They think LVEF 30-35% a few years ago.     -Jan 29/2020 had BiV AICD placed - breathing is improving. No recent edema - compliant with meds He reports low bp's on higher hydral doses in the past.   2. History of CAD - history of prior cardiac stents done through [place]. Last in 2017.  - he has a stent card: Nov 2017 xience to prox LAD - EKG with chronic LBBB  -02/2018 nuclear stress large scar as reported below, no significant ischemia.  - denies any recent chest pain.   3. HTN - compliant with meds  4. Hyperlipidemia - labs followed by [facility]  5. CKD III - history of nephrectomy. Followed by Dr Hinda Lenis nephrology   Past Medical History:  Diagnosis Date  . AICD (automatic cardioverter/defibrillator) present 06/16/2018  . Cancer ([place])    43 years ago- kidney taken out for it  . Chronic systolic CHF (congestive heart failure) ([place])   . CKD (chronic kidney disease), stage III ([place])    1 kidney  . Coronary artery disease 2017   DES pLAD, [place]. 2 other stents in 2014  . Diabetes mellitus without complication ([place])   . Hypertension   . Renal insufficiency      Allergies  Allergen Reactions  . Iodine Other (See Comments)    Only has one kidney     Current Outpatient Medications  Medication Sig Dispense Refill  . acetaminophen (TYLENOL) 500 MG tablet Take 1,000 mg by mouth every 6 (six) hours as needed for mild pain or headache.     Marland Kitchen aspirin EC 81 MG tablet Take 81 mg by mouth daily.    Marland Kitchen atorvastatin (LIPITOR) 10 MG tablet Take 10 mg by mouth at bedtime.     . carvedilol (COREG) 25 MG tablet Take 1 tablet (25 mg total) by mouth 2 (two) times daily. 180 tablet 1  . chlorhexidine (HIBICLENS) 4 % external  liquid Apply 1 application topically See admin instructions. Apply the night before and the morning of the procedure    . ergocalciferol (VITAMIN D2) 50000 units capsule Take 50,000 Units by mouth 2 (two) times a week. Sunday and Wednesday    . finasteride (PROSCAR) 5 MG tablet Take 5 mg by mouth daily.    . hydrALAZINE (APRESOLINE) 50 MG tablet Take 25 mg by mouth 3 (three) times daily.    . insulin glargine (LANTUS) 100 UNIT/ML injection Inject 30 Units into the skin at bedtime.    . isosorbide dinitrate (ISORDIL) 10 MG tablet Take 20 mg by mouth 3 (three) times daily.    . nitroGLYCERIN (NITROSTAT) 0.4 MG SL tablet Place 0.4 mg under the tongue every 5 (five) minutes as needed for chest pain.    . potassium & sodium phosphates (PHOS-NAK) 280-160-250 MG PACK Take 1 packet by mouth 2 (two) times daily.    Marland Kitchen torsemide (DEMADEX) 20 MG tablet Take 20 mg by mouth daily as needed (for fluid).      No current facility-administered medications for this visit.      Past Surgical History:  Procedure Laterality Date  . BIV ICD INSERTION CRT-D N/A 06/16/2018   Procedure: BIV ICD INSERTION CRT-D;  Surgeon: Constance Haw, MD;  Location: [place]  CV LAB;  Service: Cardiovascular;  Laterality: N/A;  . CORONARY ANGIOPLASTY WITH STENT PLACEMENT    . ICD IMPLANT  03/17/2019   BIV  . KNEE ARTHROPLASTY    . NEPHRECTOMY  1976     Allergies  Allergen Reactions  . Iodine Other (See Comments)    Only has one kidney      Family History  Problem Relation Age of Onset  . Dementia Mother   . Diabetes Mother   . Heart attack Father   . Heart disease Father   . Hypertension Father      Social History Mr. Class reports that he quit smoking about 31 years ago. He has never used smokeless tobacco. [name] reports current alcohol use.   Review of Systems CONSTITUTIONAL: No weight loss, fever, chills, weakness or fatigue.  HEENT: Eyes: No visual loss, blurred vision, double vision or  yellow sclerae.No hearing loss, sneezing, congestion, runny nose or sore throat.  SKIN: No rash or itching.  CARDIOVASCULAR: per hpi RESPIRATORY: No shortness of breath, cough or sputum.  GASTROINTESTINAL: No anorexia, nausea, vomiting or diarrhea. No abdominal pain or blood.  GENITOURINARY: No burning on urination, no polyuria NEUROLOGICAL: No headache, dizziness, syncope, paralysis, ataxia, numbness or tingling in the extremities. No change in bowel or bladder control.  MUSCULOSKELETAL: No muscle, back pain, joint pain or stiffness.  LYMPHATICS: No enlarged nodes. No history of splenectomy.  PSYCHIATRIC: No history of depression or anxiety.  ENDOCRINOLOGIC: No reports of sweating, cold or heat intolerance. No polyuria or polydipsia.  Marland Kitchen   Physical Examination Vitals:   07/14/18 1317  BP: 138/80  Pulse: 83  SpO2: 94%   Vitals:   07/14/18 1317  Weight: 230 lb (104.3 kg)  Height: 5\' 9"  (1.753 m)    Gen: resting comfortably, no acute distress HEENT: no scleral icterus, pupils equal round and reactive, no palptable cervical adenopathy,  CV: RRR, no m/r/g, no jvd Resp: Clear to auscultation bilaterally GI: abdomen is soft, non-tender, non-distended, normal bowel sounds, no hepatosplenomegaly MSK: extremities are warm, mild LLE edema Skin: warm, no rash Neuro:  no focal deficits Psych: appropriate affect   Diagnostic Studies 02/2018 nuclear stress  Defect 1: There is a large defect of moderate severity present in the basal inferoseptal, basal inferior, mid anteroseptal, mid inferoseptal, mid inferior, apical septal and apical inferior location.  Findings consistent with prior myocardial infarction. No significant ischemic zones.  This is a high risk study based on degree of scar and significantly depressed LVEF.  Nuclear stress EF: 31%.    11/2017 echo Study Conclusions  - Left ventricle: The cavity size was mildly dilated. Wall thickness was increased in a  pattern of moderate LVH. Systolic function was severely reduced. The estimated ejection fraction was in the range of 20% to 25%. Diffuse hypokinesis. Doppler parameters are consistent with abnormal left ventricular relaxation (grade 1 diastolic dysfunction). Doppler parameters are consistent with high ventricular filling pressure. - Aortic valve: There was mild regurgitation. - Mitral valve: There was mild regurgitation. - Left atrium: The atrium was mildly dilated. - Pulmonary arteries: Systolic pressure was mildly increased. PA peak pressure: 39 mm Hg (S).  Impressions:  - Severe global reduction in LV systolic function; moderate LVH; mild LVE; mild diastolic dysfunction; mild AI and MR; mild LAE; trace TR with mild pulmonary hypertension.  04/2018 echo Study Conclusions  - Left ventricle: The cavity size was normal. Wall thickness was   increased in a pattern of mild LVH. Systolic function  was   severely reduced. The estimated ejection fraction was in the   range of 20% to 25%. Diffuse hypokinesis. Doppler parameters are   consistent with abnormal left ventricular relaxation (grade 1   diastolic dysfunction). Doppler parameters are consistent with   indeterminate ventricular filling pressure. - Regional wall motion abnormality: Akinesis of the basal-mid   anteroseptal myocardium. - Aortic valve: Trileaflet; mildly thickened, mildly calcified   leaflets. There was mild stenosis (low output, low gradient).   There was mild regurgitation. Valve area (VTI): 1.72 cm^2. Valve   area (Vmax): 1.69 cm^2. Valve area (Vmean): 1.66 cm^2. - Mitral valve: Mildly calcified annulus. Mildly thickened leaflets   . There was moderate regurgitation. - Left atrium: The atrium was moderately dilated. - Tricuspid valve: There was mild-moderate regurgitation. - Pulmonary arteries: PA peak pressure: 36 mm Hg (S).  Assessment and Plan  1. Chronic systolic HF/CAD - medical  therapy limited by renal dysfunction, not on ACEI/ARB/aldactone/arni - he reports low bp's on higher hydral doses, continue current dose for now - continue current meds  2. CAD - no symptoms, continue current meds  3. HTN -continue current meds  4. Hyperlipidemia - request labs from [place] - continue statin    Arnoldo Lenis, M.D.,

## 2018-09-03 ENCOUNTER — Encounter: Payer: Self-pay | Admitting: *Deleted

## 2018-09-15 ENCOUNTER — Telehealth: Payer: Self-pay

## 2018-09-15 ENCOUNTER — Other Ambulatory Visit: Payer: Self-pay

## 2018-09-15 ENCOUNTER — Ambulatory Visit (INDEPENDENT_AMBULATORY_CARE_PROVIDER_SITE_OTHER): Payer: Medicare Other | Admitting: *Deleted

## 2018-09-15 DIAGNOSIS — I429 Cardiomyopathy, unspecified: Secondary | ICD-10-CM

## 2018-09-15 DIAGNOSIS — I5022 Chronic systolic (congestive) heart failure: Secondary | ICD-10-CM | POA: Diagnosis not present

## 2018-09-15 LAB — CUP PACEART REMOTE DEVICE CHECK
Battery Remaining Longevity: 71 mo
Battery Remaining Percentage: 95 %
Battery Voltage: 3.17 V
Brady Statistic AP VP Percent: 5.8 %
Brady Statistic AP VS Percent: 1 %
Brady Statistic AS VP Percent: 93 %
Brady Statistic AS VS Percent: 1 %
Brady Statistic RA Percent Paced: 5.2 %
Date Time Interrogation Session: 20200429060021
HighPow Impedance: 73 Ohm
HighPow Impedance: 73 Ohm
Implantable Lead Implant Date: 20200129
Implantable Lead Implant Date: 20200129
Implantable Lead Implant Date: 20200129
Implantable Lead Location: 753858
Implantable Lead Location: 753859
Implantable Lead Location: 753860
Implantable Pulse Generator Implant Date: 20200129
Lead Channel Impedance Value: 1050 Ohm
Lead Channel Impedance Value: 450 Ohm
Lead Channel Impedance Value: 510 Ohm
Lead Channel Pacing Threshold Amplitude: 0.5 V
Lead Channel Pacing Threshold Amplitude: 0.625 V
Lead Channel Pacing Threshold Amplitude: 1.875 V
Lead Channel Pacing Threshold Pulse Width: 0.5 ms
Lead Channel Pacing Threshold Pulse Width: 0.5 ms
Lead Channel Pacing Threshold Pulse Width: 0.5 ms
Lead Channel Sensing Intrinsic Amplitude: 12 mV
Lead Channel Sensing Intrinsic Amplitude: 5 mV
Lead Channel Setting Pacing Amplitude: 2 V
Lead Channel Setting Pacing Amplitude: 2.875
Lead Channel Setting Pacing Amplitude: 3.5 V
Lead Channel Setting Pacing Pulse Width: 0.5 ms
Lead Channel Setting Pacing Pulse Width: 0.5 ms
Lead Channel Setting Sensing Sensitivity: 0.5 mV
Pulse Gen Serial Number: 9877212

## 2018-09-15 NOTE — Telephone Encounter (Signed)
Left message regarding appt on 09/17/18. 

## 2018-09-16 NOTE — Telephone Encounter (Signed)
New message   Please call patient back

## 2018-09-16 NOTE — Telephone Encounter (Signed)
Spoke with pt and pts wife regarding appt on 09/17/18. Pt stated he will check vitals prior to appt. Pt concerns were address.

## 2018-09-17 ENCOUNTER — Telehealth (INDEPENDENT_AMBULATORY_CARE_PROVIDER_SITE_OTHER): Payer: Medicare Other | Admitting: Internal Medicine

## 2018-09-17 ENCOUNTER — Encounter: Payer: Self-pay | Admitting: Internal Medicine

## 2018-09-17 VITALS — BP 170/67 | HR 60 | Wt 230.0 lb

## 2018-09-17 DIAGNOSIS — I5022 Chronic systolic (congestive) heart failure: Secondary | ICD-10-CM

## 2018-09-17 DIAGNOSIS — I10 Essential (primary) hypertension: Secondary | ICD-10-CM

## 2018-09-17 DIAGNOSIS — I2510 Atherosclerotic heart disease of native coronary artery without angina pectoris: Secondary | ICD-10-CM

## 2018-09-17 DIAGNOSIS — I110 Hypertensive heart disease with heart failure: Secondary | ICD-10-CM

## 2018-09-17 NOTE — Progress Notes (Signed)
Electrophysiology TeleHealth Note   Due to national recommendations of social distancing due to COVID 19, an audio/video telehealth visit is felt to be most appropriate for this patient at this time.  See MyChart message from today for the patient's consent to telehealth for Jonathan Floyd.   Date:  09/17/2018   ID:  Sharyn Dross, DOB [birth year], MRN [id number]  Location: patient's home  Provider location: [address] [place]  Evaluation Performed: Follow-up visit  PCP:  [facility] [place]  Cardiologist:  Carlyle Dolly, MD  Electrophysiologist:  Dr Rayann Heman  Chief Complaint:  CHF  History of Present Illness:    Jonathan Floyd is a 73 y.o. male who presents via audio/video conferencing for a telehealth visit today.  Since last being seen in our clinic, the patient reports doing very well.  He feels "much better" with CRT.   SOB is stable.  He has chronic renal disease.  Today, he denies symptoms of palpitations, chest pain,   lower extremity edema, dizziness, presyncope, or syncope.  The patient is otherwise without complaint today.  The patient denies symptoms of fevers, chills, cough, or new SOB worrisome for COVID 19.  Past Medical History:  Diagnosis Date  . AICD (automatic cardioverter/defibrillator) present 06/16/2018  . Cancer ([name])    43 years ago- kidney taken out for it  . Chronic systolic CHF (congestive heart failure) ([place])   . CKD (chronic kidney disease), stage III ([place])    1 kidney  . Coronary artery disease 2017   DES pLAD, [place]. 2 other stents in 2014  . Diabetes mellitus without complication ([place])   . Hypertension   . Renal insufficiency     Past Surgical History:  Procedure Laterality Date  . BIV ICD INSERTION CRT-D N/A 06/16/2018   Procedure: BIV ICD INSERTION CRT-D;  Surgeon: Constance Haw, MD;  Location: [facility];  Service: Cardiovascular;  Laterality: N/A;  . CORONARY ANGIOPLASTY WITH STENT PLACEMENT    . ICD  IMPLANT  03/17/2019   BIV  . KNEE ARTHROPLASTY    . NEPHRECTOMY  1976    Current Outpatient Medications  Medication Sig Dispense Refill  . acetaminophen (TYLENOL) 500 MG tablet Take 1,000 mg by mouth every 6 (six) hours as needed for mild pain or headache.     Marland Kitchen aspirin EC 81 MG tablet Take 81 mg by mouth daily.    Marland Kitchen atorvastatin (LIPITOR) 10 MG tablet Take 10 mg by mouth at bedtime.     . carvedilol (COREG) 12.5 MG tablet Take 12.5 mg by mouth 2 (two) times daily with a meal.    . chlorhexidine (HIBICLENS) 4 % external liquid Apply 1 application topically See admin instructions. Apply the night before and the morning of the procedure    . ergocalciferol (VITAMIN D2) 50000 units capsule Take 50,000 Units by mouth 2 (two) times a week. Sunday and Wednesday    . finasteride (PROSCAR) 5 MG tablet Take 5 mg by mouth daily.    . hydrALAZINE (APRESOLINE) 50 MG tablet Take 25 mg by mouth 3 (three) times daily.    . insulin glargine (LANTUS) 100 UNIT/ML injection Inject 30 Units into the skin at bedtime.    . isosorbide dinitrate (ISORDIL) 10 MG tablet Take 20 mg by mouth 3 (three) times daily.    . nitroGLYCERIN (NITROSTAT) 0.4 MG SL tablet Place 0.4 mg under the tongue every 5 (five) minutes as needed for chest pain.    Marland Kitchen  potassium & sodium phosphates (PHOS-NAK) 280-160-250 MG PACK Take 1 packet by mouth 2 (two) times daily.    Marland Kitchen torsemide (DEMADEX) 20 MG tablet Take 20 mg by mouth daily as needed (for fluid).      No current facility-administered medications for this visit.     Allergies:   Iodine   Social History:  The patient  reports that he quit smoking about 31 years ago. He has never used smokeless tobacco. He reports current alcohol use. He reports previous drug use.   Family History:  The patient's  family history includes Dementia in his mother; Diabetes in his mother; Heart attack in his father; Heart disease in his father; Hypertension in his father.   ROS:  Please see the  history of present illness.   All other systems are personally reviewed and negative.    Exam:    Vital Signs:  BP (!) 170/67   Pulse 60   Wt 230 lb (104.3 kg)   BMI 33.97 kg/m   Well appearing, alert and conversant, regular work of breathing,  good skin color Eyes- anicteric, neuro- grossly intact, skin- no apparent rash or lesions or cyanosis, mouth- oral mucosa is pink He showed me his ICD device pocket by video and it is nicely healed   Labs/Other Tests and Data Reviewed:    Recent Labs: 11/29/2017: ALT 23; B Natriuretic Peptide 526.2 11/30/2017: TSH 1.995 05/31/2018: Hemoglobin 14.5; Platelets 185 06/16/2018: Magnesium 1.9 06/17/2018: BUN 13; Creatinine, Ser 1.60; Potassium 4.3; Sodium 137   Wt Readings from Last 3 Encounters:  09/17/18 230 lb (104.3 kg)  07/14/18 230 lb (104.3 kg)  06/18/18 232 lb 2.3 oz (105.3 kg)     Other studies personally reviewed: Additional studies/ records that were reviewed today include: Dr Curt Bears procedure note,  Dr Branches notes  Review of the above records today demonstrates: as above Prior radiographs: CXR 06/17/2018 reveals adequate device placement   Last device remote is reviewed from [facility] PDF dated 09/15/2018 which reveals normal device function    ASSESSMENT & PLAN:    1.  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  Normal device function Enroll in [facility] clinic  2. CAD No ischemic symptoms No changes  3. 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 COVID 19 screen The patient denies symptoms of COVID 19 at this time.  The importance of social distancing was discussed today.  Follow-up:  12 months with me Next remote: 09/2018  Current medicines are reviewed at length with the patient today.   The patient  does not have concerns regarding his medicines.  The following changes were made today:  none  Labs/ tests ordered today include:  No orders of the defined types were placed in this encounter.   Patient Risk:  after full review of this patients clinical status, I feel that they are at moderate risk at this time.  Today, I have spent 15 minutes with the patient with telehealth technology discussing CHF and HTN .    Army Fossa, MD  09/17/2018 11:01 AM     [facility] [address] [place] [address] [phone] (office) [phone] (fax)

## 2018-09-21 ENCOUNTER — Telehealth: Payer: Self-pay

## 2018-09-21 NOTE — Telephone Encounter (Signed)
Referred to ICM clinic by [facility].   Spoke with patient and agreeable to monthly follow up.  Advised monitor should be by bedside in order for it to automatically transmit a report during sleep hours of 12 midnight and 6 AM.  Patient confirmed monitor is at bedside. Advised will receive a call after the transmission is reviewed to provide results.  Explained a Remote Home Transmission will be seen as daytime appointment on office summary visit sheet but the time is not relevant since all transmissions are sent at night time so there is no obligation to stay by the monitor at that time appointed time during the day. Provided ICM number and explained should call if experiencing any fluid symptoms such as weight gain, shortness of breath or extremity/abdominal swelling.  He records daily weight and baseline is 229 lbs.  He takes PRN Torsemide for weight gain.  Denies any fluid symptoms at this time.  Remote transmission scheduled for 10/18/2018 which is day before visit with Dr Harl Bowie.

## 2018-09-24 NOTE — Progress Notes (Signed)
Remote ICD transmission.   

## 2018-09-28 ENCOUNTER — Encounter: Payer: No Typology Code available for payment source | Admitting: Cardiology

## 2018-10-18 ENCOUNTER — Ambulatory Visit (INDEPENDENT_AMBULATORY_CARE_PROVIDER_SITE_OTHER): Payer: Medicare Other

## 2018-10-18 DIAGNOSIS — I5022 Chronic systolic (congestive) heart failure: Secondary | ICD-10-CM

## 2018-10-18 DIAGNOSIS — Z95810 Presence of automatic (implantable) cardiac defibrillator: Secondary | ICD-10-CM

## 2018-10-19 ENCOUNTER — Telehealth (INDEPENDENT_AMBULATORY_CARE_PROVIDER_SITE_OTHER): Payer: Medicare Other | Admitting: Cardiology

## 2018-10-19 ENCOUNTER — Encounter: Payer: Self-pay | Admitting: Cardiology

## 2018-10-19 VITALS — BP 129/76 | HR 83 | Ht 69.0 in | Wt 230.0 lb

## 2018-10-19 DIAGNOSIS — Z95810 Presence of automatic (implantable) cardiac defibrillator: Secondary | ICD-10-CM

## 2018-10-19 DIAGNOSIS — I5022 Chronic systolic (congestive) heart failure: Secondary | ICD-10-CM

## 2018-10-19 DIAGNOSIS — I10 Essential (primary) hypertension: Secondary | ICD-10-CM

## 2018-10-19 DIAGNOSIS — I2510 Atherosclerotic heart disease of native coronary artery without angina pectoris: Secondary | ICD-10-CM

## 2018-10-19 DIAGNOSIS — E782 Mixed hyperlipidemia: Secondary | ICD-10-CM

## 2018-10-19 NOTE — Progress Notes (Signed)
Virtual Visit via Telephone Note   This visit type was conducted due to national recommendations for restrictions regarding the COVID-19 Pandemic (e.g. social distancing) in an effort to limit this patient's exposure and mitigate transmission in our community.  Due to his co-morbid illnesses, this patient is at least at moderate risk for complications without adequate follow up.  This format is felt to be most appropriate for this patient at this time.  The patient did not have access to video technology/had technical difficulties with video requiring transitioning to audio format only (telephone).  All issues noted in this document were discussed and addressed.  No physical exam could be performed with this format.  Please refer to the patient's chart for his  consent to telehealth for [facility].   Date:  10/19/2018   ID:  Jonathan Floyd, DOB 1945/05/25, MRN [id number]  Patient Location: Home Provider Location: Home  PCP:  [facility] [place]  Cardiologist:  Carlyle Dolly, MD  Electrophysiologist:  Constance Haw, MD   Evaluation Performed:  Follow-Up Visit  Chief Complaint:  3 month follow up  History of Present Illness:    Jonathan Floyd is a 73 y.o. male seen today for follow up of the following medical problems.    1. Chronic systolic HF - echo 0/7371 LVEF 06-26%, grade I diastolic dysfunction - admit 11/2017 with fluid overload with medication and dietaery noncompliance. - approx 6-7 year history. They think LVEF 30-35% a few years ago.     -Jan 29/2020 had BiV AICD placed. Normal device check during 09/17/2018 EP visit He reports low bp's on higher hydral doses in the past.   - no recent SOB/DOE. No LE edema. Weights stable stable around 230 lbs - compliant with meds. Takes torsemide just prn, roughly once a week   2. History of CAD - history of prior cardiac stents done through [place]. Last in 2017.  - he has a stent card: Nov 2017 xience to prox LAD -  EKG with chronic LBBB  -02/2018 nuclear stress large scar as reported below, no significant ischemia. - no recent chest pain  3. HTN -he is compliant with meds - bp today 129/76, p 83    4. Hyperlipidemia -labs followed by [facility]  5. CKD III - history of nephrectomy. Followed by Dr Hinda Lenis nephrology    The patient does not have symptoms concerning for COVID-19 infection (fever, chills, cough, or new shortness of breath).    Past Medical History:  Diagnosis Date  . AICD (automatic cardioverter/defibrillator) present 06/16/2018  . Cancer ([place])    43 years ago- kidney taken out for it  . Chronic systolic CHF (congestive heart failure) ([place])   . CKD (chronic kidney disease), stage III ([place])    1 kidney  . Coronary artery disease 2017   DES pLAD, [place]. 2 other stents in 2014  . Diabetes mellitus without complication ([place])   . Hypertension   . Renal insufficiency    Past Surgical History:  Procedure Laterality Date  . BIV ICD INSERTION CRT-D N/A 06/16/2018   Procedure: BIV ICD INSERTION CRT-D;  Surgeon: Constance Haw, MD;  Location: [facility];  Service: Cardiovascular;  Laterality: N/A;  . CORONARY ANGIOPLASTY WITH STENT PLACEMENT    . ICD IMPLANT  03/17/2019   BIV  . KNEE ARTHROPLASTY    . NEPHRECTOMY  1976     No outpatient medications have been marked as taking for the 10/19/18 encounter (Appointment) with  Arnoldo Lenis, MD.     Allergies:   Iodine   Social History   Tobacco Use  . Smoking status: Former Smoker    Last attempt to quit: 05/28/1987    Years since quitting: 31.4  . Smokeless tobacco: Never Used  Substance Use Topics  . Alcohol use: Yes    Comment: OCCASSIONAL  . Drug use: Not Currently     Family Hx: The patient's family history includes Dementia in his mother; Diabetes in his mother; Heart attack in his father; Heart disease in his father; Hypertension in his father.  ROS:   Please see the history of  present illness.     All other systems reviewed and are negative.   Prior CV studies:   The following studies were reviewed today:  02/2018 nuclear stress  Defect 1: There is a large defect of moderate severity present in the basal inferoseptal, basal inferior, mid anteroseptal, mid inferoseptal, mid inferior, apical septal and apical inferior location.  Findings consistent with prior myocardial infarction. No significant ischemic zones.  This is a high risk study based on degree of scar and significantly depressed LVEF.  Nuclear stress EF: 31%.    11/2017 echo Study Conclusions  - Left ventricle: The cavity size was mildly dilated. Wall thickness was increased in a pattern of moderate LVH. Systolic function was severely reduced. The estimated ejection fraction was in the range of 20% to 25%. Diffuse hypokinesis. Doppler parameters are consistent with abnormal left ventricular relaxation (grade 1 diastolic dysfunction). Doppler parameters are consistent with high ventricular filling pressure. - Aortic valve: There was mild regurgitation. - Mitral valve: There was mild regurgitation. - Left atrium: The atrium was mildly dilated. - Pulmonary arteries: Systolic pressure was mildly increased. PA peak pressure: 39 mm Hg (S).  Impressions:  - Severe global reduction in LV systolic function; moderate LVH; mild LVE; mild diastolic dysfunction; mild AI and MR; mild LAE; trace TR with mild pulmonary hypertension.  04/2018 echo Study Conclusions  - Left ventricle: The cavity size was normal. Wall thickness was increased in a pattern of mild LVH. Systolic function was severely reduced. The estimated ejection fraction was in the range of 20% to 25%. Diffuse hypokinesis. Doppler parameters are consistent with abnormal left ventricular relaxation (grade 1 diastolic dysfunction). Doppler parameters are consistent with indeterminate ventricular  filling pressure. - Regional wall motion abnormality: Akinesis of the basal-mid anteroseptal myocardium. - Aortic valve: Trileaflet; mildly thickened, mildly calcified leaflets. There was mild stenosis (low output, low gradient). There was mild regurgitation. Valve area (VTI): 1.72 cm^2. Valve area (Vmax): 1.69 cm^2. Valve area (Vmean): 1.66 cm^2. - Mitral valve: Mildly calcified annulus. Mildly thickened leaflets . There was moderate regurgitation. - Left atrium: The atrium was moderately dilated. - Tricuspid valve: There was mild-moderate regurgitation. - Pulmonary arteries: PA peak pressure: 36 mm Hg (S).  Labs/Other Tests and Data Reviewed:    EKG:  No ECG reviewed.  Recent Labs: 11/29/2017: ALT 23; B Natriuretic Peptide 526.2 11/30/2017: TSH 1.995 05/31/2018: Hemoglobin 14.5; Platelets 185 06/16/2018: Magnesium 1.9 06/17/2018: BUN 13; Creatinine, Ser 1.60; Potassium 4.3; Sodium 137   Recent Lipid Panel No results found for: CHOL, TRIG, HDL, CHOLHDL, LDLCALC, LDLDIRECT  Wt Readings from Last 3 Encounters:  09/17/18 230 lb (104.3 kg)  07/14/18 230 lb (104.3 kg)  06/18/18 232 lb 2.3 oz (105.3 kg)     Objective:    Vital Signs:   Today's Vitals   10/19/18 1112  Weight: 230 lb (104.3 kg)  Height: 5\' 9"  (1.753 m)   Body mass index is 33.97 kg/m.  p 83 bp 129/76  Normal affect. Normal speech pattern and tone. Comfortable, no apparent distress. No audible signs of SOB or wheezing.    ASSESSMENT & PLAN:    1. Chronic systolic HF/CAD - medical therapy limited by renal dysfunction, not on ACEI/ARB/aldactone/arni -he reports low bp's on higher hydral doses - no recent symptoms. May repeat echo in near future to see if LVEF has improved with medical therapy and BiV, at present not worth risk to COVID-19 exposure - continue current meds  2. CAD - no recent symptoms, continue current meds  3. HTN -at goal, continue current meds  4. Hyperlipidemia -  continue statin, labs [facility]    COVID-19 Education: The signs and symptoms of COVID-19 were discussed with the patient and how to seek care for testing (follow up with PCP or arrange E-visit).  The importance of social distancing was discussed today.  Time:   Today, I have spent 18 minutes with the patient with telehealth technology discussing the above problems.     Medication Adjustments/Labs and Tests Ordered: Current medicines are reviewed at length with the patient today.  Concerns regarding medicines are outlined above.   Tests Ordered: No orders of the defined types were placed in this encounter.   Medication Changes: No orders of the defined types were placed in this encounter.   Disposition:  Follow up 4 months  Signed, Carlyle Dolly, MD  10/19/2018 10:01 AM    [place]

## 2018-10-19 NOTE — Progress Notes (Signed)
EPIC Encounter for ICM Monitoring  Patient Name: Jonathan Floyd is a 73 y.o. male Date: 10/19/2018 Primary Care Physican: [facility], [place] Primary Cardiologist: [facility] Electrophysiologist: [place] Bi-V Pacing:   98%  10/19/2018 Weight: 230 lbs (baseline)  AT/AF Burden <1% Non-sustained VT episodes 6  PVC <1%, 70 K counts since Jun 28, 2018       1st ICM Remote.  Heart Failure questions reviewed.  Pt asymptomatic.   Corvue Thoracic impedance normal with exception of few days at decreased impedance suggestive of fluid accumulation in last month.   Prescribed: Torsemide 20 mg take 1 tablet by mouth daily as needed (for fluid). Taking Torsemide differently:  He is taking 1 tablet every 3-4 days.   Labs: 06/17/2018 Creatinine 1.60, BUN 13, Potassium 4.3, Sodium 137, GFR 43-50 06/16/2018 Creatinine 1.38, BUN 15, Potassium 3.7, Sodium 137, GFR 51-60  05/31/2018 Creatinine 1.54, BUN 15, Potassium 4.4, Sodium 142, GFR 45-52  A complete set of results can be found in Results Review.  Recommendations: No changes.  Reinforced limiting salt intake to < 2000 mg daily.  Encouraged to call if experiencing fluid symptoms.  Follow-up plan: ICM clinic phone appointment on 11/22/2018.   Office appt 10/19/2018 with Dr. Harl Bowie.    Copy of ICM check sent to Dr. Curt Bears and Dr Harl Bowie since he has telehealth visit with patient today.   3 month ICM trend: 10/18/2018    1 Year ICM trend:       Rosalene Billings, RN 10/19/2018 7:34 AM

## 2018-10-19 NOTE — Patient Instructions (Signed)
Medication Instructions:  Continue all current medications.  Labwork: none  Testing/Procedures: none  Follow-Up: 3 months   Any Other Special Instructions Will Be Listed Below (If Applicable).  If you need a refill on your cardiac medications before your next appointment, please call your pharmacy.  

## 2018-11-22 ENCOUNTER — Ambulatory Visit (INDEPENDENT_AMBULATORY_CARE_PROVIDER_SITE_OTHER): Payer: Medicare Other

## 2018-11-22 DIAGNOSIS — Z95810 Presence of automatic (implantable) cardiac defibrillator: Secondary | ICD-10-CM | POA: Diagnosis not present

## 2018-11-22 DIAGNOSIS — I5022 Chronic systolic (congestive) heart failure: Secondary | ICD-10-CM | POA: Diagnosis not present

## 2018-11-22 LAB — CUP PACEART REMOTE DEVICE CHECK
Date Time Interrogation Session: 20200706173341
Implantable Lead Implant Date: 20200129
Implantable Lead Implant Date: 20200129
Implantable Lead Implant Date: 20200129
Implantable Lead Location: 753858
Implantable Lead Location: 753859
Implantable Lead Location: 753860
Implantable Pulse Generator Implant Date: 20200129
Pulse Gen Serial Number: 9877212

## 2018-11-23 NOTE — Progress Notes (Signed)
EPIC Encounter for ICM Monitoring  Patient Name: Jonathan Floyd is a 73 y.o. male Date: 11/23/2018 Primary Care Physican: [facility] [place] Primary Care Physican: [facility], [place] Primary Cardiologist: [facility] Electrophysiologist: Cam[place] Bi-V Pacing:   98%          11/23/2018 Weight: 230 lbs (baseline)  AT/AF Burden <1% Non-sustained VT episodes 2  PVC <1%, 84 K counts since Jun 28, 2018                                                           Heart Failure questions reviewed.  Pt asymptomatic.  He takes a Torsemide when he feels congestion in his throat.    Corvue Thoracic impedance normal.   Prescribed: Torsemide 20 mg take 1 tablet by mouth daily as needed (for fluid). Taking Torsemide differently:  He is taking 1 tablet every 3-4 days.   Labs: 06/17/2018 Creatinine 1.60, BUN 13, Potassium 4.3, Sodium 137, GFR 43-50 06/16/2018 Creatinine 1.38, BUN 15, Potassium 3.7, Sodium 137, GFR 51-60  05/31/2018 Creatinine 1.54, BUN 15, Potassium 4.4, Sodium 142, GFR 45-52  A complete set of results can be found in Results Review.  Recommendations: No changes.  Reinforced limiting salt intake to < 2000 mg daily.  Encouraged to call if experiencing fluid symptoms.  Follow-up plan: ICM clinic phone appointment on 12/27/2018.    Copy of ICM check sent to Dr. Curt Bears.   3 month ICM trend: 11/22/2018    1 Year ICM trend:       Rosalene Billings, RN 11/23/2018 4:59 PM

## 2018-12-27 ENCOUNTER — Ambulatory Visit (INDEPENDENT_AMBULATORY_CARE_PROVIDER_SITE_OTHER): Payer: Medicare Other

## 2018-12-27 DIAGNOSIS — I5022 Chronic systolic (congestive) heart failure: Secondary | ICD-10-CM

## 2018-12-27 DIAGNOSIS — Z95810 Presence of automatic (implantable) cardiac defibrillator: Secondary | ICD-10-CM

## 2018-12-28 ENCOUNTER — Ambulatory Visit (INDEPENDENT_AMBULATORY_CARE_PROVIDER_SITE_OTHER): Payer: No Typology Code available for payment source | Admitting: *Deleted

## 2018-12-28 DIAGNOSIS — I429 Cardiomyopathy, unspecified: Secondary | ICD-10-CM | POA: Diagnosis not present

## 2018-12-28 LAB — CUP PACEART REMOTE DEVICE CHECK
Battery Remaining Longevity: 64 mo
Battery Remaining Percentage: 92 %
Battery Voltage: 3.13 V
Brady Statistic AP VP Percent: 5 %
Brady Statistic AP VS Percent: 1 %
Brady Statistic AS VP Percent: 94 %
Brady Statistic AS VS Percent: 1 %
Brady Statistic RA Percent Paced: 4.5 %
Date Time Interrogation Session: 20200810144938
HighPow Impedance: 60 Ohm
HighPow Impedance: 72 Ohm
Implantable Lead Implant Date: 20200129
Implantable Lead Implant Date: 20200129
Implantable Lead Implant Date: 20200129
Implantable Lead Location: 753858
Implantable Lead Location: 753859
Implantable Lead Location: 753860
Implantable Pulse Generator Implant Date: 20200129
Lead Channel Impedance Value: 1225 Ohm
Lead Channel Impedance Value: 430 Ohm
Lead Channel Impedance Value: 510 Ohm
Lead Channel Pacing Threshold Amplitude: 0.5 V
Lead Channel Pacing Threshold Amplitude: 0.625 V
Lead Channel Pacing Threshold Amplitude: 2.625 V
Lead Channel Pacing Threshold Pulse Width: 0.5 ms
Lead Channel Pacing Threshold Pulse Width: 0.5 ms
Lead Channel Pacing Threshold Pulse Width: 0.5 ms
Lead Channel Sensing Intrinsic Amplitude: 12 mV
Lead Channel Sensing Intrinsic Amplitude: 3.4 mV
Lead Channel Setting Pacing Amplitude: 2 V
Lead Channel Setting Pacing Amplitude: 3.5 V
Lead Channel Setting Pacing Amplitude: 3.625
Lead Channel Setting Pacing Pulse Width: 0.5 ms
Lead Channel Setting Pacing Pulse Width: 0.5 ms
Lead Channel Setting Sensing Sensitivity: 0.5 mV
Pulse Gen Serial Number: 9877212

## 2018-12-28 NOTE — Progress Notes (Signed)
EPIC Encounter for ICM Monitoring  Patient Name: Jonathan Floyd is a 73 y.o. male Date: 12/28/2018 Primary Care Physican: [facility] [place] Primary Cardiologist:[facility] Electrophysiologist:[place] Bi-V Pacing:99% 8/11/2020Weight: 230lbs - 232 lbs (baseline)    Heart Failure questions reviewed. Pt asymptomatic and said he is feeling good.  Advised he did not need to manually send transmissions unless he is having symptoms.  Explained when transmission is scheduled it comes automatically during the night.    CorvueThoracic impedance normal.   Prescribed: Torsemide20 mg take 1 tablet by mouth daily as needed (for fluid). Taking Torsemide differently: He is taking 1 tablet every 3-4 days.   Labs: 06/17/2018 Creatinine1.60Ella Bodo, Potassium4.3, [id number], [id number] 06/16/2018 Creatinine1.38, BUN15, Potassium3.7, [id number], [id number]  05/31/2018 Creatinine1.54, BUN15, Potassium4.4, [id number], [id number] A complete set of results can be found in Results Review.  Recommendations:No changes. Reinforced limiting salt intake to <2000 mg daily. Encouraged to call if experiencing fluid symptoms.  Follow-up plan: ICM clinic phone appointment on10/09/2018. OV with Dr Harl Bowie on 01/20/2019.  Copy of ICM check sent to [facility].    3 month ICM trend: 12/27/2018    1 Year ICM trend:       Rosalene Billings, RN 12/28/2018 2:58 PM

## 2019-01-05 ENCOUNTER — Encounter: Payer: Self-pay | Admitting: Cardiology

## 2019-01-05 NOTE — Progress Notes (Signed)
Remote ICD transmission.   

## 2019-01-16 DIAGNOSIS — E119 Type 2 diabetes mellitus without complications: Secondary | ICD-10-CM | POA: Diagnosis not present

## 2019-01-16 DIAGNOSIS — Z955 Presence of coronary angioplasty implant and graft: Secondary | ICD-10-CM | POA: Diagnosis not present

## 2019-01-16 DIAGNOSIS — Z7982 Long term (current) use of aspirin: Secondary | ICD-10-CM | POA: Diagnosis not present

## 2019-01-16 DIAGNOSIS — L03312 Cellulitis of back [any part except buttock]: Secondary | ICD-10-CM | POA: Diagnosis not present

## 2019-01-16 DIAGNOSIS — Z95810 Presence of automatic (implantable) cardiac defibrillator: Secondary | ICD-10-CM | POA: Diagnosis not present

## 2019-01-16 DIAGNOSIS — Z905 Acquired absence of kidney: Secondary | ICD-10-CM | POA: Diagnosis not present

## 2019-01-16 DIAGNOSIS — I509 Heart failure, unspecified: Secondary | ICD-10-CM | POA: Diagnosis not present

## 2019-01-16 DIAGNOSIS — Z79899 Other long term (current) drug therapy: Secondary | ICD-10-CM | POA: Diagnosis not present

## 2019-01-16 DIAGNOSIS — I110 Hypertensive heart disease with heart failure: Secondary | ICD-10-CM | POA: Diagnosis not present

## 2019-01-16 DIAGNOSIS — Z794 Long term (current) use of insulin: Secondary | ICD-10-CM | POA: Diagnosis not present

## 2019-01-16 DIAGNOSIS — Z87891 Personal history of nicotine dependence: Secondary | ICD-10-CM | POA: Diagnosis not present

## 2019-01-16 DIAGNOSIS — Z8673 Personal history of transient ischemic attack (TIA), and cerebral infarction without residual deficits: Secondary | ICD-10-CM | POA: Diagnosis not present

## 2019-01-20 ENCOUNTER — Encounter: Payer: Self-pay | Admitting: Cardiology

## 2019-01-20 ENCOUNTER — Other Ambulatory Visit: Payer: Self-pay

## 2019-01-20 ENCOUNTER — Telehealth: Payer: Self-pay | Admitting: Cardiology

## 2019-01-20 ENCOUNTER — Encounter: Payer: Self-pay | Admitting: *Deleted

## 2019-01-20 ENCOUNTER — Ambulatory Visit (INDEPENDENT_AMBULATORY_CARE_PROVIDER_SITE_OTHER): Payer: Medicare Other | Admitting: Cardiology

## 2019-01-20 VITALS — BP 122/73 | HR 83 | Ht 69.0 in | Wt 240.6 lb

## 2019-01-20 DIAGNOSIS — I2510 Atherosclerotic heart disease of native coronary artery without angina pectoris: Secondary | ICD-10-CM | POA: Diagnosis not present

## 2019-01-20 DIAGNOSIS — I10 Essential (primary) hypertension: Secondary | ICD-10-CM

## 2019-01-20 DIAGNOSIS — E782 Mixed hyperlipidemia: Secondary | ICD-10-CM | POA: Diagnosis not present

## 2019-01-20 DIAGNOSIS — N183 Chronic kidney disease, stage 3 unspecified: Secondary | ICD-10-CM

## 2019-01-20 DIAGNOSIS — I5022 Chronic systolic (congestive) heart failure: Secondary | ICD-10-CM

## 2019-01-20 NOTE — Telephone Encounter (Signed)
°  Precert needed for: Echo   Location: [facility]    Date: Oct 1,2020

## 2019-01-20 NOTE — Patient Instructions (Signed)
Your physician recommends that you schedule a follow-up appointment in: 4 MONTHS WITH DR BRANCH  Your physician recommends that you continue on your current medications as directed. Please refer to the Current Medication list given to you today.  Your physician has requested that you have an echocardiogram. Echocardiography is a painless test that uses sound waves to create images of your heart. It provides your doctor with information about the size and shape of your heart and how well your heart's chambers and valves are working. This procedure takes approximately one hour. There are no restrictions for this procedure.  Thank you for choosing [facility]!!    

## 2019-01-20 NOTE — Progress Notes (Signed)
Clinical Summary Jonathan Floyd is a 73 y.o.male  seen today for follow up of the following medical problems.    1. Chronic systolic HF - echo [id number] LVEF 0000000, grade I diastolic dysfunction - admit 11/2017 with fluid overload with medication and dietaery noncompliance. - approx 6-7 year history. They think LVEF 30-35% a few years ago.     -Jan 29/2020 had BiV AICD placed. Normal device check 12/2018 - no recent SOB/DOE. Denies any edema. Takes torsemide just prn, about once a week.  - compliant with meds.        2. History of CAD - history of prior cardiac stents done through [place]. Last in 2017.  - he has a stent card: Nov 2017 xience to prox LAD - EKG withchronicLBBB  -02/2018 nuclear stress large scar as reported below, no significant ischemia.  - denies any recent chest pain  3. HTN - he is compliant with meds    4. Hyperlipidemia -labs followed by [facility] - compliant with statin  5. CKD III - history of nephrectomy. Followed byDr Hinda Lenis nephrology - most recent labs with [facility]   Past Medical History:  Diagnosis Date  . AICD (automatic cardioverter/defibrillator) present 06/16/2018  . Cancer ([place])    43 years ago- kidney taken out for it  . Chronic systolic CHF (congestive heart failure) ([place])   . CKD (chronic kidney disease), stage III ([place])    1 kidney  . Coronary artery disease 2017   DES pLAD, [place]. 2 other stents in 2014  . Diabetes mellitus without complication ([place])   . Hypertension   . Renal insufficiency      Allergies  Allergen Reactions  . Iodine Other (See Comments)    Only has one kidney     Current Outpatient Medications  Medication Sig Dispense Refill  . acetaminophen (TYLENOL) 500 MG tablet Take 1,000 mg by mouth every 6 (six) hours as needed for mild pain or headache.     Marland Kitchen aspirin EC 81 MG tablet Take 81 mg by mouth daily.    Marland Kitchen atorvastatin (LIPITOR) 10 MG tablet Take 10 mg by mouth at  bedtime.     . carvedilol (COREG) 12.5 MG tablet Take 12.5 mg by mouth 2 (two) times daily with a meal.    . chlorhexidine (HIBICLENS) 4 % external liquid Apply 1 application topically See admin instructions. Apply the night before and the morning of the procedure    . ergocalciferol (VITAMIN D2) 50000 units capsule Take 50,000 Units by mouth 2 (two) times a week. Sunday and Wednesday    . finasteride (PROSCAR) 5 MG tablet Take 5 mg by mouth daily.    . hydrALAZINE (APRESOLINE) 50 MG tablet Take 25 mg by mouth 3 (three) times daily.    . insulin glargine (LANTUS) 100 UNIT/ML injection Inject 30 Units into the skin at bedtime.    . isosorbide dinitrate (ISORDIL) 10 MG tablet Take 20 mg by mouth 3 (three) times daily.    . nitroGLYCERIN (NITROSTAT) 0.4 MG SL tablet Place 0.4 mg under the tongue every 5 (five) minutes as needed for chest pain.    . potassium & sodium phosphates (PHOS-NAK) 280-160-250 MG PACK Take 1 packet by mouth 2 (two) times daily.    Marland Kitchen torsemide (DEMADEX) 20 MG tablet Take 20 mg by mouth daily as needed (for fluid).      No current facility-administered medications for this visit.      Past Surgical History:  Procedure Laterality Date  . BIV ICD INSERTION CRT-D N/A 06/16/2018   Procedure: BIV ICD INSERTION CRT-D;  Surgeon: Constance Haw, MD;  Location: [facility];  Service: Cardiovascular;  Laterality: N/A;  . CORONARY ANGIOPLASTY WITH STENT PLACEMENT    . ICD IMPLANT  03/17/2019   BIV  . KNEE ARTHROPLASTY    . NEPHRECTOMY  1976     Allergies  Allergen Reactions  . Iodine Other (See Comments)    Only has one kidney      Family History  Problem Relation Age of Onset  . Dementia Mother   . Diabetes Mother   . Heart attack Father   . Heart disease Father   . Hypertension Father      Social History Jonathan Floyd reports that he quit smoking about 31 years ago. He has never used smokeless tobacco. Jonathan Floyd reports current alcohol use.    Review of Systems CONSTITUTIONAL: No weight loss, fever, chills, weakness or fatigue.  HEENT: Eyes: No visual loss, blurred vision, double vision or yellow sclerae.No hearing loss, sneezing, congestion, runny nose or sore throat.  SKIN: No rash or itching.  CARDIOVASCULAR: per hpi RESPIRATORY: No shortness of breath, cough or sputum.  GASTROINTESTINAL: No anorexia, nausea, vomiting or diarrhea. No abdominal pain or blood.  GENITOURINARY: No burning on urination, no polyuria NEUROLOGICAL: No headache, dizziness, syncope, paralysis, ataxia, numbness or tingling in the extremities. No change in bowel or bladder control.  MUSCULOSKELETAL: No muscle, back pain, joint pain or stiffness.  LYMPHATICS: No enlarged nodes. No history of splenectomy.  PSYCHIATRIC: No history of depression or anxiety.  ENDOCRINOLOGIC: No reports of sweating, cold or heat intolerance. No polyuria or polydipsia.  Marland Kitchen   Physical Examination Today's Vitals   01/20/19 1301  BP: 122/73  Pulse: 83  SpO2: 95%  Weight: 240 lb 9.6 oz (109.1 kg)  Height: 5\' 9"  (1.753 m)   Body mass index is 35.53 kg/m.  Gen: resting comfortably, no acute distress HEENT: no scleral icterus, pupils equal round and reactive, no palptable cervical adenopathy,  CV: RRR, no m/r/g, no jvd Resp: Clear to auscultation bilaterally GI: abdomen is soft, non-tender, non-distended, normal bowel sounds, no hepatosplenomegaly MSK: extremities are warm, no edema.  Skin: warm, no rash Neuro:  no focal deficits Psych: appropriate affect   Diagnostic Studies  02/2018 nuclear stress  Defect 1: There is a large defect of moderate severity present in the basal inferoseptal, basal inferior, mid anteroseptal, mid inferoseptal, mid inferior, apical septal and apical inferior location.  Findings consistent with prior myocardial infarction. No significant ischemic zones.  This is a high risk study based on degree of scar and significantly depressed LVEF.   Nuclear stress EF: 31%.    11/2017 echo Study Conclusions  - Left ventricle: The cavity size was mildly dilated. Wall thickness was increased in a pattern of moderate LVH. Systolic function was severely reduced. The estimated ejection fraction was in the range of 20% to 25%. Diffuse hypokinesis. Doppler parameters are consistent with abnormal left ventricular relaxation (grade 1 diastolic dysfunction). Doppler parameters are consistent with high ventricular filling pressure. - Aortic valve: There was mild regurgitation. - Mitral valve: There was mild regurgitation. - Left atrium: The atrium was mildly dilated. - Pulmonary arteries: Systolic pressure was mildly increased. PA peak pressure: 39 mm Hg (S).  Impressions:  - Severe global reduction in LV systolic function; moderate LVH; mild LVE; mild diastolic dysfunction; mild AI and MR; mild LAE; trace TR with  mild pulmonary hypertension.  04/2018 echo Study Conclusions  - Left ventricle: The cavity size was normal. Wall thickness was increased in a pattern of mild LVH. Systolic function was severely reduced. The estimated ejection fraction was in the range of 20% to 25%. Diffuse hypokinesis. Doppler parameters are consistent with abnormal left ventricular relaxation (grade 1 diastolic dysfunction). Doppler parameters are consistent with indeterminate ventricular filling pressure. - Regional wall motion abnormality: Akinesis of the basal-mid anteroseptal myocardium. - Aortic valve: Trileaflet; mildly thickened, mildly calcified leaflets. There was mild stenosis (low output, low gradient). There was mild regurgitation. Valve area (VTI): 1.72 cm^2. Valve area (Vmax): 1.69 cm^2. Valve area (Vmean): 1.66 cm^2. - Mitral valve: Mildly calcified annulus. Mildly thickened leaflets . There was moderate regurgitation. - Left atrium: The atrium was moderately dilated. - Tricuspid valve:  There was mild-moderate regurgitation. - Pulmonary arteries: PA peak pressure: 36 mm Hg (S).   Assessment and Plan   1. Chronic systolic HF/CAD - medical therapy limited by renal dysfunction, not on ACEI/ARB/aldactone/arni -he reports low bp's on higher hydral doses - doing well, no med changes today - several months since his BIV/AICD was placed, recheck echo to see if improved LV function  2. CAD - no symptoms, continue current meds  3. HTN - he is at goal, continue current meds  4. Hyperlipidemia - continue statin, request labs from pcp  5. CKD III - avoid nephrotoxic meds, request labs from pcp  F/u 4 months   Arnoldo Lenis, M.D.

## 2019-02-17 ENCOUNTER — Other Ambulatory Visit: Payer: Self-pay

## 2019-02-17 ENCOUNTER — Ambulatory Visit (INDEPENDENT_AMBULATORY_CARE_PROVIDER_SITE_OTHER): Payer: No Typology Code available for payment source

## 2019-02-17 DIAGNOSIS — I5022 Chronic systolic (congestive) heart failure: Secondary | ICD-10-CM | POA: Diagnosis not present

## 2019-02-21 ENCOUNTER — Ambulatory Visit (INDEPENDENT_AMBULATORY_CARE_PROVIDER_SITE_OTHER): Payer: No Typology Code available for payment source

## 2019-02-21 DIAGNOSIS — Z95810 Presence of automatic (implantable) cardiac defibrillator: Secondary | ICD-10-CM

## 2019-02-21 DIAGNOSIS — I5022 Chronic systolic (congestive) heart failure: Secondary | ICD-10-CM | POA: Diagnosis not present

## 2019-02-22 LAB — CUP PACEART REMOTE DEVICE CHECK
Battery Remaining Longevity: 67 mo
Battery Remaining Percentage: 89 %
Battery Voltage: 3.1 V
Brady Statistic AP VP Percent: 5.2 %
Brady Statistic AP VS Percent: 1 %
Brady Statistic AS VP Percent: 94 %
Brady Statistic AS VS Percent: 1 %
Brady Statistic RA Percent Paced: 4.7 %
Date Time Interrogation Session: 20201005080016
HighPow Impedance: 73 Ohm
HighPow Impedance: 73 Ohm
Implantable Lead Implant Date: 20200129
Implantable Lead Implant Date: 20200129
Implantable Lead Implant Date: 20200129
Implantable Lead Location: 753858
Implantable Lead Location: 753859
Implantable Lead Location: 753860
Implantable Pulse Generator Implant Date: 20200129
Lead Channel Impedance Value: 1200 Ohm
Lead Channel Impedance Value: 430 Ohm
Lead Channel Impedance Value: 510 Ohm
Lead Channel Pacing Threshold Amplitude: 0.5 V
Lead Channel Pacing Threshold Amplitude: 0.625 V
Lead Channel Pacing Threshold Amplitude: 2.5 V
Lead Channel Pacing Threshold Pulse Width: 0.5 ms
Lead Channel Pacing Threshold Pulse Width: 0.5 ms
Lead Channel Pacing Threshold Pulse Width: 0.5 ms
Lead Channel Sensing Intrinsic Amplitude: 12 mV
Lead Channel Sensing Intrinsic Amplitude: 5 mV
Lead Channel Setting Pacing Amplitude: 2 V
Lead Channel Setting Pacing Amplitude: 3.5 V
Lead Channel Setting Pacing Amplitude: 3.5 V
Lead Channel Setting Pacing Pulse Width: 0.5 ms
Lead Channel Setting Pacing Pulse Width: 0.5 ms
Lead Channel Setting Sensing Sensitivity: 0.5 mV
Pulse Gen Serial Number: 9877212

## 2019-02-23 ENCOUNTER — Telehealth: Payer: Self-pay | Admitting: *Deleted

## 2019-02-23 ENCOUNTER — Telehealth: Payer: Self-pay

## 2019-02-23 NOTE — Progress Notes (Signed)
EPIC Encounter for ICM Monitoring  Patient Name: Jonathan Floyd is a 73 y.o. male Date: 02/23/2019 Primary Care Physican: [facility] [place] Primary Cardiologist:Branch Electrophysiologist:[place] Bi-V Pacing:99% 8/11/2020Weight: 230lbs - 232 lbs (baseline)   Attempted call to patient and unable to reach.   Transmission reviewed.   CorvueThoracic impedance normal.   Prescribed: Torsemide20 mg take 1 tablet by mouth daily as needed (for fluid). Taking Torsemide differently: He is taking 1 tablet every 3-4 days.   Labs: 06/17/2018 Creatinine1.60Ella Bodo, Potassium4.3, [id number], [id number] 06/16/2018 Creatinine1.38, BUN15, Potassium3.7, [id number], [id number]  05/31/2018 Creatinine1.54, BUN15, Potassium4.4, [id number], [id number] A complete set of results can be found in Results Review.  Recommendations: Unable to reach.    Follow-up plan: ICM clinic phone appointment on 03/30/2019.   91 day device clinic remote transmission 03/29/2019.    Copy of ICM check sent to Dr. Curt Bears.   3 month ICM trend: 02/21/2019    1 Year ICM trend:       Rosalene Billings, RN 02/23/2019 2:24 PM

## 2019-02-23 NOTE — Telephone Encounter (Signed)
Patient informed. Copy sent to PCP °

## 2019-02-23 NOTE — Telephone Encounter (Signed)
LM to return call.

## 2019-02-23 NOTE — Telephone Encounter (Signed)
Remote ICM transmission received.  Attempted call to patient regarding ICM remote transmission and voice mail box is not set up.    

## 2019-02-23 NOTE — Telephone Encounter (Signed)
-----   Message from Arnoldo Lenis, MD sent at 02/21/2019 10:43 AM EDT ----- Heart pumping function much impoved, and essentially the pumping strength has gotten back to normal  Zandra Abts MD

## 2019-03-13 IMAGING — US US RENAL
1 series · 14 of 25 positions shown · non-contrast
Comparison: None.

CLINICAL DATA: Chronic kidney disease stage 3.

EXAM:
RENAL / URINARY TRACT ULTRASOUND COMPLETE

[Series 1: us renal · 0.28mm/px · 14 of 35 slices shown]
[im 1/35]
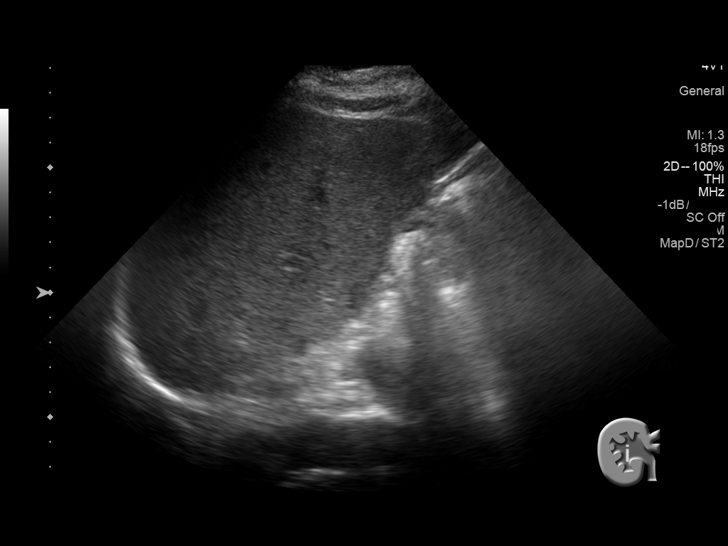
[im 3/35]
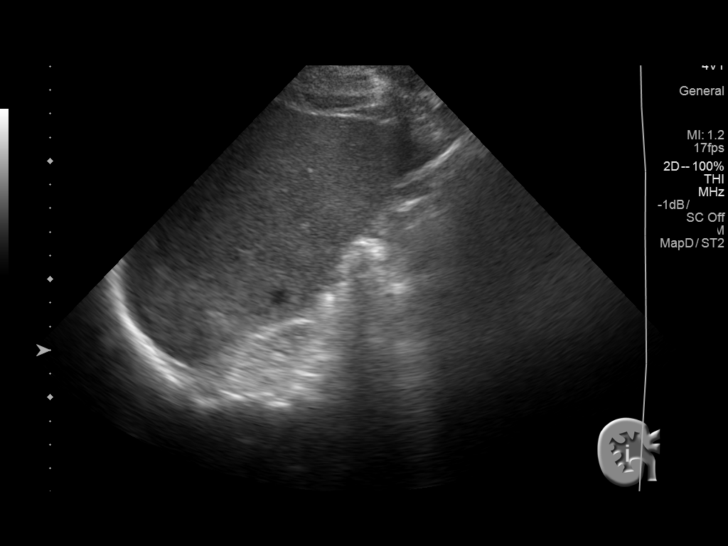
[im 6/35]
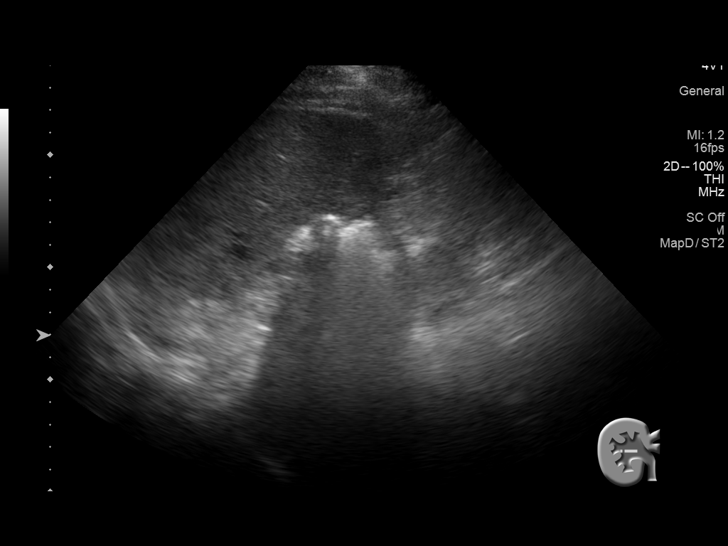
[im 9/35]
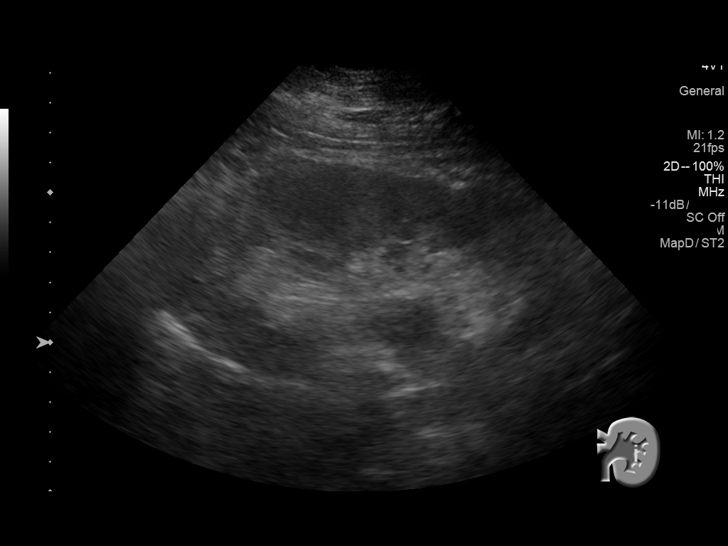
[im 12/35]
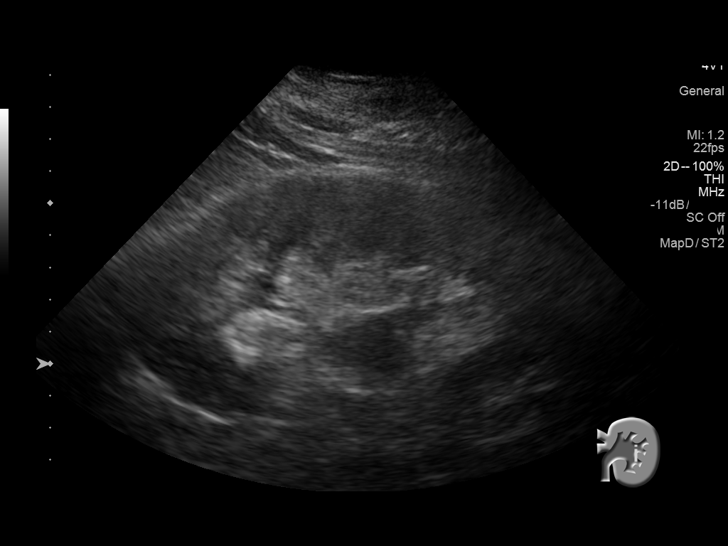
[im 13/35]
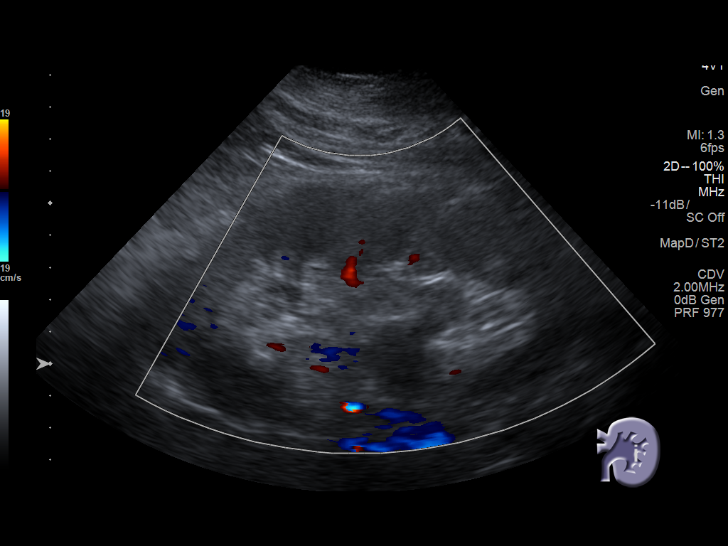
[im 16/35]
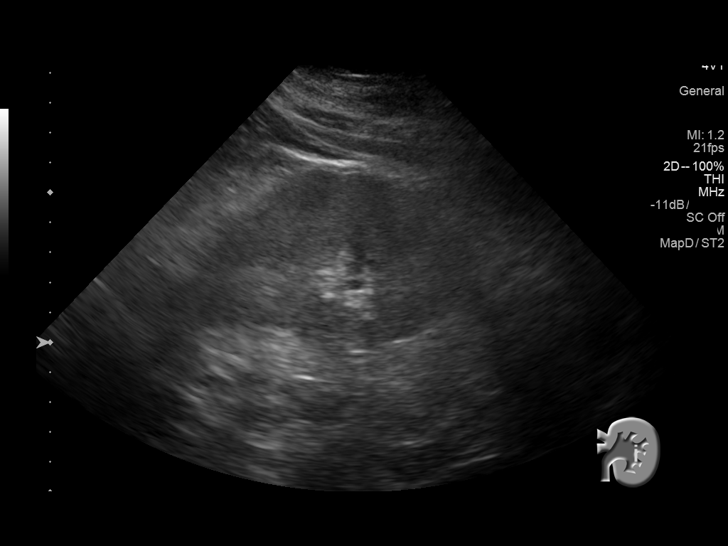
[im 19/35]
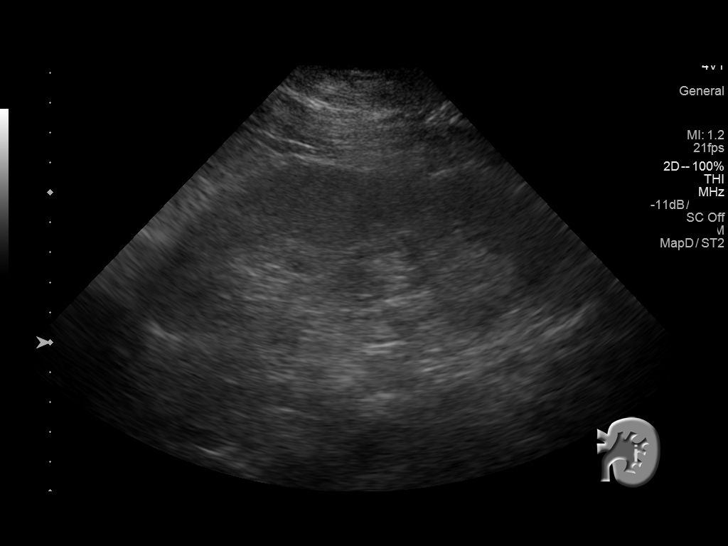
[im 22/35]
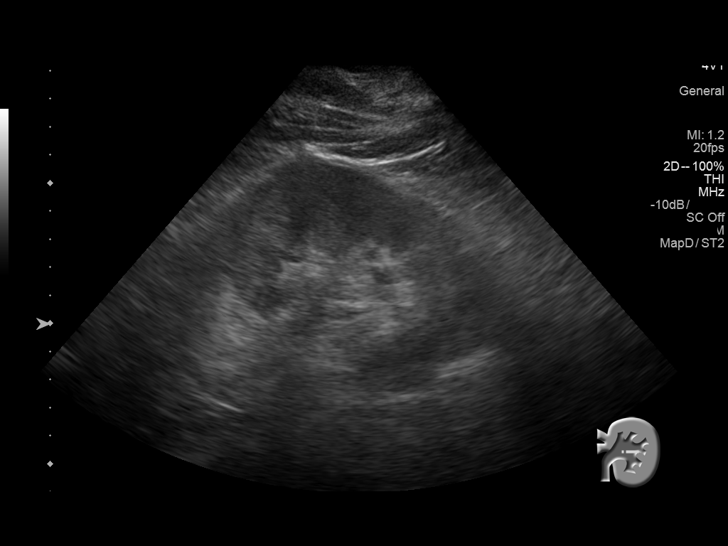
[im 23/35]
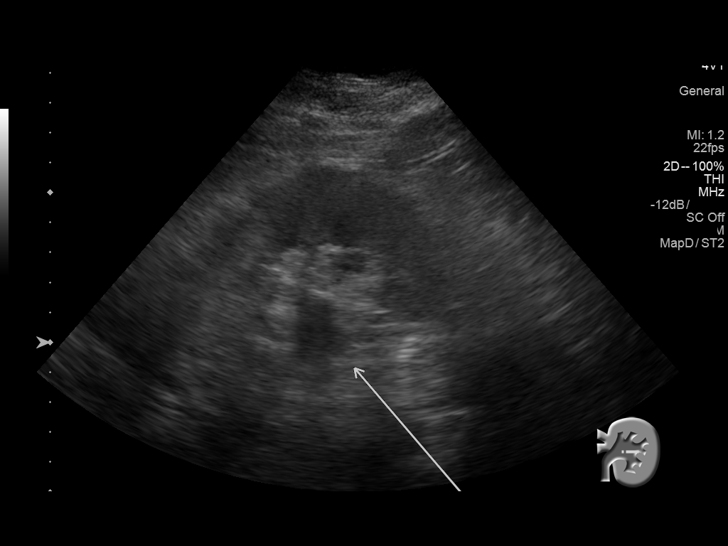
[im 26/35]
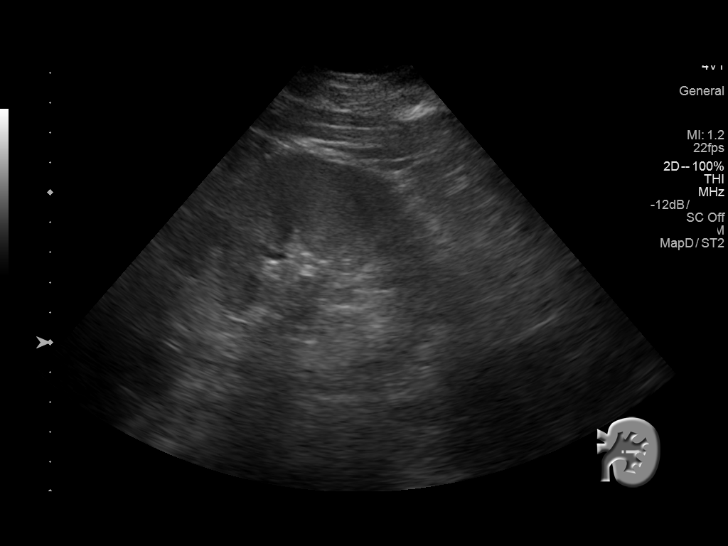
[im 29/35]
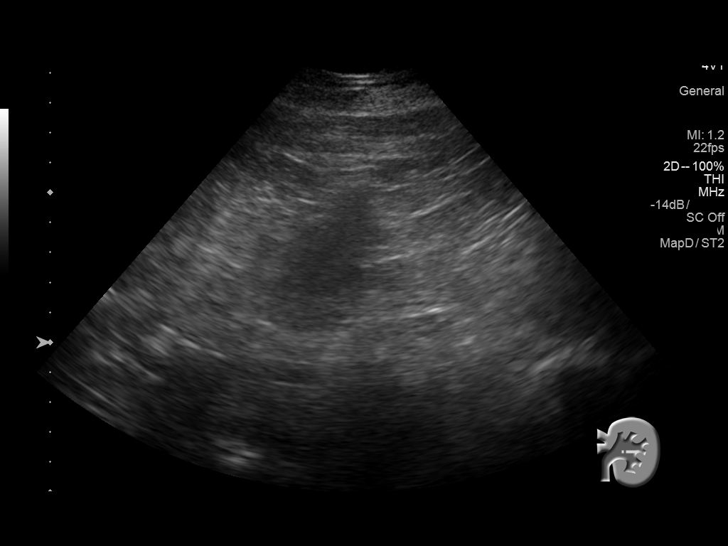
[im 32/35]
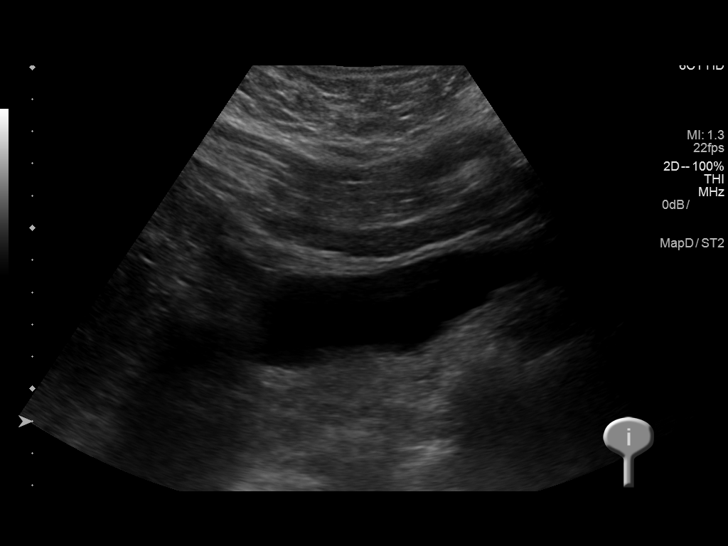
[im 35/35]
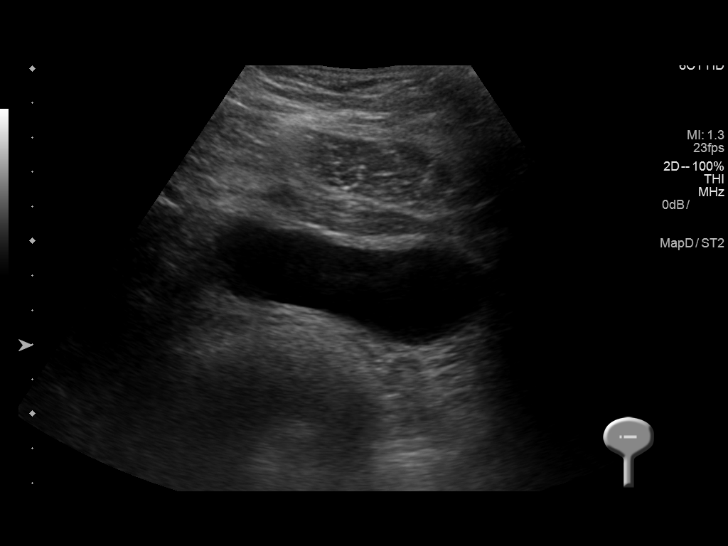

[14 of 25 positions shown; findings below may reference images not displayed]

FINDINGS: Right Kidney:

Surgically absent.

Left Kidney:

Length: 14.5 cm. Echogenicity within normal limits. No mass
identified. Mild pelviectasis and at most slight caliectasis..

Bladder:

Appears normal for degree of bladder distention.
IMPRESSION: 1. Mild left renal pelviectasis/slight hydronephrosis.
2. Status post right nephrectomy.

## 2019-03-29 ENCOUNTER — Ambulatory Visit (INDEPENDENT_AMBULATORY_CARE_PROVIDER_SITE_OTHER): Payer: No Typology Code available for payment source | Admitting: *Deleted

## 2019-03-29 DIAGNOSIS — I5022 Chronic systolic (congestive) heart failure: Secondary | ICD-10-CM | POA: Diagnosis not present

## 2019-03-29 DIAGNOSIS — I429 Cardiomyopathy, unspecified: Secondary | ICD-10-CM

## 2019-03-29 LAB — CUP PACEART REMOTE DEVICE CHECK
Battery Remaining Longevity: 65 mo
Battery Remaining Percentage: 88 %
Battery Voltage: 3.07 V
Brady Statistic AP VP Percent: 4.7 %
Brady Statistic AP VS Percent: 1 %
Brady Statistic AS VP Percent: 94 %
Brady Statistic AS VS Percent: 1 %
Brady Statistic RA Percent Paced: 4.3 %
Date Time Interrogation Session: 20201110195549
HighPow Impedance: 81 Ohm
HighPow Impedance: 81 Ohm
Implantable Lead Implant Date: 20200129
Implantable Lead Implant Date: 20200129
Implantable Lead Implant Date: 20200129
Implantable Lead Location: 753858
Implantable Lead Location: 753859
Implantable Lead Location: 753860
Implantable Pulse Generator Implant Date: 20200129
Lead Channel Impedance Value: 1200 Ohm
Lead Channel Impedance Value: 410 Ohm
Lead Channel Impedance Value: 490 Ohm
Lead Channel Pacing Threshold Amplitude: 0.5 V
Lead Channel Pacing Threshold Amplitude: 0.625 V
Lead Channel Pacing Threshold Amplitude: 2.625 V
Lead Channel Pacing Threshold Pulse Width: 0.5 ms
Lead Channel Pacing Threshold Pulse Width: 0.5 ms
Lead Channel Pacing Threshold Pulse Width: 0.5 ms
Lead Channel Sensing Intrinsic Amplitude: 12 mV
Lead Channel Sensing Intrinsic Amplitude: 5 mV
Lead Channel Setting Pacing Amplitude: 2 V
Lead Channel Setting Pacing Amplitude: 3.5 V
Lead Channel Setting Pacing Amplitude: 3.625
Lead Channel Setting Pacing Pulse Width: 0.5 ms
Lead Channel Setting Pacing Pulse Width: 0.5 ms
Lead Channel Setting Sensing Sensitivity: 0.5 mV
Pulse Gen Serial Number: 9877212

## 2019-03-30 ENCOUNTER — Ambulatory Visit (INDEPENDENT_AMBULATORY_CARE_PROVIDER_SITE_OTHER): Payer: No Typology Code available for payment source

## 2019-03-30 DIAGNOSIS — I5022 Chronic systolic (congestive) heart failure: Secondary | ICD-10-CM

## 2019-03-30 DIAGNOSIS — Z95810 Presence of automatic (implantable) cardiac defibrillator: Secondary | ICD-10-CM | POA: Diagnosis not present

## 2019-04-01 ENCOUNTER — Telehealth: Payer: Self-pay

## 2019-04-01 NOTE — Progress Notes (Signed)
EPIC Encounter for ICM Monitoring  Patient Name: Jonathan Floyd is a 73 y.o. male Date: 04/01/2019 Primary Care Physican: [facility] [place] Primary Cardiologist:Branch Electrophysiologist:[place] Bi-V Pacing:99% 8/11/2020Weight: 230lbs- 232 lbs(baseline)   Attempted call to patient and unable to reach.   Transmission reviewed.   CorvueThoracic impedance normal.   Prescribed: Torsemide20 mg take 1 tablet by mouth daily as needed (for fluid). Taking Torsemide differently: He is taking 1 tablet every 3-4 days.   Labs: 01/17/2019 Creatinine 1.47, BUN 21, Potassium 4.0, Sodium 143, GFR 57 06/17/2018 Creatinine1.60, BUN13, Potassium4.3, [id number], GFR43-50 06/16/2018 Creatinine1.38, BUN15, Potassium3.7, [id number], GFR51-60  05/31/2018 Creatinine1.54, BUN15, Potassium4.4, Sodium142, [id number] A complete set of results can be found in Results Review.  Recommendations: Unable to reach.    Follow-up plan: ICM clinic phone appointment on 05/02/2019.   91 day device clinic remote transmission 06/28/2019.   Copy of ICM check sent to Dr. Curt Bears.   3 month ICM trend: 03/29/2019    1 Year ICM trend:       Rosalene Billings, RN 04/01/2019 10:22 AM

## 2019-04-01 NOTE — Telephone Encounter (Signed)
Remote ICM transmission received.  Attempted call to patient regarding ICM remote transmission and voice mail box has not been set up. °

## 2019-04-19 NOTE — Progress Notes (Signed)
Remote ICD transmission.   

## 2019-05-02 ENCOUNTER — Ambulatory Visit (INDEPENDENT_AMBULATORY_CARE_PROVIDER_SITE_OTHER): Payer: No Typology Code available for payment source

## 2019-05-02 DIAGNOSIS — I5022 Chronic systolic (congestive) heart failure: Secondary | ICD-10-CM

## 2019-05-02 DIAGNOSIS — Z95810 Presence of automatic (implantable) cardiac defibrillator: Secondary | ICD-10-CM | POA: Diagnosis not present

## 2019-05-02 LAB — CUP PACEART REMOTE DEVICE CHECK
Battery Remaining Longevity: 64 mo
Battery Remaining Percentage: 87 %
Battery Voltage: 3.05 V
Brady Statistic AP VP Percent: 5 %
Brady Statistic AP VS Percent: 1 %
Brady Statistic AS VP Percent: 94 %
Brady Statistic AS VS Percent: 1 %
Brady Statistic RA Percent Paced: 4.5 %
Date Time Interrogation Session: 20201214040015
HighPow Impedance: 70 Ohm
HighPow Impedance: 70 Ohm
Implantable Lead Implant Date: 20200129
Implantable Lead Implant Date: 20200129
Implantable Lead Implant Date: 20200129
Implantable Lead Location: 753858
Implantable Lead Location: 753859
Implantable Lead Location: 753860
Implantable Pulse Generator Implant Date: 20200129
Lead Channel Impedance Value: 1175 Ohm
Lead Channel Impedance Value: 410 Ohm
Lead Channel Impedance Value: 490 Ohm
Lead Channel Pacing Threshold Amplitude: 0.5 V
Lead Channel Pacing Threshold Amplitude: 0.625 V
Lead Channel Pacing Threshold Amplitude: 3.5 V
Lead Channel Pacing Threshold Pulse Width: 0.5 ms
Lead Channel Pacing Threshold Pulse Width: 0.5 ms
Lead Channel Pacing Threshold Pulse Width: 0.5 ms
Lead Channel Sensing Intrinsic Amplitude: 12 mV
Lead Channel Sensing Intrinsic Amplitude: 4.7 mV
Lead Channel Setting Pacing Amplitude: 2 V
Lead Channel Setting Pacing Amplitude: 3.5 V
Lead Channel Setting Pacing Amplitude: 4.5 V
Lead Channel Setting Pacing Pulse Width: 0.5 ms
Lead Channel Setting Pacing Pulse Width: 0.5 ms
Lead Channel Setting Sensing Sensitivity: 0.5 mV
Pulse Gen Serial Number: 9877212

## 2019-05-04 ENCOUNTER — Telehealth: Payer: Self-pay

## 2019-05-04 NOTE — Telephone Encounter (Signed)
Remote ICM transmission received.  Attempted call to patient regarding ICM remote transmission and mail box is full. 

## 2019-05-04 NOTE — Progress Notes (Signed)
EPIC Encounter for ICM Monitoring  Patient Name: Jonathan Floyd is a 73 y.o. male Date: 05/04/2019 Primary Care Physican: [facility] [place] Primary Cardiologist:Branch Electrophysiologist:[place] Bi-V Pacing:99% 8/11/2020Weight: 230lbs- 232 lbs(baseline)   Attempted call to patient and unable to reach. Transmission reviewed.   CorvueThoracic impedance normal.   Prescribed: Torsemide20 mg take 1 tablet by mouth daily as needed (for fluid).    Labs: 01/17/2019 Creatinine 1.47, BUN 21, Potassium 4.0, Sodium 143, GFR 57 06/17/2018 Creatinine1.60, BUN13, Potassium4.3, [id number], GFR43-50 06/16/2018 Creatinine1.38, BUN15, Potassium3.7, [id number], GFR51-60  05/31/2018 Creatinine1.54, BUN15, Potassium4.4, Sodium142, [id number] A complete set of results can be found in Results Review.  Recommendations: Unable to reach.    Follow-up plan: ICM clinic phone appointment on 06/06/2019.   91 day device clinic remote transmission 06/28/2019.   Copy of ICM check sent to Dr. Curt Bears.   3 month ICM trend: 05/02/2019    1 Year ICM trend:       Rosalene Billings, RN 05/04/2019 9:42 AM

## 2019-06-06 ENCOUNTER — Ambulatory Visit (INDEPENDENT_AMBULATORY_CARE_PROVIDER_SITE_OTHER): Payer: No Typology Code available for payment source

## 2019-06-06 DIAGNOSIS — Z95810 Presence of automatic (implantable) cardiac defibrillator: Secondary | ICD-10-CM | POA: Diagnosis not present

## 2019-06-06 DIAGNOSIS — I5022 Chronic systolic (congestive) heart failure: Secondary | ICD-10-CM

## 2019-06-08 NOTE — Progress Notes (Signed)
EPIC Encounter for ICM Monitoring  Patient Name: Jonathan Floyd is a 74 y.o. male Date: 06/08/2019 Primary Care Physican: [facility] [place] Primary Cardiologist:Branch Electrophysiologist:[place] Bi-V Pacing:99% 8/11/2020Weight: 230lbs- 232 lbs(baseline)   Transmission reviewed and results sent via mychart.   CorvueThoracic impedance normal but suggesting possible fluid accumulation from 05/17/19 - 05/25/19 and 05/31/19 - 06/05/19.  Prescribed: Torsemide20 mg take 1 tablet by mouth daily as needed (for fluid).    Labs: 01/17/2019 Creatinine 1.47, BUN 21, Potassium 4.0, Sodium 143, GFR 57 06/17/2018 Creatinine1.60, BUN13, Potassium4.3, [id number], GFR43-50 06/16/2018 Creatinine1.38, BUN15, Potassium3.7, [id number], GFR51-60  05/31/2018 Creatinine1.54, BUN15, Potassium4.4, Sodium142, [id number] A complete set of results can be found in Results Review.  Recommendations: Advised to call if experiencing any fluid symptoms.  Follow-up plan: ICM clinic phone appointment on 07/11/2019. 91 day device clinic remote transmission 06/28/2019.   Copy of ICM check sent to [facility].    3 month ICM trend: 06/06/2019    1 Year ICM trend:       Rosalene Billings, RN 06/08/2019 4:18 PM

## 2019-06-28 ENCOUNTER — Ambulatory Visit (INDEPENDENT_AMBULATORY_CARE_PROVIDER_SITE_OTHER): Payer: No Typology Code available for payment source | Admitting: *Deleted

## 2019-06-28 DIAGNOSIS — I5022 Chronic systolic (congestive) heart failure: Secondary | ICD-10-CM | POA: Diagnosis not present

## 2019-06-28 LAB — CUP PACEART REMOTE DEVICE CHECK
Battery Remaining Longevity: 62 mo
Battery Remaining Percentage: 85 %
Battery Voltage: 3.02 V
Brady Statistic AP VP Percent: 5.3 %
Brady Statistic AP VS Percent: 1 %
Brady Statistic AS VP Percent: 94 %
Brady Statistic AS VS Percent: 1 %
Brady Statistic RA Percent Paced: 4.9 %
Date Time Interrogation Session: 20210209020019
HighPow Impedance: 78 Ohm
HighPow Impedance: 78 Ohm
Implantable Lead Implant Date: 20200129
Implantable Lead Implant Date: 20200129
Implantable Lead Implant Date: 20200129
Implantable Lead Location: 753858
Implantable Lead Location: 753859
Implantable Lead Location: 753860
Implantable Pulse Generator Implant Date: 20200129
Lead Channel Impedance Value: 1275 Ohm
Lead Channel Impedance Value: 430 Ohm
Lead Channel Impedance Value: 480 Ohm
Lead Channel Pacing Threshold Amplitude: 0.5 V
Lead Channel Pacing Threshold Amplitude: 0.625 V
Lead Channel Pacing Threshold Amplitude: 3 V
Lead Channel Pacing Threshold Pulse Width: 0.5 ms
Lead Channel Pacing Threshold Pulse Width: 0.5 ms
Lead Channel Pacing Threshold Pulse Width: 0.5 ms
Lead Channel Sensing Intrinsic Amplitude: 12 mV
Lead Channel Sensing Intrinsic Amplitude: 4.3 mV
Lead Channel Setting Pacing Amplitude: 2 V
Lead Channel Setting Pacing Amplitude: 3.5 V
Lead Channel Setting Pacing Amplitude: 4 V
Lead Channel Setting Pacing Pulse Width: 0.5 ms
Lead Channel Setting Pacing Pulse Width: 0.5 ms
Lead Channel Setting Sensing Sensitivity: 0.5 mV
Pulse Gen Serial Number: 9877212

## 2019-06-29 NOTE — Progress Notes (Signed)
ICD Remote  

## 2019-07-08 ENCOUNTER — Telehealth: Payer: Self-pay | Admitting: Internal Medicine

## 2019-07-08 NOTE — Telephone Encounter (Signed)
Patient fell at work and slide on his chest. He now has a large mass on his chest around defib site. Wife is very concerned due to it is bruised black and purple. States that it does hurt when he touches it

## 2019-07-08 NOTE — Telephone Encounter (Signed)
See phone note from 07/08/19.

## 2019-07-08 NOTE — Telephone Encounter (Signed)
Spoke with patient and wife. Pt reports he fell around 11am yesterday, slipped on the ice on his back steps. Right chest started swelling and changing color yesterday, ~1min after fall. Last dose of ASA yesterday. Assisted pt's wife with sending photos via MyChart for review. Confirmed that ICD is implanted at left chest. Pt reports ICD site/left chest is not swollen, tender, or discolored. Pt's wife is also contacting PCP for recommendations. They are concerned about possible damage to ICD/leads due to the fall.  Photos received. Ecchymosis and swelling noted at right breast/nipple area. Assisted pt and wife with sending a manual transmission for review. Advised will call back after transmission received. Pt and wife in agreement with plan.

## 2019-07-08 NOTE — Telephone Encounter (Signed)
Transmission received and reviewed. Normal CRT-D function. No alerts or VT/VF episodes. <1% AT/AF burden, EGMs show brief AT, all <24min. Lead trends overall stable, LV auto threshold gradually trending up over time but no acute changes.  Spoke with patient's wife. She reports they are at PCP office now. Advised of normal ICD function. She verbalizes appreciation of call and reassurance. Advised if pt has any concerns about ICD site going forward, such as swelling, discoloration, pain, or drainage, to call us back. Pt's wife verbalizes understanding and agreement with plan.

## 2019-07-11 ENCOUNTER — Ambulatory Visit (INDEPENDENT_AMBULATORY_CARE_PROVIDER_SITE_OTHER): Payer: No Typology Code available for payment source

## 2019-07-11 DIAGNOSIS — Z95810 Presence of automatic (implantable) cardiac defibrillator: Secondary | ICD-10-CM | POA: Diagnosis not present

## 2019-07-11 DIAGNOSIS — I5022 Chronic systolic (congestive) heart failure: Secondary | ICD-10-CM | POA: Diagnosis not present

## 2019-07-15 ENCOUNTER — Telehealth: Payer: Self-pay

## 2019-07-15 NOTE — Telephone Encounter (Signed)
Remote ICM transmission received.  Attempted call to patient regarding ICM remote transmission and no answer.  

## 2019-07-15 NOTE — Progress Notes (Addendum)
EPIC Encounter for ICM Monitoring  Patient Name: Jonathan Floyd is a 74 y.o. male Date: 07/15/2019 Primary Care Physican: [facility] [place] Primary Cardiologist:Branch Electrophysiologist:[place] Bi-V Pacing:99% 8/11/2020Weight: 230lbs- 232 lbs(baseline)   AT/AF Burden <1%  Attempted call to patient and unable to reach.  Transmission reviewed.   CorvueThoracic impedance normal.  Prescribed: Torsemide20 mg take 1 tablet by mouth daily as needed (for fluid).   Labs: 01/17/2019 Creatinine 1.47, BUN 21, Potassium 4.0, Sodium 143, GFR 57 06/17/2018 Creatinine1.60, BUN13, Potassium4.3, [id number], GFR43-50 06/16/2018 Creatinine1.38, BUN15, Potassium3.7, [id number], GFR51-60  05/31/2018 Creatinine1.54, BUN15, Potassium4.4, Sodium142, [id number] A complete set of results can be found in Results Review.  Recommendations: Unable to reach.    Follow-up plan: ICM clinic phone appointment on 08/15/2019. 91 day device clinic remote transmission 09/27/2019.  Office visits with Dr Harl Bowie on 07/25/2019 and 09/23/2019 with Dr Rayann Heman  Copy of ICM check sent to Dr.Allred.  Direct trend through 07/11/2019   3 month ICM trend: 07/08/2019     1 Year ICM trend:       Rosalene Billings, RN 07/15/2019 12:54 PM

## 2019-07-25 ENCOUNTER — Encounter: Payer: Self-pay | Admitting: Cardiology

## 2019-07-25 ENCOUNTER — Ambulatory Visit (INDEPENDENT_AMBULATORY_CARE_PROVIDER_SITE_OTHER): Payer: No Typology Code available for payment source | Admitting: Cardiology

## 2019-07-25 ENCOUNTER — Encounter: Payer: Self-pay | Admitting: *Deleted

## 2019-07-25 ENCOUNTER — Other Ambulatory Visit: Payer: Self-pay

## 2019-07-25 VITALS — BP 168/92 | HR 77 | Ht 69.0 in | Wt 236.2 lb

## 2019-07-25 DIAGNOSIS — I10 Essential (primary) hypertension: Secondary | ICD-10-CM

## 2019-07-25 DIAGNOSIS — I2510 Atherosclerotic heart disease of native coronary artery without angina pectoris: Secondary | ICD-10-CM | POA: Diagnosis not present

## 2019-07-25 DIAGNOSIS — I5022 Chronic systolic (congestive) heart failure: Secondary | ICD-10-CM

## 2019-07-25 MED ORDER — HYDRALAZINE HCL 50 MG PO TABS: 50.0000 mg | ORAL_TABLET | Freq: Three times a day (TID) | ORAL | 1 refills | Status: DC

## 2019-07-25 NOTE — Progress Notes (Signed)
Clinical Summary Jonathan Floyd is a 74 y.o.male seen today for follow up of the following medical problems.   1. Chronic systolic HF - echo [id number] LVEF 0000000, grade I diastolic dysfunction -Jan 29/2020 had BiV AICD placed.   02/2019 echo: LVEF 50-55%, grade I DDX,  - occasonal SOB, he is working to limit toresmide due to renal dysfunction.  - no LE edema - compliant with meds  2. History of CAD - history of prior cardiac stents done through [place]. Last in 2017.  - he has a stent card: Nov 2017 xience to prox LAD - EKG withchronicLBBB  -02/2018 nuclear stress large scar as reported below, no significant ischemia.  - denies any recent chest pain  3. HTN - he is compliant with meds  - for some reason his hydral was chagned 25mg  tid through pharmacy he reports   4. Hyperlipidemia -labs followed by [facility] -he is compliant with statin  5. CKD III - history of nephrectomy. Followed by nephrology - most recent labs with [facility]   Past Medical History:  Diagnosis Date  . AICD (automatic cardioverter/defibrillator) present 06/16/2018  . Cancer ([place])    43 years ago- kidney taken out for it  . Chronic systolic CHF (congestive heart failure) ([place])   . CKD (chronic kidney disease), stage III ([place])    1 kidney  . Coronary artery disease 2017   DES pLAD, [place]. 2 other stents in 2014  . Diabetes mellitus without complication ([place])   . Hypertension   . Renal insufficiency      Allergies  Allergen Reactions  . Iodinated Diagnostic Agents Other (See Comments)    Kidney problems  . Iodine Other (See Comments)    Only has one kidney     Current Outpatient Medications  Medication Sig Dispense Refill  . acetaminophen (TYLENOL) 500 MG tablet Take 1,000 mg by mouth every 6 (six) hours as needed for mild pain or headache.     Marland Kitchen aspirin EC 81 MG tablet Take 81 mg by mouth daily.    Marland Kitchen atorvastatin (LIPITOR) 10 MG tablet Take 10 mg by  mouth at bedtime.     . carvedilol (COREG) 12.5 MG tablet Take 12.5 mg by mouth 2 (two) times daily with a meal.    . ergocalciferol (VITAMIN D2) 50000 units capsule Take 50,000 Units by mouth 2 (two) times a week. Sunday and Wednesday    . finasteride (PROSCAR) 5 MG tablet Take 5 mg by mouth daily.    . hydrALAZINE (APRESOLINE) 50 MG tablet Take 25 mg by mouth 3 (three) times daily.    Marland Kitchen HYDROcodone-acetaminophen (NORCO/VICODIN) 5-325 MG tablet Take 1 tablet by mouth every 6 (six) hours as needed.    . insulin glargine (LANTUS) 100 UNIT/ML injection Inject 30 Units into the skin at bedtime.    . isosorbide dinitrate (ISORDIL) 10 MG tablet Take 30 mg by mouth 3 (three) times daily.     . nitroGLYCERIN (NITROSTAT) 0.4 MG SL tablet Place 0.4 mg under the tongue every 5 (five) minutes as needed for chest pain.    . potassium & sodium phosphates (PHOS-NAK) 280-160-250 MG PACK Take 1 packet by mouth 2 (two) times daily.    Marland Kitchen torsemide (DEMADEX) 20 MG tablet Take 20 mg by mouth daily as needed (for fluid).      No current facility-administered medications for this visit.     Past Surgical History:  Procedure Laterality Date  . BIV ICD  INSERTION CRT-D N/A 06/16/2018   Procedure: BIV ICD INSERTION CRT-D;  Surgeon: Constance Haw, MD;  Location: [facility];  Service: Cardiovascular;  Laterality: N/A;  . CORONARY ANGIOPLASTY WITH STENT PLACEMENT    . ICD IMPLANT  03/17/2019   BIV  . KNEE ARTHROPLASTY    . NEPHRECTOMY  1976     Allergies  Allergen Reactions  . Iodinated Diagnostic Agents Other (See Comments)    Kidney problems  . Iodine Other (See Comments)    Only has one kidney      Family History  Problem Relation Age of Onset  . Dementia Mother   . Diabetes Mother   . Heart attack Father   . Heart disease Father   . Hypertension Father      Social History Jonathan Floyd reports that he quit smoking about 32 years ago. He has never used smokeless tobacco. Jonathan Floyd  reports current alcohol use.   Review of Systems CONSTITUTIONAL: No weight loss, fever, chills, weakness or fatigue.  HEENT: Eyes: No visual loss, blurred vision, double vision or yellow sclerae.No hearing loss, sneezing, congestion, runny nose or sore throat.  SKIN: No rash or itching.  CARDIOVASCULAR: per hpi RESPIRATORY: No shortness of breath, cough or sputum.  GASTROINTESTINAL: No anorexia, nausea, vomiting or diarrhea. No abdominal pain or blood.  GENITOURINARY: No burning on urination, no polyuria NEUROLOGICAL: No headache, dizziness, syncope, paralysis, ataxia, numbness or tingling in the extremities. No change in bowel or bladder control.  MUSCULOSKELETAL: No muscle, back pain, joint pain or stiffness.  LYMPHATICS: No enlarged nodes. No history of splenectomy.  PSYCHIATRIC: No history of depression or anxiety.  ENDOCRINOLOGIC: No reports of sweating, cold or heat intolerance. No polyuria or polydipsia.  Marland Kitchen   Physical Examination Today's Vitals   07/25/19 1532  BP: (!) 168/92  Pulse: 77  SpO2: 96%  Weight: 236 lb 3.2 oz (107.1 kg)  Height: 5\' 9"  (1.753 m)   Body mass index is 34.88 kg/m.  Gen: resting comfortably, no acute distress HEENT: no scleral icterus, pupils equal round and reactive, no palptable cervical adenopathy,  CV: RRR, no m/r/g, no jvd Resp: Clear to auscultation bilaterally GI: abdomen is soft, non-tender, non-distended, normal bowel sounds, no hepatosplenomegaly MSK: extremities are warm, no edema.  Skin: warm, no rash Neuro:  no focal deficits Psych: appropriate affect   Diagnostic Studies 02/2018 nuclear stress  Defect 1: There is a large defect of moderate severity present in the basal inferoseptal, basal inferior, mid anteroseptal, mid inferoseptal, mid inferior, apical septal and apical inferior location.  Findings consistent with prior myocardial infarction. No significant ischemic zones.  This is a high risk study based on degree of scar  and significantly depressed LVEF.  Nuclear stress EF: 31%.    11/2017 echo Study Conclusions  - Left ventricle: The cavity size was mildly dilated. Wall thickness was increased in a pattern of moderate LVH. Systolic function was severely reduced. The estimated ejection fraction was in the range of 20% to 25%. Diffuse hypokinesis. Doppler parameters are consistent with abnormal left ventricular relaxation (grade 1 diastolic dysfunction). Doppler parameters are consistent with high ventricular filling pressure. - Aortic valve: There was mild regurgitation. - Mitral valve: There was mild regurgitation. - Left atrium: The atrium was mildly dilated. - Pulmonary arteries: Systolic pressure was mildly increased. PA peak pressure: 39 mm Hg (S).  Impressions:  - Severe global reduction in LV systolic function; moderate LVH; mild LVE; mild diastolic dysfunction; mild AI and  MR; mild LAE; trace TR with mild pulmonary hypertension.  04/2018 echo Study Conclusions  - Left ventricle: The cavity size was normal. Wall thickness was increased in a pattern of mild LVH. Systolic function was severely reduced. The estimated ejection fraction was in the range of 20% to 25%. Diffuse hypokinesis. Doppler parameters are consistent with abnormal left ventricular relaxation (grade 1 diastolic dysfunction). Doppler parameters are consistent with indeterminate ventricular filling pressure. - Regional wall motion abnormality: Akinesis of the basal-mid anteroseptal myocardium. - Aortic valve: Trileaflet; mildly thickened, mildly calcified leaflets. There was mild stenosis (low output, low gradient). There was mild regurgitation. Valve area (VTI): 1.72 cm^2. Valve area (Vmax): 1.69 cm^2. Valve area (Vmean): 1.66 cm^2. - Mitral valve: Mildly calcified annulus. Mildly thickened leaflets . There was moderate regurgitation. - Left atrium: The atrium was  moderately dilated. - Tricuspid valve: There was mild-moderate regurgitation. - Pulmonary arteries: PA peak pressure: 36 mm Hg (S).   02/2019 echo IMPRESSIONS    1. Left ventricular ejection fraction, by visual estimation, is 50 to  55%. The left ventricle has normal function. Normal left ventricular size.  There is severely increased left ventricular hypertrophy.  2. Left ventricular diastolic Doppler parameters are consistent with  impaired relaxation pattern of LV diastolic filling.  3. Global right ventricle has normal systolic function.The right  ventricular size is normal. Moderately increased right ventricular wall  thickness.  4. Left atrial size was normal.  5. Right atrial size was normal.  6. Mild mitral annular calcification.  7. The mitral valve is degenerative. Mild mitral valve regurgitation.  8. The tricuspid valve is grossly normal. Tricuspid valve regurgitation  is trivial.  9. The aortic valve is tricuspid Aortic valve regurgitation is mild by  color flow Doppler. Mild aortic valve stenosis.  10. The pulmonic valve was grossly normal. Pulmonic valve regurgitation is  not visualized by color flow Doppler.  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  12. Aortic dilatation noted.  13. Normal pulmonary artery systolic pressure.  14. The inferior vena cava is normal in size with greater than 50%  respiratory variability, suggesting right atrial pressure of 3 mmHg.    Assessment and Plan   1. Chronic systolic HF/CAD - medical therapy limited by renal dysfunction, not on ACEI/ARB/aldactone/arni -no significnat symptoms, conitnue current meds  2. CAD - no recent symptoms, continue current meds  3. HTN - high bp's, increase hydralazine back to 50mg  tid  4. Hyperlipidemia -he will continue statin       Arnoldo Lenis, M.D.

## 2019-07-25 NOTE — Patient Instructions (Signed)

## 2019-08-15 ENCOUNTER — Ambulatory Visit (INDEPENDENT_AMBULATORY_CARE_PROVIDER_SITE_OTHER): Payer: No Typology Code available for payment source

## 2019-08-15 DIAGNOSIS — I5022 Chronic systolic (congestive) heart failure: Secondary | ICD-10-CM

## 2019-08-15 DIAGNOSIS — Z95810 Presence of automatic (implantable) cardiac defibrillator: Secondary | ICD-10-CM

## 2019-08-19 NOTE — Progress Notes (Signed)
EPIC Encounter for ICM Monitoring  Patient Name: Jonathan Floyd is a 74 y.o. male Date: 08/19/2019 Primary Care Physican: [facility], [place] Primary Cardiologist:Branch Electrophysiologist:Allred Bi-V Pacing:99% 07/25/2019 officeWeight: 236lbs  AT/AF Burden <1%  Transmission reviewed.   CorvueThoracic impedance normal.  Prescribed: Torsemide20 mg take 1 tablet by mouth daily as needed (for fluid).   Labs: 01/17/2019 Creatinine 1.47, BUN 21, Potassium 4.0, Sodium 143, GFR 57 06/17/2018 Creatinine1.60, BUN13, Potassium4.3, [id number], GFR43-50 06/16/2018 Creatinine1.38, BUN15, Potassium3.7, [id number], GFR51-60  05/31/2018 Creatinine1.54, BUN15, Potassium4.4, Sodium142, [id number] A complete set of results can be found in Results Review.  Recommendations:None  Follow-up plan: ICM clinic phone appointment on5/07/2019. 91 day device clinic remote transmission 09/27/2019.  Office visit 09/23/2019 with Dr Rayann Heman  Copy of ICM check sent to Dr.Allred.  3 month ICM trend: 08/15/2019    1 Year ICM trend:       Rosalene Billings, RN 08/19/2019 1:17 PM

## 2019-08-22 DIAGNOSIS — M6281 Muscle weakness (generalized): Secondary | ICD-10-CM | POA: Diagnosis not present

## 2019-08-22 DIAGNOSIS — R0602 Shortness of breath: Secondary | ICD-10-CM | POA: Diagnosis not present

## 2019-08-22 DIAGNOSIS — Z79899 Other long term (current) drug therapy: Secondary | ICD-10-CM | POA: Diagnosis not present

## 2019-08-22 DIAGNOSIS — I700 Atherosclerosis of aorta: Secondary | ICD-10-CM | POA: Diagnosis not present

## 2019-08-22 DIAGNOSIS — R42 Dizziness and giddiness: Secondary | ICD-10-CM | POA: Diagnosis not present

## 2019-08-22 DIAGNOSIS — Z7982 Long term (current) use of aspirin: Secondary | ICD-10-CM | POA: Diagnosis not present

## 2019-08-22 DIAGNOSIS — Z87891 Personal history of nicotine dependence: Secondary | ICD-10-CM | POA: Diagnosis not present

## 2019-08-22 DIAGNOSIS — R110 Nausea: Secondary | ICD-10-CM | POA: Diagnosis not present

## 2019-08-22 DIAGNOSIS — Z955 Presence of coronary angioplasty implant and graft: Secondary | ICD-10-CM | POA: Diagnosis not present

## 2019-08-22 DIAGNOSIS — Z794 Long term (current) use of insulin: Secondary | ICD-10-CM | POA: Diagnosis not present

## 2019-08-22 DIAGNOSIS — I110 Hypertensive heart disease with heart failure: Secondary | ICD-10-CM | POA: Diagnosis not present

## 2019-08-22 DIAGNOSIS — Z905 Acquired absence of kidney: Secondary | ICD-10-CM | POA: Diagnosis not present

## 2019-08-22 DIAGNOSIS — Z95810 Presence of automatic (implantable) cardiac defibrillator: Secondary | ICD-10-CM | POA: Diagnosis not present

## 2019-08-22 DIAGNOSIS — N390 Urinary tract infection, site not specified: Secondary | ICD-10-CM | POA: Diagnosis not present

## 2019-08-22 DIAGNOSIS — R531 Weakness: Secondary | ICD-10-CM | POA: Diagnosis not present

## 2019-08-22 DIAGNOSIS — Z85528 Personal history of other malignant neoplasm of kidney: Secondary | ICD-10-CM | POA: Diagnosis not present

## 2019-08-22 DIAGNOSIS — R319 Hematuria, unspecified: Secondary | ICD-10-CM | POA: Diagnosis not present

## 2019-08-22 DIAGNOSIS — I509 Heart failure, unspecified: Secondary | ICD-10-CM | POA: Diagnosis not present

## 2019-08-22 DIAGNOSIS — Z8673 Personal history of transient ischemic attack (TIA), and cerebral infarction without residual deficits: Secondary | ICD-10-CM | POA: Diagnosis not present

## 2019-09-19 ENCOUNTER — Ambulatory Visit (INDEPENDENT_AMBULATORY_CARE_PROVIDER_SITE_OTHER): Payer: Medicare Other

## 2019-09-19 DIAGNOSIS — I5022 Chronic systolic (congestive) heart failure: Secondary | ICD-10-CM

## 2019-09-19 DIAGNOSIS — Z95810 Presence of automatic (implantable) cardiac defibrillator: Secondary | ICD-10-CM

## 2019-09-20 NOTE — Progress Notes (Signed)
EPIC Encounter for ICM Monitoring  Patient Name: Jonathan Floyd is a 74 y.o. male Date: 09/20/2019 Primary Care Physican: [facility], [place] Primary Cardiologist:Branch Electrophysiologist:Allred Bi-V Pacing:99% 07/25/2019 officeWeight: 236lbs  AT/AF Burden <1% (taking aspirin)  Spoke with patient.  Heart failure questions reviewed.  He does not need Torsemide unless needed per nephrologist.  He gets Neely Cecena of breath when he has fluid accumulation.  CorvueThoracic impedance normal.  He took 1 Torsemide during decreased impedance due to SOB.    Prescribed: Torsemide20 mg take 1 tablet by mouth daily as needed (for fluid).   Labs: 01/17/2019 Creatinine 1.47, BUN 21, Potassium 4.0, Sodium 143, GFR 57 06/17/2018 Creatinine1.60, BUN13, Potassium4.3, [id number], GFR43-50 06/16/2018 Creatinine1.38, BUN15, Potassium3.7, [id number], GFR51-60  05/31/2018 Creatinine1.54, BUN15, Potassium4.4, Sodium142, [id number] A complete set of results can be found in Results Review.  Recommendations: No changes and encouraged to call if experiencing any fluid symptoms.  Encouraged to fill out DPR form at 5/7 office visit.   Follow-up plan: ICM clinic phone appointment on6/11/2019. 91 day device clinic remote transmission5/03/2020.Office visit 09/23/2019 with Dr Rayann Heman  Copy of ICM check sent to Dr.Allred.  3 month ICM trend: 09/19/2019    1 Year ICM trend:     AT/AF   Rosalene Billings, RN 09/20/2019 2:07 PM

## 2019-09-23 ENCOUNTER — Other Ambulatory Visit: Payer: Self-pay

## 2019-09-23 ENCOUNTER — Ambulatory Visit (INDEPENDENT_AMBULATORY_CARE_PROVIDER_SITE_OTHER): Payer: Medicare Other | Admitting: Internal Medicine

## 2019-09-23 ENCOUNTER — Encounter: Payer: Self-pay | Admitting: Internal Medicine

## 2019-09-23 VITALS — BP 160/90 | HR 70 | Ht 69.0 in | Wt 232.0 lb

## 2019-09-23 DIAGNOSIS — I5022 Chronic systolic (congestive) heart failure: Secondary | ICD-10-CM

## 2019-09-23 DIAGNOSIS — I2510 Atherosclerotic heart disease of native coronary artery without angina pectoris: Secondary | ICD-10-CM

## 2019-09-23 DIAGNOSIS — E782 Mixed hyperlipidemia: Secondary | ICD-10-CM | POA: Diagnosis not present

## 2019-09-23 DIAGNOSIS — I10 Essential (primary) hypertension: Secondary | ICD-10-CM

## 2019-09-23 DIAGNOSIS — I130 Hypertensive heart and chronic kidney disease with heart failure and stage 1 through stage 4 chronic kidney disease, or unspecified chronic kidney disease: Secondary | ICD-10-CM | POA: Diagnosis not present

## 2019-09-23 LAB — CUP PACEART INCLINIC DEVICE CHECK
Battery Remaining Longevity: 58 mo
Brady Statistic RA Percent Paced: 4.7 %
Brady Statistic RV Percent Paced: 99 %
Date Time Interrogation Session: 20210507113520
HighPow Impedance: 81 Ohm
Implantable Lead Implant Date: 20200129
Implantable Lead Implant Date: 20200129
Implantable Lead Implant Date: 20200129
Implantable Lead Location: 753858
Implantable Lead Location: 753859
Implantable Lead Location: 753860
Implantable Pulse Generator Implant Date: 20200129
Lead Channel Impedance Value: 1175 Ohm
Lead Channel Impedance Value: 412.5 Ohm
Lead Channel Impedance Value: 475 Ohm
Lead Channel Pacing Threshold Amplitude: 0.75 V
Lead Channel Pacing Threshold Amplitude: 0.75 V
Lead Channel Pacing Threshold Amplitude: 0.75 V
Lead Channel Pacing Threshold Amplitude: 1.875 V
Lead Channel Pacing Threshold Pulse Width: 0.5 ms
Lead Channel Pacing Threshold Pulse Width: 0.5 ms
Lead Channel Pacing Threshold Pulse Width: 0.5 ms
Lead Channel Pacing Threshold Pulse Width: 1 ms
Lead Channel Sensing Intrinsic Amplitude: 12 mV
Lead Channel Sensing Intrinsic Amplitude: 4.6 mV
Lead Channel Setting Pacing Amplitude: 2 V
Lead Channel Setting Pacing Amplitude: 2 V
Lead Channel Setting Pacing Amplitude: 2.375
Lead Channel Setting Pacing Pulse Width: 0.5 ms
Lead Channel Setting Pacing Pulse Width: 1 ms
Lead Channel Setting Sensing Sensitivity: 0.5 mV
Pulse Gen Serial Number: 9877212

## 2019-09-23 MED ORDER — CARVEDILOL 25 MG PO TABS: 25.0000 mg | ORAL_TABLET | Freq: Two times a day (BID) | ORAL | 3 refills | Status: DC

## 2019-09-23 NOTE — Patient Instructions (Signed)
Medication Instructions:   Increase Coreg to 25mg  twice a day.  Continue all other medications.    Labwork: none  Testing/Procedures: none  Follow-Up: Your physician wants you to follow up in:  1 year.  You will receive a reminder letter in the mail one-two months in advance.  If you don't receive a letter, please call our office to schedule the follow up appointment   Any Other Special Instructions Will Be Listed Below (If Applicable).  If you need a refill on your cardiac medications before your next appointment, please call your pharmacy.

## 2019-09-23 NOTE — Progress Notes (Signed)
PCP: [facility], [facility] Primary Cardiologist: Dr Harl Bowie Primary EP: Dr Rayann Heman  Jonathan Floyd is a 74 y.o. male who presents today for routine electrophysiology followup.  Since last being seen in our clinic, the patient reports doing very well.  Today, he denies symptoms of palpitations, chest pain, shortness of breath,  lower extremity edema, dizziness, presyncope, syncope, or ICD shocks.  The patient is otherwise without complaint today.   Past Medical History:  Diagnosis Date  . AICD (automatic cardioverter/defibrillator) present 06/16/2018  . Cancer ([place])    43 years ago- kidney taken out for it  . Chronic systolic CHF (congestive heart failure) ([place])   . CKD (chronic kidney disease), stage III    1 kidney  . Coronary artery disease 2017   DES pLAD, [place]. 2 other stents in 2014  . Diabetes mellitus without complication ([place])   . Hypertension   . Renal insufficiency    Past Surgical History:  Procedure Laterality Date  . BIV ICD INSERTION CRT-D N/A 06/16/2018   Procedure: BIV ICD INSERTION CRT-D;  Surgeon: Constance Haw, MD;  Location: [facility];  Service: Cardiovascular;  Laterality: N/A;  . CORONARY ANGIOPLASTY WITH STENT PLACEMENT    . ICD IMPLANT  03/17/2019   BIV  . KNEE ARTHROPLASTY    . NEPHRECTOMY  1976    ROS- all systems are reviewed and negative except as per HPI above  Current Outpatient Medications  Medication Sig Dispense Refill  . acetaminophen (TYLENOL) 500 MG tablet Take 1,000 mg by mouth every 6 (six) hours as needed for mild pain or headache.     Marland Kitchen aspirin EC 81 MG tablet Take 81 mg by mouth daily.    Marland Kitchen atorvastatin (LIPITOR) 10 MG tablet Take 10 mg by mouth at bedtime.     . carvedilol (COREG) 12.5 MG tablet Take 12.5 mg by mouth 2 (two) times daily with a meal.    . finasteride (PROSCAR) 5 MG tablet Take 5 mg by mouth daily.    . hydrALAZINE (APRESOLINE) 50 MG tablet Take 1 tablet (50 mg total) by mouth 3 (three) times  daily. 270 tablet 1  . HYDROcodone-acetaminophen (NORCO/VICODIN) 5-325 MG tablet Take 1 tablet by mouth every 6 (six) hours as needed.    . insulin glargine (LANTUS) 100 UNIT/ML injection Inject 30 Units into the skin at bedtime.    . isosorbide dinitrate (ISORDIL) 10 MG tablet Take 30 mg by mouth 3 (three) times daily.     . nitroGLYCERIN (NITROSTAT) 0.4 MG SL tablet Place 0.4 mg under the tongue every 5 (five) minutes as needed for chest pain.    . potassium & sodium phosphates (PHOS-NAK) 280-160-250 MG PACK Take 1 packet by mouth 2 (two) times daily.    Marland Kitchen torsemide (DEMADEX) 20 MG tablet Take 20 mg by mouth daily as needed (for fluid).      No current facility-administered medications for this visit.    Physical Exam: Vitals:   09/23/19 1110  BP: (!) 160/90  Pulse: 70  SpO2: 97%  Weight: 232 lb (105.2 kg)  Height: 5\' 9"  (1.753 m)    GEN- The patient is well appearing, alert and oriented x 3 today.   Head- normocephalic, atraumatic Eyes-  Sclera clear, conjunctiva pink Ears- hearing intact Oropharynx- clear Lungs-  normal work of breathing Chest- ICD pocket is well healed Heart- Regular rate and rhythm  GI- soft, NT, ND, + BS Extremities- no clubbing, cyanosis, +1 edema  ICD interrogation- reviewed in detail today,  See PACEART report  ekg tracing ordered today is personally reviewed and shows sinus with BiV pacing  Wt Readings from Last 3 Encounters:  09/23/19 232 lb (105.2 kg)  07/25/19 236 lb 3.2 oz (107.1 kg)  01/20/19 240 lb 9.6 oz (109.1 kg)    Assessment and Plan:  1.  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 CAD No ischemic symptoms  3. 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 HL Continue lipitor  Return in a year  Thompson Grayer MD, [facility] 09/23/2019 11:27 AM

## 2019-09-27 ENCOUNTER — Ambulatory Visit (INDEPENDENT_AMBULATORY_CARE_PROVIDER_SITE_OTHER): Payer: Medicare Other | Admitting: *Deleted

## 2019-09-27 DIAGNOSIS — I429 Cardiomyopathy, unspecified: Secondary | ICD-10-CM

## 2019-09-27 DIAGNOSIS — I5020 Unspecified systolic (congestive) heart failure: Secondary | ICD-10-CM

## 2019-09-27 LAB — CUP PACEART REMOTE DEVICE CHECK
Battery Remaining Longevity: 59 mo
Battery Remaining Percentage: 82 %
Battery Voltage: 2.99 V
Brady Statistic AP VP Percent: 1.6 %
Brady Statistic AP VS Percent: 1 %
Brady Statistic AS VP Percent: 98 %
Brady Statistic AS VS Percent: 1 %
Brady Statistic RA Percent Paced: 1.3 %
Date Time Interrogation Session: 20210511020017
HighPow Impedance: 75 Ohm
HighPow Impedance: 75 Ohm
Implantable Lead Implant Date: 20200129
Implantable Lead Implant Date: 20200129
Implantable Lead Implant Date: 20200129
Implantable Lead Location: 753858
Implantable Lead Location: 753859
Implantable Lead Location: 753860
Implantable Pulse Generator Implant Date: 20200129
Lead Channel Impedance Value: 1200 Ohm
Lead Channel Impedance Value: 410 Ohm
Lead Channel Impedance Value: 450 Ohm
Lead Channel Pacing Threshold Amplitude: 0.75 V
Lead Channel Pacing Threshold Amplitude: 0.75 V
Lead Channel Pacing Threshold Amplitude: 1.875 V
Lead Channel Pacing Threshold Pulse Width: 0.5 ms
Lead Channel Pacing Threshold Pulse Width: 0.5 ms
Lead Channel Pacing Threshold Pulse Width: 1 ms
Lead Channel Sensing Intrinsic Amplitude: 12 mV
Lead Channel Sensing Intrinsic Amplitude: 5 mV
Lead Channel Setting Pacing Amplitude: 2 V
Lead Channel Setting Pacing Amplitude: 2 V
Lead Channel Setting Pacing Amplitude: 2.375
Lead Channel Setting Pacing Pulse Width: 0.5 ms
Lead Channel Setting Pacing Pulse Width: 1 ms
Lead Channel Setting Sensing Sensitivity: 0.5 mV
Pulse Gen Serial Number: 9877212

## 2019-09-28 NOTE — Progress Notes (Signed)
Remote ICD transmission.   

## 2019-10-24 ENCOUNTER — Ambulatory Visit (INDEPENDENT_AMBULATORY_CARE_PROVIDER_SITE_OTHER): Payer: No Typology Code available for payment source

## 2019-10-24 DIAGNOSIS — I5020 Unspecified systolic (congestive) heart failure: Secondary | ICD-10-CM

## 2019-10-24 DIAGNOSIS — Z95810 Presence of automatic (implantable) cardiac defibrillator: Secondary | ICD-10-CM | POA: Diagnosis not present

## 2019-10-26 NOTE — Progress Notes (Signed)
EPIC Encounter for ICM Monitoring  Patient Name: Jonathan Floyd is a 74 y.o. male Date: 10/26/2019 Primary Care Physican: [facility], [place] Primary Cardiologist:Branch Electrophysiologist:Allred Bi-V Pacing:>99% 09/23/2019 officeWeight: 232lbs  AT/AF Burden <1% (taking aspirin)  Spoke with patient and reports feeling well at this time.  Denies fluid symptoms.    CorvueThoracic impedance normal.    Prescribed: Torsemide20 mg take 1 tablet by mouth daily as needed (for fluid).   Labs: 01/17/2019 Creatinine 1.47, BUN 21, Potassium 4.0, Sodium 143, GFR 57 06/17/2018 Creatinine1.60, BUN13, Potassium4.3, [id number], GFR43-50 06/16/2018 Creatinine1.38, BUN15, Potassium3.7, [id number], GFR51-60  05/31/2018 Creatinine1.54, BUN15, Potassium4.4, Sodium142, [id number] A complete set of results can be found in Results Review.  Recommendations: No changes and encouraged to call if experiencing any fluid symptoms.   Follow-up plan: ICM clinic phone appointment on7/04/2020. 91 day device clinic remote transmission8/02/2020.  Copy of ICM check sent to Dr.Allred.  3 month ICM trend: 10/24/2019    1 Year ICM trend:       Rosalene Billings, RN 10/26/2019 10:30 AM

## 2019-11-28 ENCOUNTER — Ambulatory Visit (INDEPENDENT_AMBULATORY_CARE_PROVIDER_SITE_OTHER): Payer: No Typology Code available for payment source

## 2019-11-28 DIAGNOSIS — I5020 Unspecified systolic (congestive) heart failure: Secondary | ICD-10-CM

## 2019-11-28 DIAGNOSIS — Z95810 Presence of automatic (implantable) cardiac defibrillator: Secondary | ICD-10-CM

## 2019-11-30 ENCOUNTER — Telehealth: Payer: Self-pay

## 2019-11-30 NOTE — Telephone Encounter (Signed)
Remote ICM transmission received.  Attempted call to patient on home and cell phone number regarding ICM remote transmission and left detailed message per DPR.  Advised to return call for any fluid symptoms or questions. Next ICM remote transmission scheduled 01/02/2020.

## 2019-11-30 NOTE — Progress Notes (Signed)
EPIC Encounter for ICM Monitoring  Patient Name: Jonathan Floyd is a 74 y.o. male Date: 11/30/2019 Primary Care Physican: [facility], [place] Electrophysiologist:Allred Bi-V Pacing:>99% 09/23/2019 officeWeight: 232lbs  AT/AF Burden <1%(taking aspirin)  Attempted call to patient and unable to reach.  Left detailed message per DPR regarding transmission. Transmission reviewed.   CorvueThoracic impedance normal.  Prescribed: Torsemide20 mg take 1 tablet by mouth daily as needed (for fluid).   Labs: 01/17/2019 Creatinine 1.47, BUN 21, Potassium 4.0, Sodium 143, GFR 57 06/17/2018 Creatinine1.60, BUN13, Potassium4.3, [id number], GFR43-50 06/16/2018 Creatinine1.38, BUN15, Potassium3.7, [id number], GFR51-60  05/31/2018 Creatinine1.54, BUN15, Potassium4.4, Sodium142, [id number] A complete set of results can be found in Results Review.  Recommendations:Left voice mail with ICM number and encouraged to call if experiencing any fluid symptoms..  Follow-up plan: ICM clinic phone appointment on8/16/2021. 91 day device clinic remote transmission8/02/2020.  EP/Cardiology Office Visits: 01/27/2020 with Dr. Harl Bowie and recall for 09/21/2020 with Dr Rayann Heman.    Copy of ICM check sent to Dr. Rayann Heman.   3 month ICM trend: 11/28/2019    1 Year ICM trend:       Rosalene Billings, RN 11/30/2019 3:21 PM

## 2019-12-27 ENCOUNTER — Ambulatory Visit (INDEPENDENT_AMBULATORY_CARE_PROVIDER_SITE_OTHER): Payer: No Typology Code available for payment source | Admitting: *Deleted

## 2019-12-27 DIAGNOSIS — I5022 Chronic systolic (congestive) heart failure: Secondary | ICD-10-CM | POA: Diagnosis not present

## 2019-12-27 LAB — CUP PACEART REMOTE DEVICE CHECK
Battery Remaining Longevity: 59 mo
Battery Remaining Percentage: 79 %
Battery Voltage: 2.99 V
Brady Statistic AP VP Percent: 6.2 %
Brady Statistic AP VS Percent: 1 %
Brady Statistic AS VP Percent: 93 %
Brady Statistic AS VS Percent: 1 %
Brady Statistic RA Percent Paced: 5.7 %
Date Time Interrogation Session: 20210810020019
HighPow Impedance: 73 Ohm
HighPow Impedance: 73 Ohm
Implantable Lead Implant Date: 20200129
Implantable Lead Implant Date: 20200129
Implantable Lead Implant Date: 20200129
Implantable Lead Location: 753858
Implantable Lead Location: 753859
Implantable Lead Location: 753860
Implantable Pulse Generator Implant Date: 20200129
Lead Channel Impedance Value: 1200 Ohm
Lead Channel Impedance Value: 390 Ohm
Lead Channel Impedance Value: 450 Ohm
Lead Channel Pacing Threshold Amplitude: 0.75 V
Lead Channel Pacing Threshold Amplitude: 0.75 V
Lead Channel Pacing Threshold Amplitude: 2.625 V
Lead Channel Pacing Threshold Pulse Width: 0.5 ms
Lead Channel Pacing Threshold Pulse Width: 0.5 ms
Lead Channel Pacing Threshold Pulse Width: 1 ms
Lead Channel Sensing Intrinsic Amplitude: 12 mV
Lead Channel Sensing Intrinsic Amplitude: 3.9 mV
Lead Channel Setting Pacing Amplitude: 2 V
Lead Channel Setting Pacing Amplitude: 2 V
Lead Channel Setting Pacing Amplitude: 3.125
Lead Channel Setting Pacing Pulse Width: 0.5 ms
Lead Channel Setting Pacing Pulse Width: 1 ms
Lead Channel Setting Sensing Sensitivity: 0.5 mV
Pulse Gen Serial Number: 9877212

## 2019-12-29 NOTE — Progress Notes (Signed)
Remote ICD transmission.   

## 2020-01-02 ENCOUNTER — Ambulatory Visit (INDEPENDENT_AMBULATORY_CARE_PROVIDER_SITE_OTHER): Payer: No Typology Code available for payment source

## 2020-01-02 DIAGNOSIS — I5022 Chronic systolic (congestive) heart failure: Secondary | ICD-10-CM

## 2020-01-02 DIAGNOSIS — Z95810 Presence of automatic (implantable) cardiac defibrillator: Secondary | ICD-10-CM

## 2020-01-06 NOTE — Progress Notes (Signed)
EPIC Encounter for ICM Monitoring  Patient Name: Jonathan Floyd is a 75 y.o. male Date: 01/06/2020 Primary Care Physican: [facility], [place] Electrophysiologist:Allred Bi-V Pacing:99% 01/06/2020 officeWeight: 231lbs  AT/AF Burden <1%(taking aspirin)  Spoke with patient and reports feeling well at this time.  Denies fluid symptoms.    CorvueThoracic impedance normal but was suggesting possible fluid accumulation 7/28-8/10.  Prescribed: Torsemide20 mg take 1 tablet by mouth daily as needed (for fluid).   Labs: 01/17/2019 Creatinine 1.47, BUN 21, Potassium 4.0, Sodium 143, GFR 57 06/17/2018 Creatinine1.60, BUN13, Potassium4.3, [id number], GFR43-50 06/16/2018 Creatinine1.38, BUN15, Potassium3.7, [id number], GFR51-60  05/31/2018 Creatinine1.54, BUN15, Potassium4.4, Sodium142, [id number] A complete set of results can be found in Results Review.  Recommendations: No changes and encouraged to call if experiencing any fluid symptoms.  Follow-up plan: ICM clinic phone appointment on9/20/2021. 91 day device clinic remote transmission11/12/2019.  EP/Cardiology Office Visits: 01/27/2020 with Dr. Harl Bowie and recall for 09/21/2020 with Dr Rayann Heman.    Copy of ICM check sent to Dr. Rayann Heman.    3 month ICM trend: 01/02/2020    1 Year ICM trend:       Rosalene Billings, RN 01/06/2020 3:07 PM

## 2020-01-27 ENCOUNTER — Ambulatory Visit (INDEPENDENT_AMBULATORY_CARE_PROVIDER_SITE_OTHER): Payer: Medicare Other | Admitting: Cardiology

## 2020-01-27 ENCOUNTER — Encounter: Payer: Self-pay | Admitting: Cardiology

## 2020-01-27 ENCOUNTER — Other Ambulatory Visit: Payer: Self-pay

## 2020-01-27 VITALS — BP 146/78 | HR 76 | Ht 69.0 in | Wt 231.0 lb

## 2020-01-27 DIAGNOSIS — E782 Mixed hyperlipidemia: Secondary | ICD-10-CM

## 2020-01-27 DIAGNOSIS — I5022 Chronic systolic (congestive) heart failure: Secondary | ICD-10-CM

## 2020-01-27 DIAGNOSIS — I10 Essential (primary) hypertension: Secondary | ICD-10-CM

## 2020-01-27 DIAGNOSIS — I2510 Atherosclerotic heart disease of native coronary artery without angina pectoris: Secondary | ICD-10-CM | POA: Diagnosis not present

## 2020-01-27 NOTE — Progress Notes (Signed)
Clinical Summary Mr. Alicea is a 74 y.o.male seen today for follow up of the following medical problems.   1. Chronic systolic HF - echo 06/5051 LVEF 97-67%, grade I diastolic dysfunction -Jan 29/2020 had BiV AICD placed.   02/2019 echo: LVEF 50-55%, grade I DDX,  - no recent SOB or DOE - takes torsemide prn, takes once week.   2. History of CAD - history of prior cardiac stents done through [place]. Last in 2017.  - he has a stent card: Nov 2017 xience to prox LAD - EKG withchronicLBBB  -02/2018 nuclear stress large scar as reported below, no significant ischemia.  -no recent chest pain - compliant with meds - 12/2019 normal device check.   3. HTN - compliant with meds   4. Hyperlipidemia -labs followed by [facility]  02/2019 LDL 74 - he is on statin  5. CKD III - history of nephrectomy. Followed by nephrology - most recent labs with [facility]     SH: fan of [employer]  Past Medical History:  Diagnosis Date  . AICD (automatic cardioverter/defibrillator) present 06/16/2018  . Cancer ([place])    43 years ago- kidney taken out for it  . Chronic systolic CHF (congestive heart failure) ([place])   . CKD (chronic kidney disease), stage III    1 kidney  . Coronary artery disease 2017   DES pLAD, [place]. 2 other stents in 2014  . Diabetes mellitus without complication ([place])   . Hypertension   . Renal insufficiency      Allergies  Allergen Reactions  . Iodinated Diagnostic Agents Other (See Comments)    Kidney problems  . Iodine Other (See Comments)    Only has one kidney     Current Outpatient Medications  Medication Sig Dispense Refill  . acetaminophen (TYLENOL) 500 MG tablet Take 1,000 mg by mouth every 6 (six) hours as needed for mild pain or headache.     Marland Kitchen aspirin EC 81 MG tablet Take 81 mg by mouth daily.    Marland Kitchen atorvastatin (LIPITOR) 10 MG tablet Take 10 mg by mouth at bedtime.     . carvedilol (COREG) 25 MG tablet  Take 1 tablet (25 mg total) by mouth 2 (two) times daily. 180 tablet 3  . finasteride (PROSCAR) 5 MG tablet Take 5 mg by mouth daily.    . hydrALAZINE (APRESOLINE) 50 MG tablet Take 1 tablet (50 mg total) by mouth 3 (three) times daily. 270 tablet 1  . HYDROcodone-acetaminophen (NORCO/VICODIN) 5-325 MG tablet Take 1 tablet by mouth every 6 (six) hours as needed.    . insulin glargine (LANTUS) 100 UNIT/ML injection Inject 30 Units into the skin at bedtime.    . isosorbide dinitrate (ISORDIL) 10 MG tablet Take 30 mg by mouth 3 (three) times daily.     . nitroGLYCERIN (NITROSTAT) 0.4 MG SL tablet Place 0.4 mg under the tongue every 5 (five) minutes as needed for chest pain.    . potassium & sodium phosphates (PHOS-NAK) 280-160-250 MG PACK Take 1 packet by mouth 2 (two) times daily.    Marland Kitchen torsemide (DEMADEX) 20 MG tablet Take 20 mg by mouth daily as needed (for fluid).      No current facility-administered medications for this visit.     Past Surgical History:  Procedure Laterality Date  . BIV ICD INSERTION CRT-D N/A 06/16/2018   Procedure: BIV ICD INSERTION CRT-D;  Surgeon: Constance Haw, MD;  Location: [facility];  Service: Cardiovascular;  Laterality: N/A;  . CORONARY ANGIOPLASTY WITH STENT PLACEMENT    . ICD IMPLANT  03/17/2019   BIV  . KNEE ARTHROPLASTY    . NEPHRECTOMY  1976     Allergies  Allergen Reactions  . Iodinated Diagnostic Agents Other (See Comments)    Kidney problems  . Iodine Other (See Comments)    Only has one kidney      Family History  Problem Relation Age of Onset  . Dementia Mother   . Diabetes Mother   . Heart attack Father   . Heart disease Father   . Hypertension Father      Social History Mr. Cherry reports that he quit smoking about 32 years ago. He has never used smokeless tobacco. Mr. Basaldua reports current alcohol use.   Review of Systems CONSTITUTIONAL: No weight loss, fever, chills, weakness or fatigue.  HEENT: Eyes: No  visual loss, blurred vision, double vision or yellow sclerae.No hearing loss, sneezing, congestion, runny nose or sore throat.  SKIN: No rash or itching.  CARDIOVASCULAR: per hpi RESPIRATORY: No shortness of breath, cough or sputum.  GASTROINTESTINAL: No anorexia, nausea, vomiting or diarrhea. No abdominal pain or blood.  GENITOURINARY: No burning on urination, no polyuria NEUROLOGICAL: No headache, dizziness, syncope, paralysis, ataxia, numbness or tingling in the extremities. No change in bowel or bladder control.  MUSCULOSKELETAL: No muscle, back pain, joint pain or stiffness.  LYMPHATICS: No enlarged nodes. No history of splenectomy.  PSYCHIATRIC: No history of depression or anxiety.  ENDOCRINOLOGIC: No reports of sweating, cold or heat intolerance. No polyuria or polydipsia.  Marland Kitchen   Physical Examination Today's Vitals   01/27/20 1522  BP: (!) 146/78  Pulse: 76  SpO2: 98%  Weight: 231 lb (104.8 kg)  Height: 5\' 9"  (1.753 m)   Body mass index is 34.11 kg/m.  Gen: resting comfortably, no acute distress HEENT: no scleral icterus, pupils equal round and reactive, no palptable cervical adenopathy,  CV: RRR, no m/r/g, no jvd Resp: Clear to auscultation bilaterally GI: abdomen is soft, non-tender, non-distended, normal bowel sounds, no hepatosplenomegaly MSK: extremities are warm, no edema.  Skin: warm, no rash Neuro:  no focal deficits Psych: appropriate affect   Diagnostic Studies  02/2018 nuclear stress  Defect 1: There is a large defect of moderate severity present in the basal inferoseptal, basal inferior, mid anteroseptal, mid inferoseptal, mid inferior, apical septal and apical inferior location.  Findings consistent with prior myocardial infarction. No significant ischemic zones.  This is a high risk study based on degree of scar and significantly depressed LVEF.  Nuclear stress EF: 31%.    11/2017 echo Study Conclusions  - Left ventricle: The cavity size was  mildly dilated. Wall thickness was increased in a pattern of moderate LVH. Systolic function was severely reduced. The estimated ejection fraction was in the range of 20% to 25%. Diffuse hypokinesis. Doppler parameters are consistent with abnormal left ventricular relaxation (grade 1 diastolic dysfunction). Doppler parameters are consistent with high ventricular filling pressure. - Aortic valve: There was mild regurgitation. - Mitral valve: There was mild regurgitation. - Left atrium: The atrium was mildly dilated. - Pulmonary arteries: Systolic pressure was mildly increased. PA peak pressure: 39 mm Hg (S).  Impressions:  - Severe global reduction in LV systolic function; moderate LVH; mild LVE; mild diastolic dysfunction; mild AI and MR; mild LAE; trace TR with mild pulmonary hypertension.  04/2018 echo Study Conclusions  - Left ventricle: The cavity size was normal. Wall thickness  was increased in a pattern of mild LVH. Systolic function was severely reduced. The estimated ejection fraction was in the range of 20% to 25%. Diffuse hypokinesis. Doppler parameters are consistent with abnormal left ventricular relaxation (grade 1 diastolic dysfunction). Doppler parameters are consistent with indeterminate ventricular filling pressure. - Regional wall motion abnormality: Akinesis of the basal-mid anteroseptal myocardium. - Aortic valve: Trileaflet; mildly thickened, mildly calcified leaflets. There was mild stenosis (low output, low gradient). There was mild regurgitation. Valve area (VTI): 1.72 cm^2. Valve area (Vmax): 1.69 cm^2. Valve area (Vmean): 1.66 cm^2. - Mitral valve: Mildly calcified annulus. Mildly thickened leaflets . There was moderate regurgitation. - Left atrium: The atrium was moderately dilated. - Tricuspid valve: There was mild-moderate regurgitation. - Pulmonary arteries: PA peak pressure: 36 mm Hg (S).   02/2019  echo IMPRESSIONS    1. Left ventricular ejection fraction, by visual estimation, is 50 to  55%. The left ventricle has normal function. Normal left ventricular size.  There is severely increased left ventricular hypertrophy.  2. Left ventricular diastolic Doppler parameters are consistent with  impaired relaxation pattern of LV diastolic filling.  3. Global right ventricle has normal systolic function.The right  ventricular size is normal. Moderately increased right ventricular wall  thickness.  4. Left atrial size was normal.  5. Right atrial size was normal.  6. Mild mitral annular calcification.  7. The mitral valve is degenerative. Mild mitral valve regurgitation.  8. The tricuspid valve is grossly normal. Tricuspid valve regurgitation  is trivial.  9. The aortic valve is tricuspid Aortic valve regurgitation is mild by  color flow Doppler. Mild aortic valve stenosis.  10. The pulmonic valve was grossly normal. Pulmonic valve regurgitation is  not visualized by color flow Doppler.  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  12. Aortic dilatation noted.  13. Normal pulmonary artery systolic pressure.  14. The inferior vena cava is normal in size with greater than 50%  respiratory variability, suggesting right atrial pressure of 3 mmHg.    Assessment and Plan  1. Chronic systolic HF/CAD - medical therapy limited by renal dysfunction, not on ACEI/ARB/aldactone/arni -most recent echo shows LVEF has normalized - no symptoms, continue current meds  2. CAD -no symptoms, continue current meds  3. HTN - elevated today but at goal with renal appt last month, monitor bp's over the week and call us. If needed would increase hydral to 75mg  tid  4. Hyperlipidemia - LDL essentially at goal, continue statin      Arnoldo Lenis, M.D.

## 2020-01-27 NOTE — Patient Instructions (Signed)
Your physician recommends that you schedule a follow-up appointment in: [place]  Your physician recommends that you continue on your current medications as directed. Please refer to the Current Medication list given to you today.  Your physician has requested that you regularly monitor and record your blood pressure readings at home FOR 1 WEEK AND CALL us WITH READINGS - CHECK BLOOD PRESSURE AT THE SAME TIME DAILY   Thank you for choosing [facility]!!

## 2020-02-06 ENCOUNTER — Ambulatory Visit (INDEPENDENT_AMBULATORY_CARE_PROVIDER_SITE_OTHER): Payer: No Typology Code available for payment source

## 2020-02-06 DIAGNOSIS — I5022 Chronic systolic (congestive) heart failure: Secondary | ICD-10-CM

## 2020-02-06 DIAGNOSIS — Z95810 Presence of automatic (implantable) cardiac defibrillator: Secondary | ICD-10-CM | POA: Diagnosis not present

## 2020-02-08 ENCOUNTER — Telehealth: Payer: Self-pay

## 2020-02-08 NOTE — Progress Notes (Signed)
EPIC Encounter for ICM Monitoring  Patient Name: SIGISMUND CROSS is a 74 y.o. male Date: 02/08/2020 Primary Care Physican: [facility] [place] Primary Cardiologist: Branch Electrophysiologist:Allred Bi-V Pacing:99% 01/27/2020 officeWeight: 231lbs  AT/AF Burden <1%(taking aspirin)  Attempted call to patient and unable to reach.  Left detailed message per DPR regarding transmission. Transmission reviewed.   CorvueThoracic impedance normal.    Prescribed: Torsemide20 mg take 1 tablet by mouth daily as needed (for fluid).   Labs: 01/17/2019 Creatinine 1.47, BUN 21, Potassium 4.0, Sodium 143, GFR 57 06/17/2018 Creatinine1.60, BUN13, Potassium4.3, [id number], GFR43-50 06/16/2018 Creatinine1.38, BUN15, Potassium3.7, [id number], GFR51-60  05/31/2018 Creatinine1.54, BUN15, Potassium4.4, Sodium142, [id number] A complete set of results can be found in Results Review.  Recommendations: Left voice mail with ICM number and encouraged to call if experiencing any fluid symptoms.  Follow-up plan: ICM clinic phone appointment on10/25/2021. 91 day device clinic remote transmission11/12/2019.  EP/Cardiology Office Visits: 3/10/20221 with Dr.Branch and recall for 09/21/2020 with Dr Rayann Heman.  Copy of ICM check sent to Dr.Allred.    3 month ICM trend: 02/06/2020    1 Year ICM trend:       Rosalene Billings, RN 02/08/2020 9:37 AM

## 2020-02-08 NOTE — Telephone Encounter (Signed)
Remote ICM transmission received.  Attempted call to patient regarding ICM remote transmission and left detailed message per DPR.  Advised to return call for any fluid symptoms or questions. Next ICM remote transmission scheduled 03/12/2020.   ° ° °

## 2020-03-12 ENCOUNTER — Ambulatory Visit (INDEPENDENT_AMBULATORY_CARE_PROVIDER_SITE_OTHER): Payer: No Typology Code available for payment source

## 2020-03-12 DIAGNOSIS — I5022 Chronic systolic (congestive) heart failure: Secondary | ICD-10-CM

## 2020-03-12 DIAGNOSIS — Z95810 Presence of automatic (implantable) cardiac defibrillator: Secondary | ICD-10-CM

## 2020-03-14 ENCOUNTER — Telehealth: Payer: Self-pay

## 2020-03-14 NOTE — Telephone Encounter (Signed)
Remote ICM transmission received.  Attempted call to patient regarding ICM remote transmission and left detailed message per DPR.  Advised to return call for any fluid symptoms or questions. Next ICM remote transmission scheduled 04/16/2020.

## 2020-03-14 NOTE — Progress Notes (Signed)
EPIC Encounter for ICM Monitoring  Patient Name: EON ZUNKER is a 74 y.o. male Date: 03/14/2020 Primary Care Physican: [facility] [place] Primary Cardiologist: Branch Electrophysiologist:Allred Bi-V Pacing:99% 01/27/2020 officeWeight: 231lbs  AT/AF Burden <1%(taking aspirin)  Attempted call to patient and unable to reach.  Left detailed message per DPR regarding transmission. Transmission reviewed.   CorvueThoracic impedance normal.    Prescribed: Torsemide20 mg take 1 tablet by mouth daily as needed (for fluid).   Labs: 01/17/2019 Creatinine 1.47, BUN 21, Potassium 4.0, Sodium 143, GFR 57 06/17/2018 Creatinine1.60, BUN13, Potassium4.3, [id number], GFR43-50 06/16/2018 Creatinine1.38, BUN15, Potassium3.7, [id number], GFR51-60  05/31/2018 Creatinine1.54, BUN15, Potassium4.4, Sodium142, [id number] A complete set of results can be found in Results Review.  Recommendations:Left voice mail with ICM number and encouraged to call if experiencing any fluid symptoms.  Follow-up plan: ICM clinic phone appointment on11/29/2021. 91 day device clinic remote transmission11/12/2019.  EP/Cardiology Office Visits: 07/26/2020 with Dr.Branch and recall for 09/21/2020 with Dr Rayann Heman.  Copy of ICM check sent to Dr.Allred.   3 month ICM trend: 03/12/2020    1 Year ICM trend:       Rosalene Billings, RN 03/14/2020 3:35 PM

## 2020-03-27 ENCOUNTER — Ambulatory Visit (INDEPENDENT_AMBULATORY_CARE_PROVIDER_SITE_OTHER): Payer: No Typology Code available for payment source

## 2020-03-27 DIAGNOSIS — I5020 Unspecified systolic (congestive) heart failure: Secondary | ICD-10-CM

## 2020-03-27 LAB — CUP PACEART REMOTE DEVICE CHECK
Battery Remaining Longevity: 56 mo
Battery Remaining Percentage: 75 %
Battery Voltage: 2.98 V
Brady Statistic AP VP Percent: 6.9 %
Brady Statistic AP VS Percent: 1 %
Brady Statistic AS VP Percent: 92 %
Brady Statistic AS VS Percent: 1 %
Brady Statistic RA Percent Paced: 6.3 %
Date Time Interrogation Session: 20211109020017
HighPow Impedance: 77 Ohm
HighPow Impedance: 77 Ohm
Implantable Lead Implant Date: 20200129
Implantable Lead Implant Date: 20200129
Implantable Lead Implant Date: 20200129
Implantable Lead Location: 753858
Implantable Lead Location: 753859
Implantable Lead Location: 753860
Implantable Pulse Generator Implant Date: 20200129
Lead Channel Impedance Value: 1250 Ohm
Lead Channel Impedance Value: 390 Ohm
Lead Channel Impedance Value: 440 Ohm
Lead Channel Pacing Threshold Amplitude: 0.75 V
Lead Channel Pacing Threshold Amplitude: 0.75 V
Lead Channel Pacing Threshold Amplitude: 2.375 V
Lead Channel Pacing Threshold Pulse Width: 0.5 ms
Lead Channel Pacing Threshold Pulse Width: 0.5 ms
Lead Channel Pacing Threshold Pulse Width: 1 ms
Lead Channel Sensing Intrinsic Amplitude: 12 mV
Lead Channel Sensing Intrinsic Amplitude: 4.6 mV
Lead Channel Setting Pacing Amplitude: 2 V
Lead Channel Setting Pacing Amplitude: 2 V
Lead Channel Setting Pacing Amplitude: 2.875
Lead Channel Setting Pacing Pulse Width: 0.5 ms
Lead Channel Setting Pacing Pulse Width: 1 ms
Lead Channel Setting Sensing Sensitivity: 0.5 mV
Pulse Gen Serial Number: 9877212

## 2020-03-29 NOTE — Progress Notes (Signed)
Remote ICD transmission.   

## 2020-04-16 ENCOUNTER — Ambulatory Visit (INDEPENDENT_AMBULATORY_CARE_PROVIDER_SITE_OTHER): Payer: No Typology Code available for payment source

## 2020-04-16 DIAGNOSIS — Z95810 Presence of automatic (implantable) cardiac defibrillator: Secondary | ICD-10-CM

## 2020-04-16 DIAGNOSIS — I5022 Chronic systolic (congestive) heart failure: Secondary | ICD-10-CM | POA: Diagnosis not present

## 2020-04-20 NOTE — Progress Notes (Signed)
EPIC Encounter for ICM Monitoring  Patient Name: Jonathan Floyd is a 73 y.o. male Date: 04/20/2020 Primary Care Physican: [facility] [place] Primary Cardiologist:Branch Electrophysiologist:Allred Bi-V Pacing:99% 01/27/2020 officeWeight: 231lbs  AT/AF Burden <1%(taking aspirin)  Transmission reviewed.  CorvueThoracic impedance normal.   Prescribed: Torsemide20 mg take 1 tablet by mouth daily as needed (for fluid).   Labs: 01/17/2019 Creatinine 1.47, BUN 21, Potassium 4.0, Sodium 143, GFR 57 06/17/2018 Creatinine1.60, BUN13, Potassium4.3, [id number], GFR43-50 06/16/2018 Creatinine1.38, BUN15, Potassium3.7, [id number], GFR51-60  05/31/2018 Creatinine1.54, BUN15, Potassium4.4, Sodium142, [id number] A complete set of results can be found in Results Review.  Recommendations:No changes.  Follow-up plan: ICM clinic phone appointment on1/07/2020. 91 day device clinic remote transmission2/12/2020.  EP/Cardiology Office Visits:07/26/2020 with Dr.Branch.   09/21/2020 with Dr Rayann Heman.  Copy of ICM check sent to Dr.Allred.   3 month ICM trend: 04/16/2020    1 Year ICM trend:       Rosalene Billings, RN 04/20/2020 11:01 AM

## 2020-05-21 ENCOUNTER — Ambulatory Visit (INDEPENDENT_AMBULATORY_CARE_PROVIDER_SITE_OTHER): Payer: No Typology Code available for payment source

## 2020-05-21 DIAGNOSIS — I5022 Chronic systolic (congestive) heart failure: Secondary | ICD-10-CM | POA: Diagnosis not present

## 2020-05-21 DIAGNOSIS — Z95810 Presence of automatic (implantable) cardiac defibrillator: Secondary | ICD-10-CM

## 2020-05-22 ENCOUNTER — Telehealth: Payer: Self-pay

## 2020-05-22 NOTE — Telephone Encounter (Signed)
Remote ICM transmission received.  Attempted call to patient regarding ICM remote transmission and voice mail box not set up. 

## 2020-05-22 NOTE — Progress Notes (Signed)
EPIC Encounter for ICM Monitoring  Patient Name: Jonathan Floyd is a 75 y.o. male Date: 05/22/2020 Primary Care Physican: [facility] Primary Cardiologist:Branch Electrophysiologist:Allred Bi-V Pacing:99% 01/27/2020 officeWeight: 231lbs  AT/AF Burden <1%(taking aspirin)  Attempted call to patient and unable to reach.   Transmission reviewed.   CorvueThoracic impedance suggesting possible fluid accumulation since 05/18/2020.   Prescribed: Torsemide20 mg take 1 tablet by mouth daily as needed (for fluid).   Labs: 01/17/2019 Creatinine 1.47, BUN 21, Potassium 4.0, Sodium 143, GFR 57 06/17/2018 Creatinine1.60, BUN13, Potassium4.3, Sodium137, GFR43-50 06/16/2018 Creatinine1.38, BUN15, Potassium3.7, Sodium137, GFR51-60  05/31/2018 Creatinine1.54, BUN15, Potassium4.4, Sodium142, FTD32-20 A complete set of results can be found in Results Review.  Recommendations:Unable to reach.  Will advise patient to take PRN Torsemide if reached.  Follow-up plan: ICM clinic phone appointment on1/03/2021 to recheck fluid levels. 91 day device clinic remote transmission2/12/2020.  EP/Cardiology Office Visits:07/26/2020 with Dr.Branch.   09/21/2020 with Dr Johney Frame.  Copy of ICM check sent to Dr.Allred and Dr Wyline Mood.  3 month ICM trend: 05/21/2020.    1 Year ICM trend:       Karie Soda, RN 05/22/2020 12:56 PM

## 2020-05-29 ENCOUNTER — Ambulatory Visit (INDEPENDENT_AMBULATORY_CARE_PROVIDER_SITE_OTHER): Payer: No Typology Code available for payment source

## 2020-05-29 DIAGNOSIS — I5022 Chronic systolic (congestive) heart failure: Secondary | ICD-10-CM

## 2020-05-29 DIAGNOSIS — Z95810 Presence of automatic (implantable) cardiac defibrillator: Secondary | ICD-10-CM

## 2020-06-01 NOTE — Progress Notes (Signed)
EPIC Encounter for ICM Monitoring  Patient Name: KEITHEN CAPO is a 74 y.o. male Date: 06/01/2020 Primary Care Physican: [facility] [place] Primary Cardiologist:Branch Electrophysiologist:Allred Bi-V Pacing:99% 03/07/2020 officeWeight: 228lbs  AT/AF Burden <1%(taking aspirin)  Transmission reviewed.   CorvueThoracic impedance suggesting fluid levels returned to normal.   Prescribed: Torsemide20 mg take 1 tablet by mouth daily as needed (for fluid).   Labs: 01/17/2019 Creatinine 1.47, BUN 21, Potassium 4.0, Sodium 143, GFR 57 06/17/2018 Creatinine1.60, BUN13, Potassium4.3, [id number], GFR43-50 06/16/2018 Creatinine1.38, BUN15, Potassium3.7, [id number], GFR51-60  05/31/2018 Creatinine1.54, BUN15, Potassium4.4, Sodium142, [id number] A complete set of results can be found in Results Review.  Recommendations:No changes  Follow-up plan: ICM clinic phone appointment on2/21/2022. 91 day device clinic remote transmission2/12/2020.  EP/Cardiology Office Visits:07/26/2020 with Dr.Branch.09/21/2020 with Dr Rayann Heman.  Copy of ICM check sent to Dr.Allred   3 month ICM trend: 05/29/2020.    1 Year ICM trend:       Rosalene Billings, RN 06/01/2020 10:22 AM

## 2020-06-26 ENCOUNTER — Ambulatory Visit (INDEPENDENT_AMBULATORY_CARE_PROVIDER_SITE_OTHER): Payer: No Typology Code available for payment source

## 2020-06-26 DIAGNOSIS — I5022 Chronic systolic (congestive) heart failure: Secondary | ICD-10-CM

## 2020-06-26 LAB — CUP PACEART REMOTE DEVICE CHECK
Battery Remaining Longevity: 53 mo
Battery Remaining Percentage: 72 %
Battery Voltage: 2.98 V
Brady Statistic AP VP Percent: 6.2 %
Brady Statistic AP VS Percent: 1 %
Brady Statistic AS VP Percent: 93 %
Brady Statistic AS VS Percent: 1 %
Brady Statistic RA Percent Paced: 5.7 %
Date Time Interrogation Session: 20220208020024
HighPow Impedance: 79 Ohm
HighPow Impedance: 79 Ohm
Implantable Lead Implant Date: 20200129
Implantable Lead Implant Date: 20200129
Implantable Lead Implant Date: 20200129
Implantable Lead Location: 753858
Implantable Lead Location: 753859
Implantable Lead Location: 753860
Implantable Pulse Generator Implant Date: 20200129
Lead Channel Impedance Value: 1275 Ohm
Lead Channel Impedance Value: 390 Ohm
Lead Channel Impedance Value: 430 Ohm
Lead Channel Pacing Threshold Amplitude: 0.75 V
Lead Channel Pacing Threshold Amplitude: 0.75 V
Lead Channel Pacing Threshold Amplitude: 2.375 V
Lead Channel Pacing Threshold Pulse Width: 0.5 ms
Lead Channel Pacing Threshold Pulse Width: 0.5 ms
Lead Channel Pacing Threshold Pulse Width: 1 ms
Lead Channel Sensing Intrinsic Amplitude: 12 mV
Lead Channel Sensing Intrinsic Amplitude: 4.1 mV
Lead Channel Setting Pacing Amplitude: 2 V
Lead Channel Setting Pacing Amplitude: 2 V
Lead Channel Setting Pacing Amplitude: 2.875
Lead Channel Setting Pacing Pulse Width: 0.5 ms
Lead Channel Setting Pacing Pulse Width: 1 ms
Lead Channel Setting Sensing Sensitivity: 0.5 mV
Pulse Gen Serial Number: 9877212

## 2020-07-02 NOTE — Progress Notes (Signed)
Remote ICD transmission.   

## 2020-07-09 ENCOUNTER — Ambulatory Visit (INDEPENDENT_AMBULATORY_CARE_PROVIDER_SITE_OTHER): Payer: No Typology Code available for payment source

## 2020-07-09 DIAGNOSIS — Z95810 Presence of automatic (implantable) cardiac defibrillator: Secondary | ICD-10-CM

## 2020-07-09 DIAGNOSIS — I5022 Chronic systolic (congestive) heart failure: Secondary | ICD-10-CM

## 2020-07-17 NOTE — Progress Notes (Signed)
EPIC Encounter for ICM Monitoring  Patient Name: Jonathan Floyd is a 75 y.o. male Date: 07/17/2020 Primary Care Physican: [facility] [place] Primary Cardiologist:Branch Electrophysiologist:Allred Bi-V Pacing:99% 03/07/2020 officeWeight: 228lbs  AT/AF Burden <1%(taking aspirin)  Transmission reviewed.  CorvueThoracic impedancenormal but was suggesting possible fluid accumulation from 2/11-2/19.  Prescribed: Torsemide20 mg take 1 tablet by mouth daily as needed (for fluid).   Labs: 01/17/2019 Creatinine 1.47, BUN 21, Potassium 4.0, Sodium 143, GFR 57 06/17/2018 Creatinine1.60, BUN13, Potassium4.3, [id number], GFR43-50 06/16/2018 Creatinine1.38, BUN15, Potassium3.7, [id number], GFR51-60  05/31/2018 Creatinine1.54, BUN15, Potassium4.4, Sodium142, [id number] A complete set of results can be found in Results Review.  Recommendations:No changes  Follow-up plan: ICM clinic phone appointment on4/08/2020. 91 day device clinic remote transmission5/02/2021.  EP/Cardiology Office Visits:07/26/2020 with Dr.Branch.09/21/2020 with Dr Rayann Heman.  Copy of ICM check sent to Dr.Allred.   3 month ICM trend: 07/09/2020.    1 Year ICM trend:       Rosalene Billings, RN 07/17/2020 12:38 PM

## 2020-07-26 ENCOUNTER — Encounter: Payer: Self-pay | Admitting: Cardiology

## 2020-07-26 ENCOUNTER — Encounter: Payer: Self-pay | Admitting: *Deleted

## 2020-07-26 ENCOUNTER — Ambulatory Visit (INDEPENDENT_AMBULATORY_CARE_PROVIDER_SITE_OTHER): Payer: Medicare Other | Admitting: Cardiology

## 2020-07-26 VITALS — BP 118/64 | HR 86 | Ht 69.0 in | Wt 224.0 lb

## 2020-07-26 DIAGNOSIS — I10 Essential (primary) hypertension: Secondary | ICD-10-CM | POA: Diagnosis not present

## 2020-07-26 DIAGNOSIS — I2510 Atherosclerotic heart disease of native coronary artery without angina pectoris: Secondary | ICD-10-CM

## 2020-07-26 DIAGNOSIS — E782 Mixed hyperlipidemia: Secondary | ICD-10-CM

## 2020-07-26 DIAGNOSIS — I5022 Chronic systolic (congestive) heart failure: Secondary | ICD-10-CM

## 2020-07-26 NOTE — Patient Instructions (Signed)
Your physician recommends that you schedule a follow-up appointment in: 6 MONTHS WITH DR BRANCH  Your physician recommends that you continue on your current medications as directed. Please refer to the Current Medication list given to you today.  Thank you for choosing [facility]!!    

## 2020-07-26 NOTE — Progress Notes (Signed)
Clinical Summary Jonathan Floyd is a 75 y.o.male seen today for follow up of the following medical problems.   1. Chronic systolic HF - echo 10/7891 LVEF 81-01%, grade I diastolic dysfunction -Jan 29/2020 had BiV AICD placed.   02/2019 echo: LVEF 50-55%, grade I DDX, - no recent SOB or DOE - takes torsemide prn, takes once week.   - no SOB/DOE. No recent edema.  - 06/2020 normal device    2. History of CAD - history of prior cardiac stents done through [place]. Last in 2017.  - he has a stent card: Nov 2017 xience to prox LAD - EKG withchronicLBBB  -02/2018 nuclear stress large scar as reported below, no significant ischemia.  -no recent chest pain. No SOB/DOE  3. HTN - compliant with meds   4. Hyperlipidemia -labs followed by [facility]  02/2019 LDL 74 - remains compliant with statin  5. CKD III - history of nephrectomy. Followed by nephrology Dr Theador Hawthorne      SH: fan of [employer]   Past Medical History:  Diagnosis Date  . AICD (automatic cardioverter/defibrillator) present 06/16/2018  . Cancer ([place])    43 years ago- kidney taken out for it  . Chronic systolic CHF (congestive heart failure) ([place])   . CKD (chronic kidney disease), stage III    1 kidney  . Coronary artery disease 2017   DES pLAD, [place]. 2 other stents in 2014  . Diabetes mellitus without complication ([place])   . Hypertension   . Renal insufficiency      Allergies  Allergen Reactions  . Iodinated Diagnostic Agents Other (See Comments)    Kidney problems  . Iodine Other (See Comments)    Only has one kidney     Current Outpatient Medications  Medication Sig Dispense Refill  . acetaminophen (TYLENOL) 500 MG tablet Take 1,000 mg by mouth every 6 (six) hours as needed for mild pain or headache.     Marland Kitchen aspirin EC 81 MG tablet Take 81 mg by mouth daily.    Marland Kitchen atorvastatin (LIPITOR) 10 MG tablet Take 10 mg by mouth at bedtime.     . carvedilol  (COREG) 25 MG tablet Take 1 tablet (25 mg total) by mouth 2 (two) times daily. 180 tablet 3  . finasteride (PROSCAR) 5 MG tablet Take 5 mg by mouth daily.    . hydrALAZINE (APRESOLINE) 50 MG tablet Take 1 tablet (50 mg total) by mouth 3 (three) times daily. 270 tablet 1  . HYDROcodone-acetaminophen (NORCO/VICODIN) 5-325 MG tablet Take 1 tablet by mouth every 6 (six) hours as needed.    . insulin glargine (LANTUS) 100 UNIT/ML injection Inject 30 Units into the skin at bedtime.    . isosorbide dinitrate (ISORDIL) 10 MG tablet Take 30 mg by mouth 3 (three) times daily.     . nitroGLYCERIN (NITROSTAT) 0.4 MG SL tablet Place 0.4 mg under the tongue every 5 (five) minutes as needed for chest pain.    . potassium & sodium phosphates (PHOS-NAK) 280-160-250 MG PACK Take 1 packet by mouth 2 (two) times daily.    Marland Kitchen torsemide (DEMADEX) 20 MG tablet Take 20 mg by mouth daily as needed (for fluid).      No current facility-administered medications for this visit.     Past Surgical History:  Procedure Laterality Date  . BIV ICD INSERTION CRT-D N/A 06/16/2018   Procedure: BIV ICD INSERTION CRT-D;  Surgeon: Constance Haw, MD;  Location: [place]  CV LAB;  Service: Cardiovascular;  Laterality: N/A;  . CORONARY ANGIOPLASTY WITH STENT PLACEMENT    . ICD IMPLANT  03/17/2019   BIV  . KNEE ARTHROPLASTY    . NEPHRECTOMY  1976     Allergies  Allergen Reactions  . Iodinated Diagnostic Agents Other (See Comments)    Kidney problems  . Iodine Other (See Comments)    Only has one kidney      Family History  Problem Relation Age of Onset  . Dementia Mother   . Diabetes Mother   . Heart attack Father   . Heart disease Father   . Hypertension Father      Social History Jonathan Floyd reports that he quit smoking about 33 years ago. He has never used smokeless tobacco. Jonathan Floyd reports current alcohol use.   Review of Systems CONSTITUTIONAL: No weight loss, fever, chills, weakness or  fatigue.  HEENT: Eyes: No visual loss, blurred vision, double vision or yellow sclerae.No hearing loss, sneezing, congestion, runny nose or sore throat.  SKIN: No rash or itching.  CARDIOVASCULAR: per hpi RESPIRATORY: No shortness of breath, cough or sputum.  GASTROINTESTINAL: No anorexia, nausea, vomiting or diarrhea. No abdominal pain or blood.  GENITOURINARY: No burning on urination, no polyuria NEUROLOGICAL: No headache, dizziness, syncope, paralysis, ataxia, numbness or tingling in the extremities. No change in bowel or bladder control.  MUSCULOSKELETAL: No muscle, back pain, joint pain or stiffness.  LYMPHATICS: No enlarged nodes. No history of splenectomy.  PSYCHIATRIC: No history of depression or anxiety.  ENDOCRINOLOGIC: No reports of sweating, cold or heat intolerance. No polyuria or polydipsia.  Marland Kitchen   Physical Examination Today's Vitals   07/26/20 1247  BP: 118/64  Pulse: 86  SpO2: 94%  Weight: 224 lb (101.6 kg)  Height: 5\' 9"  (1.753 m)   Body mass index is 33.08 kg/m. Gen: resting comfortably, no acute distress HEENT: no scleral icterus, pupils equal round and reactive, no palptable cervical adenopathy,  CV: RRR, no m/r/gl no jvd Resp: Clear to auscultation bilaterally GI: abdomen is soft, non-tender, non-distended, normal bowel sounds, no hepatosplenomegaly MSK: extremities are warm, no edema.  Skin: warm, no rash Neuro:  no focal deficits Psych: appropriate affect   Diagnostic Studies 02/2018 nuclear stress  Defect 1: There is a large defect of moderate severity present in the basal inferoseptal, basal inferior, mid anteroseptal, mid inferoseptal, mid inferior, apical septal and apical inferior location.  Findings consistent with prior myocardial infarction. No significant ischemic zones.  This is a high risk study based on degree of scar and significantly depressed LVEF.  Nuclear stress EF: 31%.    11/2017 echo Study Conclusions  - Left ventricle:  The cavity size was mildly dilated. Wall thickness was increased in a pattern of moderate LVH. Systolic function was severely reduced. The estimated ejection fraction was in the range of 20% to 25%. Diffuse hypokinesis. Doppler parameters are consistent with abnormal left ventricular relaxation (grade 1 diastolic dysfunction). Doppler parameters are consistent with high ventricular filling pressure. - Aortic valve: There was mild regurgitation. - Mitral valve: There was mild regurgitation. - Left atrium: The atrium was mildly dilated. - Pulmonary arteries: Systolic pressure was mildly increased. PA peak pressure: 39 mm Hg (S).  Impressions:  - Severe global reduction in LV systolic function; moderate LVH; mild LVE; mild diastolic dysfunction; mild AI and MR; mild LAE; trace TR with mild pulmonary hypertension.  04/2018 echo Study Conclusions  - Left ventricle: The cavity size was normal. Wall thickness  was increased in a pattern of mild LVH. Systolic function was severely reduced. The estimated ejection fraction was in the range of 20% to 25%. Diffuse hypokinesis. Doppler parameters are consistent with abnormal left ventricular relaxation (grade 1 diastolic dysfunction). Doppler parameters are consistent with indeterminate ventricular filling pressure. - Regional wall motion abnormality: Akinesis of the basal-mid anteroseptal myocardium. - Aortic valve: Trileaflet; mildly thickened, mildly calcified leaflets. There was mild stenosis (low output, low gradient). There was mild regurgitation. Valve area (VTI): 1.72 cm^2. Valve area (Vmax): 1.69 cm^2. Valve area (Vmean): 1.66 cm^2. - Mitral valve: Mildly calcified annulus. Mildly thickened leaflets . There was moderate regurgitation. - Left atrium: The atrium was moderately dilated. - Tricuspid valve: There was mild-moderate regurgitation. - Pulmonary arteries: PA peak pressure: 36 mm Hg  (S).   02/2019 echo IMPRESSIONS    1. Left ventricular ejection fraction, by visual estimation, is 50 to  55%. The left ventricle has normal function. Normal left ventricular size.  There is severely increased left ventricular hypertrophy.  2. Left ventricular diastolic Doppler parameters are consistent with  impaired relaxation pattern of LV diastolic filling.  3. Global right ventricle has normal systolic function.The right  ventricular size is normal. Moderately increased right ventricular wall  thickness.  4. Left atrial size was normal.  5. Right atrial size was normal.  6. Mild mitral annular calcification.  7. The mitral valve is degenerative. Mild mitral valve regurgitation.  8. The tricuspid valve is grossly normal. Tricuspid valve regurgitation  is trivial.  9. The aortic valve is tricuspid Aortic valve regurgitation is mild by  color flow Doppler. Mild aortic valve stenosis.  10. The pulmonic valve was grossly normal. Pulmonic valve regurgitation is  not visualized by color flow Doppler.  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  12. Aortic dilatation noted.  13. Normal pulmonary artery systolic pressure.  14. The inferior vena cava is normal in size with greater than 50%  respiratory variability, suggesting right atrial pressure of 3 mmHg.      Assessment and Plan  1. Chronic systolic HF/CAD - medical therapy limited by renal dysfunction, not on ACEI/ARB/aldactone/arni -most recent echo shows LVEF has normalized - denies any symptoms, euvolemic in clinic. Continue current meds  2. CAD -denies symptoms, continue current meds  3. HTN - at goal, continue current meds  4. Hyperlipidemia - request pcp labs, continue statin     Arnoldo Lenis, M.D.

## 2020-08-20 ENCOUNTER — Ambulatory Visit (INDEPENDENT_AMBULATORY_CARE_PROVIDER_SITE_OTHER): Payer: No Typology Code available for payment source

## 2020-08-20 DIAGNOSIS — I5022 Chronic systolic (congestive) heart failure: Secondary | ICD-10-CM | POA: Diagnosis not present

## 2020-08-20 DIAGNOSIS — Z95810 Presence of automatic (implantable) cardiac defibrillator: Secondary | ICD-10-CM | POA: Diagnosis not present

## 2020-08-21 ENCOUNTER — Telehealth: Payer: Self-pay

## 2020-08-21 NOTE — Telephone Encounter (Signed)
Remote ICM transmission received.  Attempted call to patient regarding ICM remote transmission and no answer.  Voice mail box is not set up for messages.

## 2020-08-21 NOTE — Progress Notes (Signed)
EPIC Encounter for ICM Monitoring  Patient Name: Jonathan Floyd is a 75 y.o. male Date: 08/21/2020 Primary Care Physican: [facility] [place] Primary Cardiologist:Branch Electrophysiologist:Allred Bi-V Pacing:99% 07/26/2020 officeWeight: 224lbs  AT/AF Burden <1%(taking aspirin)  Attempted call to patient and unable to reach.   Transmission reviewed.   CorvueThoracic impedancenormal.  Prescribed: Torsemide20 mg take 1 tablet by mouth daily as needed (for fluid).   Labs: 01/17/2019 Creatinine 1.47, BUN 21, Potassium 4.0, Sodium 143, GFR 57 06/17/2018 Creatinine1.60, BUN13, Potassium4.3, [id number], GFR43-50 06/16/2018 Creatinine1.38, BUN15, Potassium3.7, [id number], GFR51-60  05/31/2018 Creatinine1.54, BUN15, Potassium4.4, Sodium142, MMI19-47 A complete set of results can be found in Results Review.  Recommendations:Unable to reach.    Follow-up plan: ICM clinic phone appointment on6/10/2020. 91 day device clinic remote transmission5/02/2021.  EP/Cardiology Office Visits:09/21/2020 with Dr Rayann Heman.  Copy of ICM check sent to Dr.Allred.   3 month ICM trend: 08/20/2020.    1 Year ICM trend:       Rosalene Billings, RN 08/21/2020 11:06 AM

## 2020-09-21 ENCOUNTER — Ambulatory Visit (INDEPENDENT_AMBULATORY_CARE_PROVIDER_SITE_OTHER): Payer: Medicare Other | Admitting: Internal Medicine

## 2020-09-21 ENCOUNTER — Encounter: Payer: Self-pay | Admitting: Internal Medicine

## 2020-09-21 VITALS — BP 212/102 | HR 67 | Ht 69.0 in | Wt 220.8 lb

## 2020-09-21 DIAGNOSIS — I5022 Chronic systolic (congestive) heart failure: Secondary | ICD-10-CM | POA: Diagnosis not present

## 2020-09-21 DIAGNOSIS — I2510 Atherosclerotic heart disease of native coronary artery without angina pectoris: Secondary | ICD-10-CM

## 2020-09-21 DIAGNOSIS — I10 Essential (primary) hypertension: Secondary | ICD-10-CM | POA: Diagnosis not present

## 2020-09-21 DIAGNOSIS — I429 Cardiomyopathy, unspecified: Secondary | ICD-10-CM | POA: Diagnosis not present

## 2020-09-21 LAB — CUP PACEART INCLINIC DEVICE CHECK
Battery Remaining Longevity: 50 mo
Brady Statistic RA Percent Paced: 6 %
Brady Statistic RV Percent Paced: 99 %
Date Time Interrogation Session: 20220506120657
HighPow Impedance: 76.5 Ohm
Implantable Lead Implant Date: 20200129
Implantable Lead Implant Date: 20200129
Implantable Lead Implant Date: 20200129
Implantable Lead Location: 753858
Implantable Lead Location: 753859
Implantable Lead Location: 753860
Implantable Pulse Generator Implant Date: 20200129
Lead Channel Impedance Value: 1162.5 Ohm
Lead Channel Impedance Value: 387.5 Ohm
Lead Channel Impedance Value: 425 Ohm
Lead Channel Pacing Threshold Amplitude: 0.75 V
Lead Channel Pacing Threshold Amplitude: 0.75 V
Lead Channel Pacing Threshold Amplitude: 2.375 V
Lead Channel Pacing Threshold Pulse Width: 0.5 ms
Lead Channel Pacing Threshold Pulse Width: 0.5 ms
Lead Channel Pacing Threshold Pulse Width: 1 ms
Lead Channel Sensing Intrinsic Amplitude: 12 mV
Lead Channel Sensing Intrinsic Amplitude: 5 mV
Lead Channel Setting Pacing Amplitude: 2 V
Lead Channel Setting Pacing Amplitude: 2 V
Lead Channel Setting Pacing Amplitude: 2.875
Lead Channel Setting Pacing Pulse Width: 0.5 ms
Lead Channel Setting Pacing Pulse Width: 1 ms
Lead Channel Setting Sensing Sensitivity: 0.5 mV
Pulse Gen Serial Number: 9877212

## 2020-09-21 NOTE — Progress Notes (Signed)
PCP: [facility] Primary Cardiologist: Dr Harl Bowie Primary EP: Dr Rayann Heman  Jonathan Floyd is a 75 y.o. male who presents today for routine electrophysiology followup.  Since last being seen in our clinic, the patient reports doing very well.  Today, he denies symptoms of palpitations, chest pain, shortness of breath,  lower extremity edema, dizziness, presyncope, syncope, or ICD shocks.  The patient is otherwise without complaint today.   Past Medical History:  Diagnosis Date  . AICD (automatic cardioverter/defibrillator) present 06/16/2018  . Cancer ([place])    43 years ago- kidney taken out for it  . Chronic systolic CHF (congestive heart failure) ([place])   . CKD (chronic kidney disease), stage III ([place])    1 kidney  . Coronary artery disease 2017   DES pLAD, [place]. 2 other stents in 2014  . Diabetes mellitus without complication ([place])   . Hypertension   . Renal insufficiency    Past Surgical History:  Procedure Laterality Date  . BIV ICD INSERTION CRT-D N/A 06/16/2018   Procedure: BIV ICD INSERTION CRT-D;  Surgeon: Constance Haw, MD;  Location: [place] CV LAB;  Service: Cardiovascular;  Laterality: N/A;  . CORONARY ANGIOPLASTY WITH STENT PLACEMENT    . ICD IMPLANT  03/17/2019   BIV  . KNEE ARTHROPLASTY    . NEPHRECTOMY  1976    ROS- all systems are reviewed and negative except as per HPI above  Current Outpatient Medications  Medication Sig Dispense Refill  . acetaminophen (TYLENOL) 500 MG tablet Take 1,000 mg by mouth every 6 (six) hours as needed for mild pain or headache.     Marland Kitchen aspirin EC 81 MG tablet Take 81 mg by mouth daily.    Marland Kitchen atorvastatin (LIPITOR) 10 MG tablet Take 10 mg by mouth at bedtime.     . carvedilol (COREG) 25 MG tablet Take 1 tablet (25 mg total) by mouth 2 (two) times daily. 180 tablet 3  . finasteride (PROSCAR) 5 MG tablet Take 5 mg by mouth daily.    . hydrALAZINE (APRESOLINE) 50 MG tablet Take 1 tablet (50 mg total) by mouth 3  (three) times daily. 270 tablet 1  . insulin glargine (LANTUS) 100 UNIT/ML injection Inject 30 Units into the skin at bedtime.    . isosorbide dinitrate (ISORDIL) 10 MG tablet Take 30 mg by mouth 3 (three) times daily.     . nitroGLYCERIN (NITROSTAT) 0.4 MG SL tablet Place 0.4 mg under the tongue every 5 (five) minutes as needed for chest pain.    . potassium & sodium phosphates (PHOS-NAK) 280-160-250 MG PACK Take 1 packet by mouth 2 (two) times daily.    Marland Kitchen torsemide (DEMADEX) 20 MG tablet Take 20 mg by mouth daily as needed (for fluid).      No current facility-administered medications for this visit.    Physical Exam: Vitals:   09/21/20 1113  BP: (!) 212/102  Pulse: 67  SpO2: 98%  Weight: 220 lb 12.8 oz (100.2 kg)  Height: 5\' 9"  (1.753 m)    GEN- The patient is well appearing, alert and oriented x 3 today.   Head- normocephalic, atraumatic Eyes-  Sclera clear, conjunctiva pink Ears- hearing intact Oropharynx- clear Lungs- Clear to ausculation bilaterally, normal work of breathing Chest- ICD pocket is well healed Heart- Regular rate and rhythm, no murmurs, rubs or gallops, PMI not laterally displaced GI- soft, NT, ND, + BS Extremities- no clubbing, cyanosis, or edema  ICD interrogation- reviewed in detail  today,  See PACEART report  ekg tracing ordered today is personally reviewed and shows sinus with BiV pacing  Wt Readings from Last 3 Encounters:  09/21/20 220 lb 12.8 oz (100.2 kg)  07/26/20 224 lb (101.6 kg)  01/27/20 231 lb (104.8 kg)    Assessment and Plan:  1.  Chronic systolic dysfunction/ nonischemic CM euvolemic today EF has recovered with CRT Stable on an appropriate medical regimen Normal ICD function See [facility] report No changes today he is not device dependant today followed in ICM device clinic  2. HTN Stable No change required today  3. CAD No ischemic symptoms  4. HL Continue statin  Return in a year  Thompson Grayer MD,  [facility] 09/21/2020 11:36 AM

## 2020-09-21 NOTE — Addendum Note (Signed)
Addended by: Berlinda Last on: 09/21/2020 03:28 PM   Modules accepted: Orders

## 2020-09-21 NOTE — Patient Instructions (Signed)
Medication Instructions:  Continue all current medications.  Labwork: none  Testing/Procedures: none  Follow-Up: 1 year - Allred   Any Other Special Instructions Will Be Listed Below (If Applicable).  If you need a refill on your cardiac medications before your next appointment, please call your pharmacy.  

## 2020-09-25 ENCOUNTER — Ambulatory Visit (INDEPENDENT_AMBULATORY_CARE_PROVIDER_SITE_OTHER): Payer: No Typology Code available for payment source

## 2020-09-25 DIAGNOSIS — I429 Cardiomyopathy, unspecified: Secondary | ICD-10-CM | POA: Diagnosis not present

## 2020-09-25 LAB — CUP PACEART REMOTE DEVICE CHECK
Battery Remaining Longevity: 50 mo
Battery Remaining Percentage: 69 %
Battery Voltage: 2.96 V
Brady Statistic AP VP Percent: 4.8 %
Brady Statistic AP VS Percent: 0 %
Brady Statistic AS VP Percent: 95 %
Brady Statistic AS VS Percent: 1 %
Brady Statistic RA Percent Paced: 4 %
Date Time Interrogation Session: 20220510020016
HighPow Impedance: 75 Ohm
HighPow Impedance: 75 Ohm
Implantable Lead Implant Date: 20200129
Implantable Lead Implant Date: 20200129
Implantable Lead Implant Date: 20200129
Implantable Lead Location: 753858
Implantable Lead Location: 753859
Implantable Lead Location: 753860
Implantable Pulse Generator Implant Date: 20200129
Lead Channel Impedance Value: 1125 Ohm
Lead Channel Impedance Value: 380 Ohm
Lead Channel Impedance Value: 410 Ohm
Lead Channel Pacing Threshold Amplitude: 0.75 V
Lead Channel Pacing Threshold Amplitude: 0.75 V
Lead Channel Pacing Threshold Amplitude: 2.375 V
Lead Channel Pacing Threshold Pulse Width: 0.5 ms
Lead Channel Pacing Threshold Pulse Width: 0.5 ms
Lead Channel Pacing Threshold Pulse Width: 1 ms
Lead Channel Sensing Intrinsic Amplitude: 12 mV
Lead Channel Sensing Intrinsic Amplitude: 4.1 mV
Lead Channel Setting Pacing Amplitude: 2 V
Lead Channel Setting Pacing Amplitude: 2 V
Lead Channel Setting Pacing Amplitude: 2.875
Lead Channel Setting Pacing Pulse Width: 0.5 ms
Lead Channel Setting Pacing Pulse Width: 1 ms
Lead Channel Setting Sensing Sensitivity: 0.5 mV
Pulse Gen Serial Number: 9877212

## 2020-10-04 DIAGNOSIS — L0291 Cutaneous abscess, unspecified: Secondary | ICD-10-CM | POA: Diagnosis not present

## 2020-10-04 DIAGNOSIS — L02212 Cutaneous abscess of back [any part, except buttock]: Secondary | ICD-10-CM | POA: Diagnosis not present

## 2020-10-06 DIAGNOSIS — Z4800 Encounter for change or removal of nonsurgical wound dressing: Secondary | ICD-10-CM | POA: Diagnosis not present

## 2020-10-08 DIAGNOSIS — Z4800 Encounter for change or removal of nonsurgical wound dressing: Secondary | ICD-10-CM | POA: Diagnosis not present

## 2020-10-18 NOTE — Progress Notes (Signed)
Remote ICD transmission.   

## 2020-10-22 ENCOUNTER — Ambulatory Visit (INDEPENDENT_AMBULATORY_CARE_PROVIDER_SITE_OTHER): Payer: Medicare Other

## 2020-10-22 DIAGNOSIS — I5022 Chronic systolic (congestive) heart failure: Secondary | ICD-10-CM | POA: Diagnosis not present

## 2020-10-22 DIAGNOSIS — Z95810 Presence of automatic (implantable) cardiac defibrillator: Secondary | ICD-10-CM | POA: Diagnosis not present

## 2020-10-22 NOTE — Progress Notes (Signed)
EPIC Encounter for ICM Monitoring  Patient Name: Jonathan Floyd is a 75 y.o. male Date: 10/22/2020 Primary Care Physican: [facility], [place] Primary Cardiologist:Branch Electrophysiologist:Allred Bi-V Pacing:>99% 10/22/2020 Office Weight: 227lbs  AT/AF Burden <1%(taking aspirin)  Spoke with patient and reports feeling well at this time. Heart failure questions reviewed. Pt asymptomatic.  CorvueThoracic impedancetrending close to normal.  Prescribed:   Torsemide20 mg take 1 tablet by mouth daily as needed (for fluid).   Potassium & Sodium Phosphates (PHOS-NAK)280-160-250 mg Pack Take 1 packet by mouth 2 (two) times daily.  Labs: 09/07/2019 Creatinine 1.90, BUN 26, Potassium 3.6, Sodium 137, GFR 35 A complete set of results can be found in Results Review.  Recommendations: Pt plans on taking PRN Furosemide x 1 day.      Follow-up plan: ICM clinic phone appointment on7/03/2021. 91 day device clinic remote transmission8/01/2021.  EP/Cardiology Office Visits:01/31/2021 with Dr Harl Bowie.   Copy of ICM check sent to Dr.Allred.   Direct Trend View 10/22/2020    3 month ICM trend: 10/22/2020.    1 Year ICM trend:       Rosalene Billings, RN 10/22/2020 11:35 AM

## 2020-11-26 ENCOUNTER — Ambulatory Visit (INDEPENDENT_AMBULATORY_CARE_PROVIDER_SITE_OTHER): Payer: Medicare Other

## 2020-11-26 DIAGNOSIS — I5022 Chronic systolic (congestive) heart failure: Secondary | ICD-10-CM | POA: Diagnosis not present

## 2020-11-26 DIAGNOSIS — Z95810 Presence of automatic (implantable) cardiac defibrillator: Secondary | ICD-10-CM

## 2020-11-29 ENCOUNTER — Telehealth: Payer: Self-pay

## 2020-11-29 NOTE — Telephone Encounter (Signed)
LMOVM for patient to send missed ICM transmission. 

## 2020-11-30 ENCOUNTER — Telehealth: Payer: Self-pay

## 2020-11-30 NOTE — Progress Notes (Signed)
EPIC Encounter for ICM Monitoring  Patient Name: Jonathan Floyd is a 74 y.o. male Date: 11/30/2020 Primary Care Physican: [facility], [place] Primary Cardiologist: Branch Electrophysiologist: Allred Bi-V Pacing: >99%          10/22/2020 Office Weight: 227 lbs   AT/AF Burden <1% (taking aspirin)                                          Attempted call to patient and unable to reach. Transmission reviewed.    Corvue Thoracic impedance suggesting possible fluid accumulation starting 11/25/2020.     Prescribed: Torsemide 20 mg take 1 tablet by mouth daily as needed (for fluid).   Potassium & Sodium Phosphates (PHOS-NAK)280-160-250 mg Pack Take 1 packet by mouth 2 (two) times daily.   Labs: 09/07/2019 Creatinine 1.90, BUN 26, Potassium 3.6, Sodium 137, GFR 35 A complete set of results can be found in Results Review.   Recommendations:  Unable to reach.  Will advise to take PRN Furosemide if reached   Follow-up plan: ICM clinic phone appointment on 12/10/2020 to recheck fluid levels.   91 day device clinic remote transmission 12/25/2020.    EP/Cardiology Office Visits:  01/31/2021 with Dr Harl Bowie.     Copy of ICM check sent to Dr. Rayann Heman.   3 month ICM trend: 11/26/2020.    1 Year ICM trend:       Rosalene Billings, RN 11/30/2020 3:54 PM

## 2020-11-30 NOTE — Telephone Encounter (Signed)
Remote ICM transmission received.  Attempted call to patient regarding ICM remote transmission and person answering phone stated patient was not home

## 2020-12-10 ENCOUNTER — Ambulatory Visit (INDEPENDENT_AMBULATORY_CARE_PROVIDER_SITE_OTHER): Payer: No Typology Code available for payment source

## 2020-12-10 DIAGNOSIS — Z95810 Presence of automatic (implantable) cardiac defibrillator: Secondary | ICD-10-CM

## 2020-12-10 DIAGNOSIS — I5022 Chronic systolic (congestive) heart failure: Secondary | ICD-10-CM

## 2020-12-11 NOTE — Progress Notes (Signed)
EPIC Encounter for ICM Monitoring  Patient Name: Jonathan Floyd is a 75 y.o. male Date: 12/11/2020 Primary Care Physican: [facility], [place] Primary Cardiologist: Branch Electrophysiologist: Allred Bi-V Pacing: >99%          10/22/2020 Office Weight: 227 lbs   AT/AF Burden <1% (taking aspirin)                                          Transmission reviewed.    Corvue Thoracic impedance suggesting fluid levels returned to normal.     Prescribed: Torsemide 20 mg take 1 tablet by mouth daily as needed (for fluid).   Potassium & Sodium Phosphates (PHOS-NAK)280-160-250 mg Pack Take 1 packet by mouth 2 (two) times daily.   Labs: 09/07/2019 Creatinine 1.90, BUN 26, Potassium 3.6, Sodium 137, GFR 35 A complete set of results can be found in Results Review.   Recommendations:No changes   Follow-up plan: ICM clinic phone appointment on 01/07/2021.   91 day device clinic remote transmission 12/25/2020.    EP/Cardiology Office Visits:  01/31/2021 with Dr Harl Bowie.     Copy of ICM check sent to Dr. Rayann Heman.   3 month ICM trend: 12/10/2020.    1 Year ICM trend:       Rosalene Billings, RN 12/11/2020 3:40 PM

## 2020-12-25 ENCOUNTER — Ambulatory Visit (INDEPENDENT_AMBULATORY_CARE_PROVIDER_SITE_OTHER): Payer: Medicare Other

## 2020-12-25 DIAGNOSIS — I429 Cardiomyopathy, unspecified: Secondary | ICD-10-CM

## 2020-12-25 LAB — CUP PACEART REMOTE DEVICE CHECK
Battery Remaining Longevity: 47 mo
Battery Remaining Percentage: 65 %
Battery Voltage: 2.96 V
Brady Statistic AP VP Percent: 4.5 %
Brady Statistic AP VS Percent: 1 %
Brady Statistic AS VP Percent: 95 %
Brady Statistic AS VS Percent: 1 %
Brady Statistic RA Percent Paced: 4 %
Date Time Interrogation Session: 20220809020019
HighPow Impedance: 71 Ohm
HighPow Impedance: 71 Ohm
Implantable Lead Implant Date: 20200129
Implantable Lead Implant Date: 20200129
Implantable Lead Implant Date: 20200129
Implantable Lead Location: 753858
Implantable Lead Location: 753859
Implantable Lead Location: 753860
Implantable Pulse Generator Implant Date: 20200129
Lead Channel Impedance Value: 1100 Ohm
Lead Channel Impedance Value: 360 Ohm
Lead Channel Impedance Value: 380 Ohm
Lead Channel Pacing Threshold Amplitude: 0.625 V
Lead Channel Pacing Threshold Amplitude: 0.75 V
Lead Channel Pacing Threshold Amplitude: 2.25 V
Lead Channel Pacing Threshold Pulse Width: 0.5 ms
Lead Channel Pacing Threshold Pulse Width: 0.5 ms
Lead Channel Pacing Threshold Pulse Width: 1 ms
Lead Channel Sensing Intrinsic Amplitude: 11.4 mV
Lead Channel Sensing Intrinsic Amplitude: 4.4 mV
Lead Channel Setting Pacing Amplitude: 2 V
Lead Channel Setting Pacing Amplitude: 2 V
Lead Channel Setting Pacing Amplitude: 2.75 V
Lead Channel Setting Pacing Pulse Width: 0.5 ms
Lead Channel Setting Pacing Pulse Width: 1 ms
Lead Channel Setting Sensing Sensitivity: 0.5 mV
Pulse Gen Serial Number: 9877212

## 2021-01-07 ENCOUNTER — Ambulatory Visit (INDEPENDENT_AMBULATORY_CARE_PROVIDER_SITE_OTHER): Payer: Medicare Other

## 2021-01-07 DIAGNOSIS — I5022 Chronic systolic (congestive) heart failure: Secondary | ICD-10-CM

## 2021-01-07 DIAGNOSIS — Z95810 Presence of automatic (implantable) cardiac defibrillator: Secondary | ICD-10-CM | POA: Diagnosis not present

## 2021-01-09 NOTE — Progress Notes (Signed)
EPIC Encounter for ICM Monitoring  Patient Name: Jonathan Floyd is a 75 y.o. male Date: 01/09/2021 Primary Care Physican: [facility], [place] Primary Cardiologist: Branch Electrophysiologist: Allred Bi-V Pacing: >99%          01/09/2021 Weight: 229 lbs   AT/AF Burden <1% (taking aspirin)                                          Spoke with patient and heart failure questions reviewed.  Pt reports SOB and head feels funny during decreased impedance.  He took PRN Torsemide 8/10 with relief of symptoms.  Reviewed symptoms of dehydration also.    Corvue Thoracic impedance normal but was suggesting dryness from 8/17-8/21 and possible fluid accumulation from 7/25-8/10.   Prescribed: Torsemide 20 mg take 1 tablet by mouth daily as needed (for fluid).   Potassium & Sodium Phosphates (PHOS-NAK)280-160-250 mg Pack Take 1 packet by mouth 2 (two) times daily.   Labs: 09/07/2019 Creatinine 1.90, BUN 26, Potassium 3.6, Sodium 137, GFR 35 A complete set of results can be found in Results Review.   Recommendations:  Discussed limiting fluid intake to 64 oz daily.  Encouraged to call if experiencing any fluid symptoms.    Follow-up plan: ICM clinic phone appointment on 02/18/2021.   91 day device clinic remote transmission 03/28/2021.    EP/Cardiology Office Visits:  02/19/2021 with Dr Harl Bowie.     Copy of ICM check sent to Dr. Rayann Heman.    3 month ICM trend: 01/07/2021.    1 Year ICM trend:       Rosalene Billings, RN 01/09/2021 4:31 PM

## 2021-01-17 NOTE — Progress Notes (Signed)
Remote ICD transmission.   

## 2021-01-31 ENCOUNTER — Ambulatory Visit: Payer: No Typology Code available for payment source | Admitting: Cardiology

## 2021-02-18 ENCOUNTER — Ambulatory Visit (INDEPENDENT_AMBULATORY_CARE_PROVIDER_SITE_OTHER): Payer: Medicare Other

## 2021-02-18 ENCOUNTER — Telehealth: Payer: Self-pay

## 2021-02-18 DIAGNOSIS — I5022 Chronic systolic (congestive) heart failure: Secondary | ICD-10-CM

## 2021-02-18 DIAGNOSIS — Z95810 Presence of automatic (implantable) cardiac defibrillator: Secondary | ICD-10-CM

## 2021-02-18 NOTE — Progress Notes (Signed)
EPIC Encounter for ICM Monitoring  Patient Name: Jonathan Floyd is a 75 y.o. male Date: 02/18/2021 Primary Care Physican: [facility], [place] Primary Cardiologist: Branch Electrophysiologist: Allred Bi-V Pacing: >99%          01/09/2021 Weight: 229 lbs   AT/AF Burden <1% (taking aspirin)                                          Attempted call to patient and unable to reach.  Left detailed message per DPR regarding transmission. Transmission reviewed.    Corvue Thoracic impedance suggesting dryness since 10/1 and possible fluid accumulation from 9/13-9/21.   Prescribed: Torsemide 20 mg take 1 tablet by mouth daily as needed (for fluid).   Potassium & Sodium Phosphates (PHOS-NAK)280-160-250 mg Pack Take 1 packet by mouth 2 (two) times daily.   Labs: 09/07/2019 Creatinine 1.90, BUN 26, Potassium 3.6, Sodium 137, GFR 35 A complete set of results can be found in Results Review.   Recommendations:  Left voice mail with ICM number and encouraged to call if experiencing any fluid symptoms.   Follow-up plan: ICM clinic phone appointment on 03/27/2021.   91 day device clinic remote transmission 03/26/2021.    EP/Cardiology Office Visits:  02/19/2021 with Dr Harl Bowie.     Copy of ICM check sent to Dr. Rayann Heman.    3 month ICM trend: 02/18/2021.    1 Year ICM trend:     AT/AF    Rosalene Billings, RN 02/18/2021 12:42 PM

## 2021-02-18 NOTE — Telephone Encounter (Signed)
Remote ICM transmission received.  Attempted call to patient regarding ICM remote transmission and left detailed message per DPR.  Advised to return call for any fluid symptoms or questions. Next ICM remote transmission scheduled 03/27/2021.

## 2021-02-19 ENCOUNTER — Encounter: Payer: Self-pay | Admitting: Cardiology

## 2021-02-19 ENCOUNTER — Ambulatory Visit (INDEPENDENT_AMBULATORY_CARE_PROVIDER_SITE_OTHER): Payer: Medicare Other | Admitting: Cardiology

## 2021-02-19 VITALS — BP 132/70 | HR 84 | Ht 69.0 in | Wt 209.8 lb

## 2021-02-19 DIAGNOSIS — I10 Essential (primary) hypertension: Secondary | ICD-10-CM

## 2021-02-19 DIAGNOSIS — E782 Mixed hyperlipidemia: Secondary | ICD-10-CM | POA: Diagnosis not present

## 2021-02-19 DIAGNOSIS — I2510 Atherosclerotic heart disease of native coronary artery without angina pectoris: Secondary | ICD-10-CM | POA: Diagnosis not present

## 2021-02-19 DIAGNOSIS — I5022 Chronic systolic (congestive) heart failure: Secondary | ICD-10-CM

## 2021-02-19 NOTE — Patient Instructions (Addendum)

## 2021-02-19 NOTE — Progress Notes (Signed)
Clinical Summary Mr. Flahive is a 75 y.o.male seen today for follow up of the following medical problems.      1. Chronic systolic HF - echo 06/5425 LVEF 06-23%, grade I diastolic dysfunction   -Jan 29/2020 had BiV AICD placed.  02/2019 echo: LVEF 50-55%, grade I DDX,      - chronic SOB that is intermittent - no recent edema. Takes toresmide prn, takes about 2 times a month - 12/2020 normal device check   2. History of CAD - history of prior cardiac stents done through [place]. Last in 2017.  - he has a stent card: Nov 2017 xience to prox LAD - EKG with chronic LBBB   -02/2018 nuclear stress large scar as reported below, no significant ischemia.    - denies any recent chest pains    3. HTN - he is compliant with meds     4. Hyperlipidemia - labs followed by [facility]   02/2019 LDL 74 - compliant with meds   5. CKD III - history of nephrectomy.  Followed by  nephrology Dr Theador Hawthorne           SH: [employer]     Past Medical History:  Diagnosis Date   AICD (automatic cardioverter/defibrillator) present 06/16/2018   Cancer ([place])    43 years ago- kidney taken out for it   Chronic systolic CHF (congestive heart failure) ([place])    CKD (chronic kidney disease), stage III ([place])    1 kidney   Coronary artery disease 2017   DES pLAD, [place]. 2 other stents in 2014   Diabetes mellitus without complication ([place])    Hypertension    Renal insufficiency      Allergies  Allergen Reactions   Iodinated Diagnostic Agents Other (See Comments)    Kidney problems   Iodine Other (See Comments)    Only has one kidney     Current Outpatient Medications  Medication Sig Dispense Refill   acetaminophen (TYLENOL) 500 MG tablet Take 1,000 mg by mouth every 6 (six) hours as needed for mild pain or headache.      aspirin EC 81 MG tablet Take 81 mg by mouth daily.     atorvastatin (LIPITOR) 10 MG tablet Take 10 mg by mouth at bedtime.      carvedilol (COREG)  25 MG tablet Take 1 tablet (25 mg total) by mouth 2 (two) times daily. 180 tablet 3   finasteride (PROSCAR) 5 MG tablet Take 5 mg by mouth daily.     hydrALAZINE (APRESOLINE) 50 MG tablet Take 1 tablet (50 mg total) by mouth 3 (three) times daily. 270 tablet 1   insulin glargine (LANTUS) 100 UNIT/ML injection Inject 30 Units into the skin at bedtime.     isosorbide dinitrate (ISORDIL) 10 MG tablet Take 30 mg by mouth 3 (three) times daily.      nitroGLYCERIN (NITROSTAT) 0.4 MG SL tablet Place 0.4 mg under the tongue every 5 (five) minutes as needed for chest pain.     potassium & sodium phosphates (PHOS-NAK) 280-160-250 MG PACK Take 1 packet by mouth 2 (two) times daily.     torsemide (DEMADEX) 20 MG tablet Take 20 mg by mouth daily as needed (for fluid).      No current facility-administered medications for this visit.     Past Surgical History:  Procedure Laterality Date   BIV ICD INSERTION CRT-D N/A 06/16/2018   Procedure: BIV ICD INSERTION CRT-D;  Surgeon: Constance Haw, MD;  Location: [place] [facility];  Service: Cardiovascular;  Laterality: N/A;   CORONARY ANGIOPLASTY WITH STENT PLACEMENT     ICD IMPLANT  03/17/2019   BIV   KNEE ARTHROPLASTY     NEPHRECTOMY  1976     Allergies  Allergen Reactions   Iodinated Diagnostic Agents Other (See Comments)    Kidney problems   Iodine Other (See Comments)    Only has one kidney      Family History  Problem Relation Age of Onset   Dementia Mother    Diabetes Mother    Heart attack Father    Heart disease Father    Hypertension Father      Social History Mr. Selner reports that he quit smoking about 33 years ago. He has never used smokeless tobacco. Mr. Mcphillips reports current alcohol use.   Review of Systems CONSTITUTIONAL: No weight loss, fever, chills, weakness or fatigue.  HEENT: Eyes: No visual loss, blurred vision, double vision or yellow sclerae.No hearing loss, sneezing, congestion, runny nose or sore  throat.  SKIN: No rash or itching.  CARDIOVASCULAR: per hpi RESPIRATORY: No shortness of breath, cough or sputum.  GASTROINTESTINAL: No anorexia, nausea, vomiting or diarrhea. No abdominal pain or blood.  GENITOURINARY: No burning on urination, no polyuria NEUROLOGICAL: No headache, dizziness, syncope, paralysis, ataxia, numbness or tingling in the extremities. No change in bowel or bladder control.  MUSCULOSKELETAL: No muscle, back pain, joint pain or stiffness.  LYMPHATICS: No enlarged nodes. No history of splenectomy.  PSYCHIATRIC: No history of depression or anxiety.  ENDOCRINOLOGIC: No reports of sweating, cold or heat intolerance. No polyuria or polydipsia.  Marland Kitchen   Physical Examination Today's Vitals   02/19/21 0901  BP: 132/70  Pulse: 84  SpO2: 98%  Weight: 209 lb 12.8 oz (95.2 kg)  Height: 5\' 9"  (1.753 m)   Body mass index is 30.98 kg/m.  Gen: resting comfortably, no acute distress HEENT: no scleral icterus, pupils equal round and reactive, no palptable cervical adenopathy,  CV: RRR, no m/r/g no jvd Resp: Clear to auscultation bilaterally GI: abdomen is soft, non-tender, non-distended, normal bowel sounds, no hepatosplenomegaly MSK: extremities are warm, no edema.  Skin: warm, no rash Neuro:  no focal deficits Psych: appropriate affect   Diagnostic Studies  02/2018 nuclear stress Defect 1: There is a large defect of moderate severity present in the basal inferoseptal, basal inferior, mid anteroseptal, mid inferoseptal, mid inferior, apical septal and apical inferior location. Findings consistent with prior myocardial infarction. No significant ischemic zones. This is a high risk study based on degree of scar and significantly depressed LVEF. Nuclear stress EF: 31%.       11/2017 echo Study Conclusions   - Left ventricle: The cavity size was mildly dilated. Wall   thickness was increased in a pattern of moderate LVH. Systolic   function was severely reduced. The  estimated ejection fraction   was in the range of 20% to 25%. Diffuse hypokinesis. Doppler   parameters are consistent with abnormal left ventricular   relaxation (grade 1 diastolic dysfunction). Doppler parameters   are consistent with high ventricular filling pressure. - Aortic valve: There was mild regurgitation. - Mitral valve: There was mild regurgitation. - Left atrium: The atrium was mildly dilated. - Pulmonary arteries: Systolic pressure was mildly increased. PA   peak pressure: 39 mm Hg (S).   Impressions:   - Severe global reduction in LV systolic function; moderate LVH;   mild LVE;  mild diastolic dysfunction; mild AI and MR; mild LAE;   trace TR with mild pulmonary hypertension.   04/2018 echo Study Conclusions   - Left ventricle: The cavity size was normal. Wall thickness was   increased in a pattern of mild LVH. Systolic function was   severely reduced. The estimated ejection fraction was in the   range of 20% to 25%. Diffuse hypokinesis. Doppler parameters are   consistent with abnormal left ventricular relaxation (grade 1   diastolic dysfunction). Doppler parameters are consistent with   indeterminate ventricular filling pressure. - Regional wall motion abnormality: Akinesis of the basal-mid   anteroseptal myocardium. - Aortic valve: Trileaflet; mildly thickened, mildly calcified   leaflets. There was mild stenosis (low output, low gradient).   There was mild regurgitation. Valve area (VTI): 1.72 cm^2. Valve   area (Vmax): 1.69 cm^2. Valve area (Vmean): 1.66 cm^2. - Mitral valve: Mildly calcified annulus. Mildly thickened leaflets   . There was moderate regurgitation. - Left atrium: The atrium was moderately dilated. - Tricuspid valve: There was mild-moderate regurgitation. - Pulmonary arteries: PA peak pressure: 36 mm Hg (S).     02/2019 echo IMPRESSIONS     1. Left ventricular ejection fraction, by visual estimation, is 50 to  55%. The left ventricle has  normal function. Normal left ventricular size.  There is severely increased left ventricular hypertrophy.   2. Left ventricular diastolic Doppler parameters are consistent with  impaired relaxation pattern of LV diastolic filling.   3. Global right ventricle has normal systolic function.The right  ventricular size is normal. Moderately increased right ventricular wall  thickness.   4. Left atrial size was normal.   5. Right atrial size was normal.   6. Mild mitral annular calcification.   7. The mitral valve is degenerative. Mild mitral valve regurgitation.   8. The tricuspid valve is grossly normal. Tricuspid valve regurgitation  is trivial.   9. The aortic valve is tricuspid Aortic valve regurgitation is mild by  color flow Doppler. Mild aortic valve stenosis.  10. The pulmonic valve was grossly normal. Pulmonic valve regurgitation is  not visualized by color flow Doppler.  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  12. Aortic dilatation noted.  13. Normal pulmonary artery systolic pressure.  14. The inferior vena cava is normal in size with greater than 50%  respiratory variability, suggesting right atrial pressure of 3 mmHg.      Assessment and Plan  1. Chronic systolic HF/CAD - medical therapy limited by renal dysfunction, not on ACEI/ARB/aldactone/arni - most recent echo shows LVEF has normalized -no symptoms, continue current meds   2. CAD -no symptoms, continue current meds   3. HTN -bp at goal, continue current meds  4. Hyperlipidemia - continue atorvastatin, request pcp labs.      Arnoldo Lenis, M.D.

## 2021-02-21 ENCOUNTER — Encounter: Payer: Self-pay | Admitting: *Deleted

## 2021-03-20 DIAGNOSIS — R2689 Other abnormalities of gait and mobility: Secondary | ICD-10-CM | POA: Diagnosis not present

## 2021-03-20 DIAGNOSIS — R531 Weakness: Secondary | ICD-10-CM | POA: Diagnosis not present

## 2021-03-21 ENCOUNTER — Other Ambulatory Visit (HOSPITAL_COMMUNITY): Payer: Self-pay | Admitting: Internal Medicine

## 2021-03-21 ENCOUNTER — Other Ambulatory Visit: Payer: Self-pay | Admitting: Internal Medicine

## 2021-03-21 ENCOUNTER — Telehealth: Payer: Self-pay | Admitting: Internal Medicine

## 2021-03-21 DIAGNOSIS — I639 Cerebral infarction, unspecified: Secondary | ICD-10-CM | POA: Diagnosis not present

## 2021-03-21 DIAGNOSIS — R531 Weakness: Secondary | ICD-10-CM | POA: Diagnosis not present

## 2021-03-21 DIAGNOSIS — I130 Hypertensive heart and chronic kidney disease with heart failure and stage 1 through stage 4 chronic kidney disease, or unspecified chronic kidney disease: Secondary | ICD-10-CM | POA: Diagnosis not present

## 2021-03-21 DIAGNOSIS — I10 Essential (primary) hypertension: Secondary | ICD-10-CM | POA: Diagnosis not present

## 2021-03-21 DIAGNOSIS — Z794 Long term (current) use of insulin: Secondary | ICD-10-CM | POA: Diagnosis not present

## 2021-03-21 DIAGNOSIS — I5022 Chronic systolic (congestive) heart failure: Secondary | ICD-10-CM | POA: Diagnosis not present

## 2021-03-21 DIAGNOSIS — E119 Type 2 diabetes mellitus without complications: Secondary | ICD-10-CM | POA: Diagnosis not present

## 2021-03-21 DIAGNOSIS — Z79899 Other long term (current) drug therapy: Secondary | ICD-10-CM | POA: Diagnosis not present

## 2021-03-21 DIAGNOSIS — W19XXXA Unspecified fall, initial encounter: Secondary | ICD-10-CM | POA: Diagnosis not present

## 2021-03-21 DIAGNOSIS — I739 Peripheral vascular disease, unspecified: Secondary | ICD-10-CM | POA: Diagnosis not present

## 2021-03-21 DIAGNOSIS — G8194 Hemiplegia, unspecified affecting left nondominant side: Secondary | ICD-10-CM | POA: Diagnosis not present

## 2021-03-21 DIAGNOSIS — Z20822 Contact with and (suspected) exposure to covid-19: Secondary | ICD-10-CM | POA: Diagnosis not present

## 2021-03-21 DIAGNOSIS — G319 Degenerative disease of nervous system, unspecified: Secondary | ICD-10-CM | POA: Diagnosis not present

## 2021-03-21 DIAGNOSIS — E1122 Type 2 diabetes mellitus with diabetic chronic kidney disease: Secondary | ICD-10-CM | POA: Diagnosis not present

## 2021-03-21 DIAGNOSIS — Z7982 Long term (current) use of aspirin: Secondary | ICD-10-CM | POA: Diagnosis not present

## 2021-03-21 NOTE — Telephone Encounter (Signed)
Calling to see if the patient can get MRI done with the heart monitor that his has. They think he might have small blood clot on this brain. Please advise

## 2021-03-21 NOTE — Telephone Encounter (Signed)
Lm to call back ./cy 

## 2021-03-21 NOTE — Telephone Encounter (Signed)
Pt wife and daughter called back regarding patient device.    Advised that his device is MRI compatible.  Provided device clinic fax # for [facility] [place] to send form to .

## 2021-03-22 ENCOUNTER — Other Ambulatory Visit: Payer: Self-pay

## 2021-03-22 ENCOUNTER — Ambulatory Visit (HOSPITAL_COMMUNITY)
Admission: RE | Admit: 2021-03-22 | Discharge: 2021-03-22 | Disposition: A | Discharge status: Home / Self Care | Payer: Medicare Other | Source: Ambulatory Visit | Attending: Internal Medicine | Admitting: Internal Medicine

## 2021-03-22 ENCOUNTER — Other Ambulatory Visit: Payer: Self-pay | Admitting: Internal Medicine

## 2021-03-22 ENCOUNTER — Other Ambulatory Visit (HOSPITAL_COMMUNITY): Payer: Self-pay | Admitting: Internal Medicine

## 2021-03-22 DIAGNOSIS — I110 Hypertensive heart disease with heart failure: Secondary | ICD-10-CM | POA: Diagnosis not present

## 2021-03-22 DIAGNOSIS — I63322 Cerebral infarction due to thrombosis of left anterior cerebral artery: Secondary | ICD-10-CM | POA: Diagnosis not present

## 2021-03-22 DIAGNOSIS — R600 Localized edema: Secondary | ICD-10-CM | POA: Diagnosis not present

## 2021-03-22 DIAGNOSIS — I69354 Hemiplegia and hemiparesis following cerebral infarction affecting left non-dominant side: Secondary | ICD-10-CM | POA: Diagnosis not present

## 2021-03-22 DIAGNOSIS — I350 Nonrheumatic aortic (valve) stenosis: Secondary | ICD-10-CM | POA: Diagnosis present

## 2021-03-22 DIAGNOSIS — R509 Fever, unspecified: Secondary | ICD-10-CM | POA: Diagnosis not present

## 2021-03-22 DIAGNOSIS — I5022 Chronic systolic (congestive) heart failure: Secondary | ICD-10-CM | POA: Diagnosis present

## 2021-03-22 DIAGNOSIS — I639 Cerebral infarction, unspecified: Secondary | ICD-10-CM

## 2021-03-22 DIAGNOSIS — G319 Degenerative disease of nervous system, unspecified: Secondary | ICD-10-CM | POA: Diagnosis not present

## 2021-03-22 DIAGNOSIS — Z91041 Radiographic dye allergy status: Secondary | ICD-10-CM | POA: Diagnosis not present

## 2021-03-22 DIAGNOSIS — E785 Hyperlipidemia, unspecified: Secondary | ICD-10-CM | POA: Diagnosis not present

## 2021-03-22 DIAGNOSIS — R531 Weakness: Secondary | ICD-10-CM | POA: Diagnosis not present

## 2021-03-22 DIAGNOSIS — I517 Cardiomegaly: Secondary | ICD-10-CM | POA: Diagnosis not present

## 2021-03-22 DIAGNOSIS — Z85528 Personal history of other malignant neoplasm of kidney: Secondary | ICD-10-CM | POA: Diagnosis not present

## 2021-03-22 DIAGNOSIS — I2510 Atherosclerotic heart disease of native coronary artery without angina pectoris: Secondary | ICD-10-CM | POA: Diagnosis present

## 2021-03-22 DIAGNOSIS — Z905 Acquired absence of kidney: Secondary | ICD-10-CM | POA: Diagnosis not present

## 2021-03-22 DIAGNOSIS — E7800 Pure hypercholesterolemia, unspecified: Secondary | ICD-10-CM | POA: Diagnosis present

## 2021-03-22 DIAGNOSIS — Z20822 Contact with and (suspected) exposure to covid-19: Secondary | ICD-10-CM | POA: Diagnosis present

## 2021-03-22 DIAGNOSIS — Z794 Long term (current) use of insulin: Secondary | ICD-10-CM | POA: Diagnosis not present

## 2021-03-22 DIAGNOSIS — Z7982 Long term (current) use of aspirin: Secondary | ICD-10-CM | POA: Diagnosis not present

## 2021-03-22 DIAGNOSIS — I6381 Other cerebral infarction due to occlusion or stenosis of small artery: Secondary | ICD-10-CM | POA: Diagnosis not present

## 2021-03-22 DIAGNOSIS — W19XXXA Unspecified fall, initial encounter: Secondary | ICD-10-CM | POA: Diagnosis not present

## 2021-03-22 DIAGNOSIS — Z79899 Other long term (current) drug therapy: Secondary | ICD-10-CM | POA: Diagnosis not present

## 2021-03-22 DIAGNOSIS — Z95810 Presence of automatic (implantable) cardiac defibrillator: Secondary | ICD-10-CM | POA: Diagnosis not present

## 2021-03-22 DIAGNOSIS — G4733 Obstructive sleep apnea (adult) (pediatric): Secondary | ICD-10-CM | POA: Diagnosis present

## 2021-03-22 DIAGNOSIS — Z7984 Long term (current) use of oral hypoglycemic drugs: Secondary | ICD-10-CM | POA: Diagnosis not present

## 2021-03-22 DIAGNOSIS — Z87891 Personal history of nicotine dependence: Secondary | ICD-10-CM | POA: Diagnosis not present

## 2021-03-22 DIAGNOSIS — R29818 Other symptoms and signs involving the nervous system: Secondary | ICD-10-CM | POA: Diagnosis not present

## 2021-03-22 DIAGNOSIS — R4182 Altered mental status, unspecified: Secondary | ICD-10-CM | POA: Diagnosis not present

## 2021-03-22 DIAGNOSIS — G8194 Hemiplegia, unspecified affecting left nondominant side: Secondary | ICD-10-CM | POA: Diagnosis present

## 2021-03-22 DIAGNOSIS — Z9989 Dependence on other enabling machines and devices: Secondary | ICD-10-CM | POA: Diagnosis not present

## 2021-03-22 DIAGNOSIS — E119 Type 2 diabetes mellitus without complications: Secondary | ICD-10-CM | POA: Diagnosis not present

## 2021-03-22 DIAGNOSIS — I5042 Chronic combined systolic (congestive) and diastolic (congestive) heart failure: Secondary | ICD-10-CM | POA: Diagnosis not present

## 2021-03-22 DIAGNOSIS — R29723 NIHSS score 23: Secondary | ICD-10-CM | POA: Diagnosis present

## 2021-03-22 DIAGNOSIS — E1122 Type 2 diabetes mellitus with diabetic chronic kidney disease: Secondary | ICD-10-CM | POA: Diagnosis present

## 2021-03-22 DIAGNOSIS — I130 Hypertensive heart and chronic kidney disease with heart failure and stage 1 through stage 4 chronic kidney disease, or unspecified chronic kidney disease: Secondary | ICD-10-CM | POA: Diagnosis present

## 2021-03-22 DIAGNOSIS — N189 Chronic kidney disease, unspecified: Secondary | ICD-10-CM | POA: Diagnosis present

## 2021-03-22 DIAGNOSIS — Z8673 Personal history of transient ischemic attack (TIA), and cerebral infarction without residual deficits: Secondary | ICD-10-CM | POA: Diagnosis not present

## 2021-03-22 DIAGNOSIS — Z91013 Allergy to seafood: Secondary | ICD-10-CM | POA: Diagnosis not present

## 2021-03-22 DIAGNOSIS — C649 Malignant neoplasm of unspecified kidney, except renal pelvis: Secondary | ICD-10-CM | POA: Diagnosis not present

## 2021-03-22 DIAGNOSIS — I10 Essential (primary) hypertension: Secondary | ICD-10-CM | POA: Diagnosis not present

## 2021-03-22 DIAGNOSIS — W19XXXD Unspecified fall, subsequent encounter: Secondary | ICD-10-CM | POA: Diagnosis not present

## 2021-03-22 NOTE — Progress Notes (Signed)
Patient here today at [facility] from [facility] via carelink transfer for MRI brain wo contrast. Patient has [employer] Device. Unable to send transmission however patient programmed with Lara-rep via phone. Orders for DOO 95. Andy-cardiology PA notified.

## 2021-03-22 NOTE — Progress Notes (Incomplete)
Pt brought to MRI via Carelink. Pt's pacemaker device interrogated and programmed by RN. Pt safety screened via family member. Prepared pt and began scanning. Struggled to obtain diagnostic images. Significant motion degradation. Multiple attempts to verbally redirect pt to no avail. Pt began reaching arms outside of the scanner. Unsafe to continue scanning. Able to obtain limited study- AX DWI, COR DWI, SAG T1, AX T2 FLAIR, AX MPGR. Pt removed from scanner and transferred back to stretcher for transport.

## 2021-03-26 DIAGNOSIS — I6603 Occlusion and stenosis of bilateral middle cerebral arteries: Secondary | ICD-10-CM | POA: Diagnosis not present

## 2021-03-26 DIAGNOSIS — I63523 Cerebral infarction due to unspecified occlusion or stenosis of bilateral anterior cerebral arteries: Secondary | ICD-10-CM | POA: Diagnosis not present

## 2021-03-26 DIAGNOSIS — I69354 Hemiplegia and hemiparesis following cerebral infarction affecting left non-dominant side: Secondary | ICD-10-CM | POA: Diagnosis not present

## 2021-03-26 DIAGNOSIS — I6613 Occlusion and stenosis of bilateral anterior cerebral arteries: Secondary | ICD-10-CM | POA: Diagnosis not present

## 2021-03-26 DIAGNOSIS — I6623 Occlusion and stenosis of bilateral posterior cerebral arteries: Secondary | ICD-10-CM | POA: Diagnosis not present

## 2021-03-26 DIAGNOSIS — I639 Cerebral infarction, unspecified: Secondary | ICD-10-CM | POA: Diagnosis not present

## 2021-03-26 DIAGNOSIS — R569 Unspecified convulsions: Secondary | ICD-10-CM | POA: Diagnosis not present

## 2021-03-26 DIAGNOSIS — G319 Degenerative disease of nervous system, unspecified: Secondary | ICD-10-CM | POA: Diagnosis not present

## 2021-03-26 DIAGNOSIS — I6782 Cerebral ischemia: Secondary | ICD-10-CM | POA: Diagnosis not present

## 2021-03-26 DIAGNOSIS — G9340 Encephalopathy, unspecified: Secondary | ICD-10-CM | POA: Diagnosis not present

## 2021-03-27 DIAGNOSIS — I69354 Hemiplegia and hemiparesis following cerebral infarction affecting left non-dominant side: Secondary | ICD-10-CM | POA: Diagnosis not present

## 2021-03-27 DIAGNOSIS — R109 Unspecified abdominal pain: Secondary | ICD-10-CM | POA: Diagnosis not present

## 2021-03-27 DIAGNOSIS — I080 Rheumatic disorders of both mitral and aortic valves: Secondary | ICD-10-CM | POA: Diagnosis not present

## 2021-03-27 DIAGNOSIS — Z950 Presence of cardiac pacemaker: Secondary | ICD-10-CM | POA: Diagnosis not present

## 2021-03-27 DIAGNOSIS — J984 Other disorders of lung: Secondary | ICD-10-CM | POA: Diagnosis not present

## 2021-03-27 DIAGNOSIS — Z4682 Encounter for fitting and adjustment of non-vascular catheter: Secondary | ICD-10-CM | POA: Diagnosis not present

## 2021-03-27 DIAGNOSIS — R531 Weakness: Secondary | ICD-10-CM | POA: Diagnosis not present

## 2021-03-28 DIAGNOSIS — I5042 Chronic combined systolic (congestive) and diastolic (congestive) heart failure: Secondary | ICD-10-CM | POA: Diagnosis not present

## 2021-03-28 DIAGNOSIS — I69354 Hemiplegia and hemiparesis following cerebral infarction affecting left non-dominant side: Secondary | ICD-10-CM | POA: Diagnosis not present

## 2021-03-29 DIAGNOSIS — I5042 Chronic combined systolic (congestive) and diastolic (congestive) heart failure: Secondary | ICD-10-CM | POA: Diagnosis not present

## 2021-03-29 DIAGNOSIS — I69354 Hemiplegia and hemiparesis following cerebral infarction affecting left non-dominant side: Secondary | ICD-10-CM | POA: Diagnosis not present

## 2021-03-29 DIAGNOSIS — J984 Other disorders of lung: Secondary | ICD-10-CM | POA: Diagnosis not present

## 2021-03-29 DIAGNOSIS — Z4682 Encounter for fitting and adjustment of non-vascular catheter: Secondary | ICD-10-CM | POA: Diagnosis not present

## 2021-03-29 DIAGNOSIS — J811 Chronic pulmonary edema: Secondary | ICD-10-CM | POA: Diagnosis not present

## 2021-03-29 NOTE — Progress Notes (Signed)
No ICM remote transmission received for 03/27/2021 and next ICM transmission scheduled for 04/15/2021.

## 2021-03-30 DIAGNOSIS — I69354 Hemiplegia and hemiparesis following cerebral infarction affecting left non-dominant side: Secondary | ICD-10-CM | POA: Diagnosis not present

## 2021-03-30 DIAGNOSIS — I5042 Chronic combined systolic (congestive) and diastolic (congestive) heart failure: Secondary | ICD-10-CM | POA: Diagnosis not present

## 2021-03-31 DIAGNOSIS — I5042 Chronic combined systolic (congestive) and diastolic (congestive) heart failure: Secondary | ICD-10-CM | POA: Diagnosis not present

## 2021-03-31 DIAGNOSIS — I69354 Hemiplegia and hemiparesis following cerebral infarction affecting left non-dominant side: Secondary | ICD-10-CM | POA: Diagnosis not present

## 2021-04-01 DIAGNOSIS — R6889 Other general symptoms and signs: Secondary | ICD-10-CM | POA: Diagnosis not present

## 2021-04-01 DIAGNOSIS — I69354 Hemiplegia and hemiparesis following cerebral infarction affecting left non-dominant side: Secondary | ICD-10-CM | POA: Diagnosis not present

## 2021-04-01 DIAGNOSIS — I63423 Cerebral infarction due to embolism of bilateral anterior cerebral arteries: Secondary | ICD-10-CM | POA: Diagnosis not present

## 2021-04-01 NOTE — Telephone Encounter (Signed)
Patient's wife calling back. She states the patient is still in [facility] and does not have his monitor with him. She states she is not sure if it needs to be brought to him. She says the best number to call today is: [phone], tomorrow it will be: [phone]

## 2021-04-02 DIAGNOSIS — E785 Hyperlipidemia, unspecified: Secondary | ICD-10-CM | POA: Diagnosis not present

## 2021-04-02 DIAGNOSIS — E119 Type 2 diabetes mellitus without complications: Secondary | ICD-10-CM | POA: Diagnosis not present

## 2021-04-02 DIAGNOSIS — I69321 Dysphasia following cerebral infarction: Secondary | ICD-10-CM | POA: Diagnosis not present

## 2021-04-02 DIAGNOSIS — R6889 Other general symptoms and signs: Secondary | ICD-10-CM | POA: Diagnosis not present

## 2021-04-02 DIAGNOSIS — E870 Hyperosmolality and hypernatremia: Secondary | ICD-10-CM | POA: Diagnosis not present

## 2021-04-02 DIAGNOSIS — I5042 Chronic combined systolic (congestive) and diastolic (congestive) heart failure: Secondary | ICD-10-CM | POA: Diagnosis not present

## 2021-04-02 DIAGNOSIS — I63423 Cerebral infarction due to embolism of bilateral anterior cerebral arteries: Secondary | ICD-10-CM | POA: Diagnosis not present

## 2021-04-02 DIAGNOSIS — I10 Essential (primary) hypertension: Secondary | ICD-10-CM | POA: Diagnosis not present

## 2021-04-02 DIAGNOSIS — I69354 Hemiplegia and hemiparesis following cerebral infarction affecting left non-dominant side: Secondary | ICD-10-CM | POA: Diagnosis not present

## 2021-04-02 DIAGNOSIS — G4733 Obstructive sleep apnea (adult) (pediatric): Secondary | ICD-10-CM | POA: Diagnosis not present

## 2021-04-02 DIAGNOSIS — E7849 Other hyperlipidemia: Secondary | ICD-10-CM | POA: Diagnosis not present

## 2021-04-02 DIAGNOSIS — C649 Malignant neoplasm of unspecified kidney, except renal pelvis: Secondary | ICD-10-CM | POA: Diagnosis not present

## 2021-04-03 DIAGNOSIS — I10 Essential (primary) hypertension: Secondary | ICD-10-CM | POA: Diagnosis not present

## 2021-04-03 DIAGNOSIS — E119 Type 2 diabetes mellitus without complications: Secondary | ICD-10-CM | POA: Diagnosis not present

## 2021-04-03 DIAGNOSIS — I63423 Cerebral infarction due to embolism of bilateral anterior cerebral arteries: Secondary | ICD-10-CM | POA: Diagnosis not present

## 2021-04-03 DIAGNOSIS — E870 Hyperosmolality and hypernatremia: Secondary | ICD-10-CM | POA: Diagnosis not present

## 2021-04-03 DIAGNOSIS — I69354 Hemiplegia and hemiparesis following cerebral infarction affecting left non-dominant side: Secondary | ICD-10-CM | POA: Diagnosis not present

## 2021-04-03 DIAGNOSIS — G4733 Obstructive sleep apnea (adult) (pediatric): Secondary | ICD-10-CM | POA: Diagnosis not present

## 2021-04-03 DIAGNOSIS — R6889 Other general symptoms and signs: Secondary | ICD-10-CM | POA: Diagnosis not present

## 2021-04-03 DIAGNOSIS — E7849 Other hyperlipidemia: Secondary | ICD-10-CM | POA: Diagnosis not present

## 2021-04-04 DIAGNOSIS — I69321 Dysphasia following cerebral infarction: Secondary | ICD-10-CM | POA: Diagnosis not present

## 2021-04-04 DIAGNOSIS — I10 Essential (primary) hypertension: Secondary | ICD-10-CM | POA: Diagnosis not present

## 2021-04-04 DIAGNOSIS — E119 Type 2 diabetes mellitus without complications: Secondary | ICD-10-CM | POA: Diagnosis not present

## 2021-04-04 DIAGNOSIS — I69354 Hemiplegia and hemiparesis following cerebral infarction affecting left non-dominant side: Secondary | ICD-10-CM | POA: Diagnosis not present

## 2021-04-04 DIAGNOSIS — I5042 Chronic combined systolic (congestive) and diastolic (congestive) heart failure: Secondary | ICD-10-CM | POA: Diagnosis not present

## 2021-04-04 DIAGNOSIS — C649 Malignant neoplasm of unspecified kidney, except renal pelvis: Secondary | ICD-10-CM | POA: Diagnosis not present

## 2021-04-04 DIAGNOSIS — E785 Hyperlipidemia, unspecified: Secondary | ICD-10-CM | POA: Diagnosis not present

## 2021-04-04 DIAGNOSIS — E870 Hyperosmolality and hypernatremia: Secondary | ICD-10-CM | POA: Diagnosis not present

## 2021-04-04 DIAGNOSIS — I63423 Cerebral infarction due to embolism of bilateral anterior cerebral arteries: Secondary | ICD-10-CM | POA: Diagnosis not present

## 2021-04-04 DIAGNOSIS — R6889 Other general symptoms and signs: Secondary | ICD-10-CM | POA: Diagnosis not present

## 2021-04-04 DIAGNOSIS — G4733 Obstructive sleep apnea (adult) (pediatric): Secondary | ICD-10-CM | POA: Diagnosis not present

## 2021-04-04 DIAGNOSIS — E7849 Other hyperlipidemia: Secondary | ICD-10-CM | POA: Diagnosis not present

## 2021-04-05 DIAGNOSIS — G4733 Obstructive sleep apnea (adult) (pediatric): Secondary | ICD-10-CM | POA: Diagnosis not present

## 2021-04-05 DIAGNOSIS — I69354 Hemiplegia and hemiparesis following cerebral infarction affecting left non-dominant side: Secondary | ICD-10-CM | POA: Diagnosis not present

## 2021-04-05 DIAGNOSIS — I63423 Cerebral infarction due to embolism of bilateral anterior cerebral arteries: Secondary | ICD-10-CM | POA: Diagnosis not present

## 2021-04-05 DIAGNOSIS — E119 Type 2 diabetes mellitus without complications: Secondary | ICD-10-CM | POA: Diagnosis not present

## 2021-04-05 DIAGNOSIS — E870 Hyperosmolality and hypernatremia: Secondary | ICD-10-CM | POA: Diagnosis not present

## 2021-04-05 DIAGNOSIS — I10 Essential (primary) hypertension: Secondary | ICD-10-CM | POA: Diagnosis not present

## 2021-04-05 DIAGNOSIS — E7849 Other hyperlipidemia: Secondary | ICD-10-CM | POA: Diagnosis not present

## 2021-04-06 DIAGNOSIS — G4733 Obstructive sleep apnea (adult) (pediatric): Secondary | ICD-10-CM | POA: Diagnosis not present

## 2021-04-06 DIAGNOSIS — E7849 Other hyperlipidemia: Secondary | ICD-10-CM | POA: Diagnosis not present

## 2021-04-06 DIAGNOSIS — I69354 Hemiplegia and hemiparesis following cerebral infarction affecting left non-dominant side: Secondary | ICD-10-CM | POA: Diagnosis not present

## 2021-04-06 DIAGNOSIS — E119 Type 2 diabetes mellitus without complications: Secondary | ICD-10-CM | POA: Diagnosis not present

## 2021-04-06 DIAGNOSIS — I10 Essential (primary) hypertension: Secondary | ICD-10-CM | POA: Diagnosis not present

## 2021-04-06 DIAGNOSIS — I63423 Cerebral infarction due to embolism of bilateral anterior cerebral arteries: Secondary | ICD-10-CM | POA: Diagnosis not present

## 2021-04-06 DIAGNOSIS — E870 Hyperosmolality and hypernatremia: Secondary | ICD-10-CM | POA: Diagnosis not present

## 2021-04-07 DIAGNOSIS — I63423 Cerebral infarction due to embolism of bilateral anterior cerebral arteries: Secondary | ICD-10-CM | POA: Diagnosis not present

## 2021-04-07 DIAGNOSIS — E119 Type 2 diabetes mellitus without complications: Secondary | ICD-10-CM | POA: Diagnosis not present

## 2021-04-07 DIAGNOSIS — E7849 Other hyperlipidemia: Secondary | ICD-10-CM | POA: Diagnosis not present

## 2021-04-07 DIAGNOSIS — I10 Essential (primary) hypertension: Secondary | ICD-10-CM | POA: Diagnosis not present

## 2021-04-07 DIAGNOSIS — E870 Hyperosmolality and hypernatremia: Secondary | ICD-10-CM | POA: Diagnosis not present

## 2021-04-07 DIAGNOSIS — I69354 Hemiplegia and hemiparesis following cerebral infarction affecting left non-dominant side: Secondary | ICD-10-CM | POA: Diagnosis not present

## 2021-04-07 DIAGNOSIS — G4733 Obstructive sleep apnea (adult) (pediatric): Secondary | ICD-10-CM | POA: Diagnosis not present

## 2021-04-08 DIAGNOSIS — I63423 Cerebral infarction due to embolism of bilateral anterior cerebral arteries: Secondary | ICD-10-CM | POA: Diagnosis not present

## 2021-04-08 DIAGNOSIS — E119 Type 2 diabetes mellitus without complications: Secondary | ICD-10-CM | POA: Diagnosis not present

## 2021-04-08 DIAGNOSIS — I5042 Chronic combined systolic (congestive) and diastolic (congestive) heart failure: Secondary | ICD-10-CM | POA: Diagnosis not present

## 2021-04-08 DIAGNOSIS — E7849 Other hyperlipidemia: Secondary | ICD-10-CM | POA: Diagnosis not present

## 2021-04-08 DIAGNOSIS — I69354 Hemiplegia and hemiparesis following cerebral infarction affecting left non-dominant side: Secondary | ICD-10-CM | POA: Diagnosis not present

## 2021-04-08 DIAGNOSIS — I10 Essential (primary) hypertension: Secondary | ICD-10-CM | POA: Diagnosis not present

## 2021-04-08 DIAGNOSIS — I69321 Dysphasia following cerebral infarction: Secondary | ICD-10-CM | POA: Diagnosis not present

## 2021-04-08 DIAGNOSIS — G4733 Obstructive sleep apnea (adult) (pediatric): Secondary | ICD-10-CM | POA: Diagnosis not present

## 2021-04-09 DIAGNOSIS — R0982 Postnasal drip: Secondary | ICD-10-CM | POA: Diagnosis not present

## 2021-04-09 DIAGNOSIS — E7849 Other hyperlipidemia: Secondary | ICD-10-CM | POA: Diagnosis not present

## 2021-04-09 DIAGNOSIS — R0981 Nasal congestion: Secondary | ICD-10-CM | POA: Diagnosis not present

## 2021-04-09 DIAGNOSIS — I10 Essential (primary) hypertension: Secondary | ICD-10-CM | POA: Diagnosis not present

## 2021-04-09 DIAGNOSIS — I5042 Chronic combined systolic (congestive) and diastolic (congestive) heart failure: Secondary | ICD-10-CM | POA: Diagnosis not present

## 2021-04-09 DIAGNOSIS — I63423 Cerebral infarction due to embolism of bilateral anterior cerebral arteries: Secondary | ICD-10-CM | POA: Diagnosis not present

## 2021-04-09 DIAGNOSIS — G4733 Obstructive sleep apnea (adult) (pediatric): Secondary | ICD-10-CM | POA: Diagnosis not present

## 2021-04-09 DIAGNOSIS — E119 Type 2 diabetes mellitus without complications: Secondary | ICD-10-CM | POA: Diagnosis not present

## 2021-04-09 DIAGNOSIS — I69321 Dysphasia following cerebral infarction: Secondary | ICD-10-CM | POA: Diagnosis not present

## 2021-04-09 DIAGNOSIS — I69354 Hemiplegia and hemiparesis following cerebral infarction affecting left non-dominant side: Secondary | ICD-10-CM | POA: Diagnosis not present

## 2021-04-10 DIAGNOSIS — E7849 Other hyperlipidemia: Secondary | ICD-10-CM | POA: Diagnosis not present

## 2021-04-10 DIAGNOSIS — R918 Other nonspecific abnormal finding of lung field: Secondary | ICD-10-CM | POA: Diagnosis not present

## 2021-04-10 DIAGNOSIS — J984 Other disorders of lung: Secondary | ICD-10-CM | POA: Diagnosis not present

## 2021-04-10 DIAGNOSIS — R0981 Nasal congestion: Secondary | ICD-10-CM | POA: Diagnosis not present

## 2021-04-10 DIAGNOSIS — R0982 Postnasal drip: Secondary | ICD-10-CM | POA: Diagnosis not present

## 2021-04-10 DIAGNOSIS — J90 Pleural effusion, not elsewhere classified: Secondary | ICD-10-CM | POA: Diagnosis not present

## 2021-04-10 DIAGNOSIS — I10 Essential (primary) hypertension: Secondary | ICD-10-CM | POA: Diagnosis not present

## 2021-04-10 DIAGNOSIS — I69354 Hemiplegia and hemiparesis following cerebral infarction affecting left non-dominant side: Secondary | ICD-10-CM | POA: Diagnosis not present

## 2021-04-10 DIAGNOSIS — I63423 Cerebral infarction due to embolism of bilateral anterior cerebral arteries: Secondary | ICD-10-CM | POA: Diagnosis not present

## 2021-04-10 DIAGNOSIS — I69321 Dysphasia following cerebral infarction: Secondary | ICD-10-CM | POA: Diagnosis not present

## 2021-04-10 DIAGNOSIS — E119 Type 2 diabetes mellitus without complications: Secondary | ICD-10-CM | POA: Diagnosis not present

## 2021-04-10 DIAGNOSIS — G4733 Obstructive sleep apnea (adult) (pediatric): Secondary | ICD-10-CM | POA: Diagnosis not present

## 2021-04-10 DIAGNOSIS — I5042 Chronic combined systolic (congestive) and diastolic (congestive) heart failure: Secondary | ICD-10-CM | POA: Diagnosis not present

## 2021-04-10 DIAGNOSIS — R059 Cough, unspecified: Secondary | ICD-10-CM | POA: Diagnosis not present

## 2021-04-11 DIAGNOSIS — I5042 Chronic combined systolic (congestive) and diastolic (congestive) heart failure: Secondary | ICD-10-CM | POA: Diagnosis not present

## 2021-04-11 DIAGNOSIS — R0981 Nasal congestion: Secondary | ICD-10-CM | POA: Diagnosis not present

## 2021-04-11 DIAGNOSIS — R0982 Postnasal drip: Secondary | ICD-10-CM | POA: Diagnosis not present

## 2021-04-11 DIAGNOSIS — E7849 Other hyperlipidemia: Secondary | ICD-10-CM | POA: Diagnosis not present

## 2021-04-11 DIAGNOSIS — E119 Type 2 diabetes mellitus without complications: Secondary | ICD-10-CM | POA: Diagnosis not present

## 2021-04-11 DIAGNOSIS — G4733 Obstructive sleep apnea (adult) (pediatric): Secondary | ICD-10-CM | POA: Diagnosis not present

## 2021-04-11 DIAGNOSIS — I63423 Cerebral infarction due to embolism of bilateral anterior cerebral arteries: Secondary | ICD-10-CM | POA: Diagnosis not present

## 2021-04-11 DIAGNOSIS — I69321 Dysphasia following cerebral infarction: Secondary | ICD-10-CM | POA: Diagnosis not present

## 2021-04-11 DIAGNOSIS — I10 Essential (primary) hypertension: Secondary | ICD-10-CM | POA: Diagnosis not present

## 2021-04-11 DIAGNOSIS — I69354 Hemiplegia and hemiparesis following cerebral infarction affecting left non-dominant side: Secondary | ICD-10-CM | POA: Diagnosis not present

## 2021-04-12 DIAGNOSIS — I69354 Hemiplegia and hemiparesis following cerebral infarction affecting left non-dominant side: Secondary | ICD-10-CM | POA: Diagnosis not present

## 2021-04-12 DIAGNOSIS — E041 Nontoxic single thyroid nodule: Secondary | ICD-10-CM | POA: Diagnosis not present

## 2021-04-12 DIAGNOSIS — I63423 Cerebral infarction due to embolism of bilateral anterior cerebral arteries: Secondary | ICD-10-CM | POA: Diagnosis not present

## 2021-04-12 DIAGNOSIS — R0982 Postnasal drip: Secondary | ICD-10-CM | POA: Diagnosis not present

## 2021-04-12 DIAGNOSIS — R0981 Nasal congestion: Secondary | ICD-10-CM | POA: Diagnosis not present

## 2021-04-12 DIAGNOSIS — K089 Disorder of teeth and supporting structures, unspecified: Secondary | ICD-10-CM | POA: Diagnosis not present

## 2021-04-12 DIAGNOSIS — I10 Essential (primary) hypertension: Secondary | ICD-10-CM | POA: Diagnosis not present

## 2021-04-12 DIAGNOSIS — R2232 Localized swelling, mass and lump, left upper limb: Secondary | ICD-10-CM | POA: Diagnosis not present

## 2021-04-12 DIAGNOSIS — G4733 Obstructive sleep apnea (adult) (pediatric): Secondary | ICD-10-CM | POA: Diagnosis not present

## 2021-04-12 DIAGNOSIS — E7849 Other hyperlipidemia: Secondary | ICD-10-CM | POA: Diagnosis not present

## 2021-04-12 DIAGNOSIS — I5042 Chronic combined systolic (congestive) and diastolic (congestive) heart failure: Secondary | ICD-10-CM | POA: Diagnosis not present

## 2021-04-12 DIAGNOSIS — I69321 Dysphasia following cerebral infarction: Secondary | ICD-10-CM | POA: Diagnosis not present

## 2021-04-12 DIAGNOSIS — E119 Type 2 diabetes mellitus without complications: Secondary | ICD-10-CM | POA: Diagnosis not present

## 2021-04-13 DIAGNOSIS — R0981 Nasal congestion: Secondary | ICD-10-CM | POA: Diagnosis not present

## 2021-04-13 DIAGNOSIS — R0982 Postnasal drip: Secondary | ICD-10-CM | POA: Diagnosis not present

## 2021-04-13 DIAGNOSIS — E7849 Other hyperlipidemia: Secondary | ICD-10-CM | POA: Diagnosis not present

## 2021-04-13 DIAGNOSIS — E119 Type 2 diabetes mellitus without complications: Secondary | ICD-10-CM | POA: Diagnosis not present

## 2021-04-13 DIAGNOSIS — I63423 Cerebral infarction due to embolism of bilateral anterior cerebral arteries: Secondary | ICD-10-CM | POA: Diagnosis not present

## 2021-04-13 DIAGNOSIS — I69354 Hemiplegia and hemiparesis following cerebral infarction affecting left non-dominant side: Secondary | ICD-10-CM | POA: Diagnosis not present

## 2021-04-13 DIAGNOSIS — I10 Essential (primary) hypertension: Secondary | ICD-10-CM | POA: Diagnosis not present

## 2021-04-13 DIAGNOSIS — G4733 Obstructive sleep apnea (adult) (pediatric): Secondary | ICD-10-CM | POA: Diagnosis not present

## 2021-04-13 DIAGNOSIS — I69321 Dysphasia following cerebral infarction: Secondary | ICD-10-CM | POA: Diagnosis not present

## 2021-04-13 DIAGNOSIS — I5042 Chronic combined systolic (congestive) and diastolic (congestive) heart failure: Secondary | ICD-10-CM | POA: Diagnosis not present

## 2021-04-14 DIAGNOSIS — E7849 Other hyperlipidemia: Secondary | ICD-10-CM | POA: Diagnosis not present

## 2021-04-14 DIAGNOSIS — R0981 Nasal congestion: Secondary | ICD-10-CM | POA: Diagnosis not present

## 2021-04-14 DIAGNOSIS — G4733 Obstructive sleep apnea (adult) (pediatric): Secondary | ICD-10-CM | POA: Diagnosis not present

## 2021-04-14 DIAGNOSIS — I69354 Hemiplegia and hemiparesis following cerebral infarction affecting left non-dominant side: Secondary | ICD-10-CM | POA: Diagnosis not present

## 2021-04-14 DIAGNOSIS — I63423 Cerebral infarction due to embolism of bilateral anterior cerebral arteries: Secondary | ICD-10-CM | POA: Diagnosis not present

## 2021-04-14 DIAGNOSIS — I10 Essential (primary) hypertension: Secondary | ICD-10-CM | POA: Diagnosis not present

## 2021-04-14 DIAGNOSIS — I5042 Chronic combined systolic (congestive) and diastolic (congestive) heart failure: Secondary | ICD-10-CM | POA: Diagnosis not present

## 2021-04-14 DIAGNOSIS — I69321 Dysphasia following cerebral infarction: Secondary | ICD-10-CM | POA: Diagnosis not present

## 2021-04-14 DIAGNOSIS — E119 Type 2 diabetes mellitus without complications: Secondary | ICD-10-CM | POA: Diagnosis not present

## 2021-04-14 DIAGNOSIS — R0982 Postnasal drip: Secondary | ICD-10-CM | POA: Diagnosis not present

## 2021-04-15 DIAGNOSIS — G20 Parkinson's disease: Secondary | ICD-10-CM | POA: Diagnosis not present

## 2021-04-15 DIAGNOSIS — R0902 Hypoxemia: Secondary | ICD-10-CM | POA: Diagnosis not present

## 2021-04-15 DIAGNOSIS — R1310 Dysphagia, unspecified: Secondary | ICD-10-CM | POA: Diagnosis not present

## 2021-04-15 DIAGNOSIS — M6281 Muscle weakness (generalized): Secondary | ICD-10-CM | POA: Diagnosis not present

## 2021-04-15 DIAGNOSIS — I69354 Hemiplegia and hemiparesis following cerebral infarction affecting left non-dominant side: Secondary | ICD-10-CM | POA: Diagnosis not present

## 2021-04-15 DIAGNOSIS — N139 Obstructive and reflux uropathy, unspecified: Secondary | ICD-10-CM | POA: Diagnosis not present

## 2021-04-15 DIAGNOSIS — Z7401 Bed confinement status: Secondary | ICD-10-CM | POA: Diagnosis not present

## 2021-04-15 DIAGNOSIS — Z20822 Contact with and (suspected) exposure to covid-19: Secondary | ICD-10-CM | POA: Diagnosis not present

## 2021-04-15 DIAGNOSIS — Z1159 Encounter for screening for other viral diseases: Secondary | ICD-10-CM | POA: Diagnosis not present

## 2021-04-15 DIAGNOSIS — E1165 Type 2 diabetes mellitus with hyperglycemia: Secondary | ICD-10-CM | POA: Diagnosis not present

## 2021-04-15 DIAGNOSIS — R739 Hyperglycemia, unspecified: Secondary | ICD-10-CM | POA: Diagnosis not present

## 2021-04-15 DIAGNOSIS — I5042 Chronic combined systolic (congestive) and diastolic (congestive) heart failure: Secondary | ICD-10-CM | POA: Diagnosis not present

## 2021-04-15 DIAGNOSIS — I10 Essential (primary) hypertension: Secondary | ICD-10-CM | POA: Diagnosis not present

## 2021-04-15 DIAGNOSIS — J80 Acute respiratory distress syndrome: Secondary | ICD-10-CM | POA: Diagnosis not present

## 2021-04-15 DIAGNOSIS — A0472 Enterocolitis due to Clostridium difficile, not specified as recurrent: Secondary | ICD-10-CM | POA: Diagnosis not present

## 2021-04-15 DIAGNOSIS — G9349 Other encephalopathy: Secondary | ICD-10-CM | POA: Diagnosis not present

## 2021-04-15 DIAGNOSIS — R41841 Cognitive communication deficit: Secondary | ICD-10-CM | POA: Diagnosis not present

## 2021-04-15 DIAGNOSIS — R531 Weakness: Secondary | ICD-10-CM | POA: Diagnosis not present

## 2021-04-15 DIAGNOSIS — Z794 Long term (current) use of insulin: Secondary | ICD-10-CM | POA: Diagnosis not present

## 2021-04-15 DIAGNOSIS — G4733 Obstructive sleep apnea (adult) (pediatric): Secondary | ICD-10-CM | POA: Diagnosis not present

## 2021-04-15 DIAGNOSIS — I69391 Dysphagia following cerebral infarction: Secondary | ICD-10-CM | POA: Diagnosis not present

## 2021-04-15 DIAGNOSIS — R0981 Nasal congestion: Secondary | ICD-10-CM | POA: Diagnosis not present

## 2021-04-15 DIAGNOSIS — G9341 Metabolic encephalopathy: Secondary | ICD-10-CM | POA: Diagnosis not present

## 2021-04-15 DIAGNOSIS — R0602 Shortness of breath: Secondary | ICD-10-CM | POA: Diagnosis not present

## 2021-04-15 DIAGNOSIS — J9601 Acute respiratory failure with hypoxia: Secondary | ICD-10-CM | POA: Diagnosis not present

## 2021-04-15 DIAGNOSIS — E7849 Other hyperlipidemia: Secondary | ICD-10-CM | POA: Diagnosis not present

## 2021-04-15 DIAGNOSIS — Z931 Gastrostomy status: Secondary | ICD-10-CM | POA: Diagnosis not present

## 2021-04-15 DIAGNOSIS — I6330 Cerebral infarction due to thrombosis of unspecified cerebral artery: Secondary | ICD-10-CM | POA: Diagnosis not present

## 2021-04-15 DIAGNOSIS — N400 Enlarged prostate without lower urinary tract symptoms: Secondary | ICD-10-CM | POA: Diagnosis not present

## 2021-04-15 DIAGNOSIS — Z6828 Body mass index (BMI) 28.0-28.9, adult: Secondary | ICD-10-CM | POA: Diagnosis not present

## 2021-04-15 DIAGNOSIS — R4182 Altered mental status, unspecified: Secondary | ICD-10-CM | POA: Diagnosis not present

## 2021-04-15 DIAGNOSIS — J690 Pneumonitis due to inhalation of food and vomit: Secondary | ICD-10-CM | POA: Diagnosis not present

## 2021-04-15 DIAGNOSIS — C649 Malignant neoplasm of unspecified kidney, except renal pelvis: Secondary | ICD-10-CM | POA: Diagnosis not present

## 2021-04-15 DIAGNOSIS — A044 Other intestinal Escherichia coli infections: Secondary | ICD-10-CM | POA: Diagnosis not present

## 2021-04-15 DIAGNOSIS — R4020 Unspecified coma: Secondary | ICD-10-CM | POA: Diagnosis not present

## 2021-04-15 DIAGNOSIS — R509 Fever, unspecified: Secondary | ICD-10-CM | POA: Diagnosis not present

## 2021-04-15 DIAGNOSIS — R569 Unspecified convulsions: Secondary | ICD-10-CM | POA: Diagnosis not present

## 2021-04-15 DIAGNOSIS — E785 Hyperlipidemia, unspecified: Secondary | ICD-10-CM | POA: Diagnosis not present

## 2021-04-15 DIAGNOSIS — K219 Gastro-esophageal reflux disease without esophagitis: Secondary | ICD-10-CM | POA: Diagnosis not present

## 2021-04-15 DIAGNOSIS — B964 Proteus (mirabilis) (morganii) as the cause of diseases classified elsewhere: Secondary | ICD-10-CM | POA: Diagnosis not present

## 2021-04-15 DIAGNOSIS — R404 Transient alteration of awareness: Secondary | ICD-10-CM | POA: Diagnosis not present

## 2021-04-15 DIAGNOSIS — Z743 Need for continuous supervision: Secondary | ICD-10-CM | POA: Diagnosis not present

## 2021-04-15 DIAGNOSIS — E119 Type 2 diabetes mellitus without complications: Secondary | ICD-10-CM | POA: Diagnosis not present

## 2021-04-15 DIAGNOSIS — R0689 Other abnormalities of breathing: Secondary | ICD-10-CM | POA: Diagnosis not present

## 2021-04-15 DIAGNOSIS — L89152 Pressure ulcer of sacral region, stage 2: Secondary | ICD-10-CM | POA: Diagnosis not present

## 2021-04-15 DIAGNOSIS — R0982 Postnasal drip: Secondary | ICD-10-CM | POA: Diagnosis not present

## 2021-04-15 DIAGNOSIS — I63423 Cerebral infarction due to embolism of bilateral anterior cerebral arteries: Secondary | ICD-10-CM | POA: Diagnosis not present

## 2021-04-15 DIAGNOSIS — N390 Urinary tract infection, site not specified: Secondary | ICD-10-CM | POA: Diagnosis not present

## 2021-04-15 DIAGNOSIS — G459 Transient cerebral ischemic attack, unspecified: Secondary | ICD-10-CM | POA: Diagnosis not present

## 2021-04-15 DIAGNOSIS — I69321 Dysphasia following cerebral infarction: Secondary | ICD-10-CM | POA: Diagnosis not present

## 2021-04-15 DIAGNOSIS — I639 Cerebral infarction, unspecified: Secondary | ICD-10-CM | POA: Diagnosis not present

## 2021-04-15 DIAGNOSIS — A419 Sepsis, unspecified organism: Secondary | ICD-10-CM | POA: Diagnosis not present

## 2021-04-15 NOTE — Progress Notes (Unsigned)
No ICM remote transmission received for 04/15/2021 due to hospitalization and next ICM transmission scheduled for 05/06/2021.

## 2021-04-16 DIAGNOSIS — I10 Essential (primary) hypertension: Secondary | ICD-10-CM | POA: Diagnosis not present

## 2021-04-16 DIAGNOSIS — I6330 Cerebral infarction due to thrombosis of unspecified cerebral artery: Secondary | ICD-10-CM | POA: Diagnosis not present

## 2021-04-16 DIAGNOSIS — I69391 Dysphagia following cerebral infarction: Secondary | ICD-10-CM | POA: Diagnosis not present

## 2021-04-16 DIAGNOSIS — I5042 Chronic combined systolic (congestive) and diastolic (congestive) heart failure: Secondary | ICD-10-CM | POA: Diagnosis not present

## 2021-04-18 DIAGNOSIS — I5042 Chronic combined systolic (congestive) and diastolic (congestive) heart failure: Secondary | ICD-10-CM | POA: Diagnosis not present

## 2021-04-18 DIAGNOSIS — M6281 Muscle weakness (generalized): Secondary | ICD-10-CM | POA: Diagnosis not present

## 2021-04-18 DIAGNOSIS — J690 Pneumonitis due to inhalation of food and vomit: Secondary | ICD-10-CM | POA: Diagnosis not present

## 2021-04-18 DIAGNOSIS — I69354 Hemiplegia and hemiparesis following cerebral infarction affecting left non-dominant side: Secondary | ICD-10-CM | POA: Diagnosis not present

## 2021-04-18 DIAGNOSIS — R739 Hyperglycemia, unspecified: Secondary | ICD-10-CM | POA: Diagnosis not present

## 2021-04-18 DIAGNOSIS — R0689 Other abnormalities of breathing: Secondary | ICD-10-CM | POA: Diagnosis not present

## 2021-04-18 DIAGNOSIS — Z7401 Bed confinement status: Secondary | ICD-10-CM | POA: Diagnosis not present

## 2021-04-18 DIAGNOSIS — G9341 Metabolic encephalopathy: Secondary | ICD-10-CM | POA: Diagnosis not present

## 2021-04-18 DIAGNOSIS — K219 Gastro-esophageal reflux disease without esophagitis: Secondary | ICD-10-CM | POA: Diagnosis not present

## 2021-04-18 DIAGNOSIS — R509 Fever, unspecified: Secondary | ICD-10-CM | POA: Diagnosis not present

## 2021-04-18 DIAGNOSIS — N139 Obstructive and reflux uropathy, unspecified: Secondary | ICD-10-CM | POA: Diagnosis not present

## 2021-04-18 DIAGNOSIS — E785 Hyperlipidemia, unspecified: Secondary | ICD-10-CM | POA: Diagnosis not present

## 2021-04-18 DIAGNOSIS — J80 Acute respiratory distress syndrome: Secondary | ICD-10-CM | POA: Diagnosis not present

## 2021-04-18 DIAGNOSIS — A0472 Enterocolitis due to Clostridium difficile, not specified as recurrent: Secondary | ICD-10-CM | POA: Diagnosis not present

## 2021-04-18 DIAGNOSIS — Z6828 Body mass index (BMI) 28.0-28.9, adult: Secondary | ICD-10-CM | POA: Diagnosis not present

## 2021-04-18 DIAGNOSIS — R4182 Altered mental status, unspecified: Secondary | ICD-10-CM | POA: Diagnosis not present

## 2021-04-18 DIAGNOSIS — E119 Type 2 diabetes mellitus without complications: Secondary | ICD-10-CM | POA: Diagnosis not present

## 2021-04-18 DIAGNOSIS — C649 Malignant neoplasm of unspecified kidney, except renal pelvis: Secondary | ICD-10-CM | POA: Diagnosis not present

## 2021-04-18 DIAGNOSIS — Z931 Gastrostomy status: Secondary | ICD-10-CM | POA: Diagnosis not present

## 2021-04-18 DIAGNOSIS — R4020 Unspecified coma: Secondary | ICD-10-CM | POA: Diagnosis not present

## 2021-04-18 DIAGNOSIS — R531 Weakness: Secondary | ICD-10-CM | POA: Diagnosis not present

## 2021-04-18 DIAGNOSIS — Z20822 Contact with and (suspected) exposure to covid-19: Secondary | ICD-10-CM | POA: Diagnosis not present

## 2021-04-18 DIAGNOSIS — R0902 Hypoxemia: Secondary | ICD-10-CM | POA: Diagnosis not present

## 2021-04-18 DIAGNOSIS — Z794 Long term (current) use of insulin: Secondary | ICD-10-CM | POA: Diagnosis not present

## 2021-04-18 DIAGNOSIS — B964 Proteus (mirabilis) (morganii) as the cause of diseases classified elsewhere: Secondary | ICD-10-CM | POA: Diagnosis not present

## 2021-04-18 DIAGNOSIS — A419 Sepsis, unspecified organism: Secondary | ICD-10-CM | POA: Diagnosis not present

## 2021-04-18 DIAGNOSIS — N390 Urinary tract infection, site not specified: Secondary | ICD-10-CM | POA: Diagnosis not present

## 2021-04-18 DIAGNOSIS — R41841 Cognitive communication deficit: Secondary | ICD-10-CM | POA: Diagnosis not present

## 2021-04-18 DIAGNOSIS — R404 Transient alteration of awareness: Secondary | ICD-10-CM | POA: Diagnosis not present

## 2021-04-18 DIAGNOSIS — Z1159 Encounter for screening for other viral diseases: Secondary | ICD-10-CM | POA: Diagnosis not present

## 2021-04-18 DIAGNOSIS — G4733 Obstructive sleep apnea (adult) (pediatric): Secondary | ICD-10-CM | POA: Diagnosis not present

## 2021-04-18 DIAGNOSIS — N400 Enlarged prostate without lower urinary tract symptoms: Secondary | ICD-10-CM | POA: Diagnosis not present

## 2021-04-18 DIAGNOSIS — I6330 Cerebral infarction due to thrombosis of unspecified cerebral artery: Secondary | ICD-10-CM | POA: Diagnosis not present

## 2021-04-18 DIAGNOSIS — E1165 Type 2 diabetes mellitus with hyperglycemia: Secondary | ICD-10-CM | POA: Diagnosis not present

## 2021-04-18 DIAGNOSIS — I639 Cerebral infarction, unspecified: Secondary | ICD-10-CM | POA: Diagnosis not present

## 2021-04-18 DIAGNOSIS — R0602 Shortness of breath: Secondary | ICD-10-CM | POA: Diagnosis not present

## 2021-04-18 DIAGNOSIS — J9601 Acute respiratory failure with hypoxia: Secondary | ICD-10-CM | POA: Diagnosis not present

## 2021-04-18 DIAGNOSIS — G20 Parkinson's disease: Secondary | ICD-10-CM | POA: Diagnosis not present

## 2021-04-18 DIAGNOSIS — I69391 Dysphagia following cerebral infarction: Secondary | ICD-10-CM | POA: Diagnosis not present

## 2021-04-18 DIAGNOSIS — A044 Other intestinal Escherichia coli infections: Secondary | ICD-10-CM | POA: Diagnosis not present

## 2021-04-18 DIAGNOSIS — L89152 Pressure ulcer of sacral region, stage 2: Secondary | ICD-10-CM | POA: Diagnosis not present

## 2021-04-18 DIAGNOSIS — I10 Essential (primary) hypertension: Secondary | ICD-10-CM | POA: Diagnosis not present

## 2021-04-19 DIAGNOSIS — L89152 Pressure ulcer of sacral region, stage 2: Secondary | ICD-10-CM | POA: Diagnosis not present

## 2021-04-19 DIAGNOSIS — A0472 Enterocolitis due to Clostridium difficile, not specified as recurrent: Secondary | ICD-10-CM | POA: Diagnosis not present

## 2021-04-22 DIAGNOSIS — I10 Essential (primary) hypertension: Secondary | ICD-10-CM | POA: Diagnosis not present

## 2021-04-22 DIAGNOSIS — I69391 Dysphagia following cerebral infarction: Secondary | ICD-10-CM | POA: Diagnosis not present

## 2021-04-22 DIAGNOSIS — I6330 Cerebral infarction due to thrombosis of unspecified cerebral artery: Secondary | ICD-10-CM | POA: Diagnosis not present

## 2021-04-22 DIAGNOSIS — I5042 Chronic combined systolic (congestive) and diastolic (congestive) heart failure: Secondary | ICD-10-CM | POA: Diagnosis not present

## 2021-04-23 DIAGNOSIS — I639 Cerebral infarction, unspecified: Secondary | ICD-10-CM | POA: Diagnosis not present

## 2021-04-23 DIAGNOSIS — R739 Hyperglycemia, unspecified: Secondary | ICD-10-CM | POA: Diagnosis not present

## 2021-04-23 DIAGNOSIS — E119 Type 2 diabetes mellitus without complications: Secondary | ICD-10-CM | POA: Diagnosis not present

## 2021-04-25 DIAGNOSIS — R0902 Hypoxemia: Secondary | ICD-10-CM | POA: Diagnosis not present

## 2021-04-25 DIAGNOSIS — R531 Weakness: Secondary | ICD-10-CM | POA: Diagnosis not present

## 2021-04-25 DIAGNOSIS — R0602 Shortness of breath: Secondary | ICD-10-CM | POA: Diagnosis not present

## 2021-04-25 DIAGNOSIS — Z20822 Contact with and (suspected) exposure to covid-19: Secondary | ICD-10-CM | POA: Diagnosis not present

## 2021-04-26 DIAGNOSIS — A0472 Enterocolitis due to Clostridium difficile, not specified as recurrent: Secondary | ICD-10-CM | POA: Diagnosis not present

## 2021-04-29 DIAGNOSIS — R569 Unspecified convulsions: Secondary | ICD-10-CM | POA: Diagnosis not present

## 2021-04-29 DIAGNOSIS — R4182 Altered mental status, unspecified: Secondary | ICD-10-CM | POA: Diagnosis not present

## 2021-04-29 DIAGNOSIS — R0602 Shortness of breath: Secondary | ICD-10-CM | POA: Diagnosis not present

## 2021-04-29 DIAGNOSIS — N390 Urinary tract infection, site not specified: Secondary | ICD-10-CM | POA: Diagnosis not present

## 2021-04-29 DIAGNOSIS — R509 Fever, unspecified: Secondary | ICD-10-CM | POA: Diagnosis not present

## 2021-04-29 DIAGNOSIS — L89152 Pressure ulcer of sacral region, stage 2: Secondary | ICD-10-CM | POA: Diagnosis not present

## 2021-04-29 DIAGNOSIS — Z20822 Contact with and (suspected) exposure to covid-19: Secondary | ICD-10-CM | POA: Diagnosis not present

## 2021-04-30 DIAGNOSIS — G9349 Other encephalopathy: Secondary | ICD-10-CM | POA: Diagnosis not present

## 2021-04-30 DIAGNOSIS — R0902 Hypoxemia: Secondary | ICD-10-CM | POA: Diagnosis not present

## 2021-04-30 DIAGNOSIS — N390 Urinary tract infection, site not specified: Secondary | ICD-10-CM | POA: Diagnosis not present

## 2021-05-01 DIAGNOSIS — G9349 Other encephalopathy: Secondary | ICD-10-CM | POA: Diagnosis not present

## 2021-05-01 DIAGNOSIS — N390 Urinary tract infection, site not specified: Secondary | ICD-10-CM | POA: Diagnosis not present

## 2021-05-02 DIAGNOSIS — N390 Urinary tract infection, site not specified: Secondary | ICD-10-CM | POA: Diagnosis not present

## 2021-05-02 DIAGNOSIS — G9349 Other encephalopathy: Secondary | ICD-10-CM | POA: Diagnosis not present

## 2021-05-03 DIAGNOSIS — G9349 Other encephalopathy: Secondary | ICD-10-CM | POA: Diagnosis not present

## 2021-05-03 DIAGNOSIS — N390 Urinary tract infection, site not specified: Secondary | ICD-10-CM | POA: Diagnosis not present

## 2021-05-04 DIAGNOSIS — G9349 Other encephalopathy: Secondary | ICD-10-CM | POA: Diagnosis not present

## 2021-05-04 DIAGNOSIS — N390 Urinary tract infection, site not specified: Secondary | ICD-10-CM | POA: Diagnosis not present

## 2021-05-05 DIAGNOSIS — G9349 Other encephalopathy: Secondary | ICD-10-CM | POA: Diagnosis not present

## 2021-05-05 DIAGNOSIS — N390 Urinary tract infection, site not specified: Secondary | ICD-10-CM | POA: Diagnosis not present

## 2021-05-06 DIAGNOSIS — G9349 Other encephalopathy: Secondary | ICD-10-CM | POA: Diagnosis not present

## 2021-05-06 DIAGNOSIS — N390 Urinary tract infection, site not specified: Secondary | ICD-10-CM | POA: Diagnosis not present

## 2021-05-07 DIAGNOSIS — R531 Weakness: Secondary | ICD-10-CM | POA: Diagnosis not present

## 2021-05-07 DIAGNOSIS — R600 Localized edema: Secondary | ICD-10-CM | POA: Diagnosis not present

## 2021-05-07 DIAGNOSIS — R41841 Cognitive communication deficit: Secondary | ICD-10-CM | POA: Diagnosis not present

## 2021-05-07 DIAGNOSIS — R0902 Hypoxemia: Secondary | ICD-10-CM | POA: Diagnosis not present

## 2021-05-07 DIAGNOSIS — L98429 Non-pressure chronic ulcer of back with unspecified severity: Secondary | ICD-10-CM | POA: Diagnosis not present

## 2021-05-07 DIAGNOSIS — R2232 Localized swelling, mass and lump, left upper limb: Secondary | ICD-10-CM | POA: Diagnosis not present

## 2021-05-07 DIAGNOSIS — E782 Mixed hyperlipidemia: Secondary | ICD-10-CM | POA: Diagnosis not present

## 2021-05-07 DIAGNOSIS — Z950 Presence of cardiac pacemaker: Secondary | ICD-10-CM | POA: Diagnosis not present

## 2021-05-07 DIAGNOSIS — R195 Other fecal abnormalities: Secondary | ICD-10-CM | POA: Diagnosis not present

## 2021-05-07 DIAGNOSIS — I69354 Hemiplegia and hemiparesis following cerebral infarction affecting left non-dominant side: Secondary | ICD-10-CM | POA: Diagnosis not present

## 2021-05-07 DIAGNOSIS — I495 Sick sinus syndrome: Secondary | ICD-10-CM | POA: Diagnosis not present

## 2021-05-07 DIAGNOSIS — J9600 Acute respiratory failure, unspecified whether with hypoxia or hypercapnia: Secondary | ICD-10-CM | POA: Diagnosis not present

## 2021-05-07 DIAGNOSIS — I509 Heart failure, unspecified: Secondary | ICD-10-CM | POA: Diagnosis not present

## 2021-05-07 DIAGNOSIS — I10 Essential (primary) hypertension: Secondary | ICD-10-CM | POA: Diagnosis not present

## 2021-05-07 DIAGNOSIS — R1310 Dysphagia, unspecified: Secondary | ICD-10-CM | POA: Diagnosis not present

## 2021-05-07 DIAGNOSIS — I69991 Dysphagia following unspecified cerebrovascular disease: Secondary | ICD-10-CM | POA: Diagnosis not present

## 2021-05-07 DIAGNOSIS — R1312 Dysphagia, oropharyngeal phase: Secondary | ICD-10-CM | POA: Diagnosis not present

## 2021-05-07 DIAGNOSIS — G9349 Other encephalopathy: Secondary | ICD-10-CM | POA: Diagnosis not present

## 2021-05-07 DIAGNOSIS — J698 Pneumonitis due to inhalation of other solids and liquids: Secondary | ICD-10-CM | POA: Diagnosis not present

## 2021-05-07 DIAGNOSIS — B964 Proteus (mirabilis) (morganii) as the cause of diseases classified elsewhere: Secondary | ICD-10-CM | POA: Diagnosis not present

## 2021-05-07 DIAGNOSIS — W1830XA Fall on same level, unspecified, initial encounter: Secondary | ICD-10-CM | POA: Diagnosis not present

## 2021-05-07 DIAGNOSIS — G20 Parkinson's disease: Secondary | ICD-10-CM | POA: Diagnosis not present

## 2021-05-07 DIAGNOSIS — R4182 Altered mental status, unspecified: Secondary | ICD-10-CM | POA: Diagnosis not present

## 2021-05-07 DIAGNOSIS — Z7401 Bed confinement status: Secondary | ICD-10-CM | POA: Diagnosis not present

## 2021-05-07 DIAGNOSIS — Z743 Need for continuous supervision: Secondary | ICD-10-CM | POA: Diagnosis not present

## 2021-05-07 DIAGNOSIS — N401 Enlarged prostate with lower urinary tract symptoms: Secondary | ICD-10-CM | POA: Diagnosis not present

## 2021-05-07 DIAGNOSIS — I69391 Dysphagia following cerebral infarction: Secondary | ICD-10-CM | POA: Diagnosis not present

## 2021-05-07 DIAGNOSIS — M19042 Primary osteoarthritis, left hand: Secondary | ICD-10-CM | POA: Diagnosis not present

## 2021-05-07 DIAGNOSIS — L89152 Pressure ulcer of sacral region, stage 2: Secondary | ICD-10-CM | POA: Diagnosis not present

## 2021-05-07 DIAGNOSIS — R6884 Jaw pain: Secondary | ICD-10-CM | POA: Diagnosis not present

## 2021-05-07 DIAGNOSIS — E119 Type 2 diabetes mellitus without complications: Secondary | ICD-10-CM | POA: Diagnosis not present

## 2021-05-07 DIAGNOSIS — K219 Gastro-esophageal reflux disease without esophagitis: Secondary | ICD-10-CM | POA: Diagnosis not present

## 2021-05-07 DIAGNOSIS — M6281 Muscle weakness (generalized): Secondary | ICD-10-CM | POA: Diagnosis not present

## 2021-05-07 DIAGNOSIS — R404 Transient alteration of awareness: Secondary | ICD-10-CM | POA: Diagnosis not present

## 2021-05-07 DIAGNOSIS — E785 Hyperlipidemia, unspecified: Secondary | ICD-10-CM | POA: Diagnosis not present

## 2021-05-07 DIAGNOSIS — I5023 Acute on chronic systolic (congestive) heart failure: Secondary | ICD-10-CM | POA: Diagnosis not present

## 2021-05-07 DIAGNOSIS — Z20822 Contact with and (suspected) exposure to covid-19: Secondary | ICD-10-CM | POA: Diagnosis not present

## 2021-05-07 DIAGNOSIS — N390 Urinary tract infection, site not specified: Secondary | ICD-10-CM | POA: Diagnosis not present

## 2021-05-07 DIAGNOSIS — R2689 Other abnormalities of gait and mobility: Secondary | ICD-10-CM | POA: Diagnosis not present

## 2021-05-07 DIAGNOSIS — Z8619 Personal history of other infectious and parasitic diseases: Secondary | ICD-10-CM | POA: Diagnosis not present

## 2021-05-07 DIAGNOSIS — A419 Sepsis, unspecified organism: Secondary | ICD-10-CM | POA: Diagnosis not present

## 2021-05-07 DIAGNOSIS — N400 Enlarged prostate without lower urinary tract symptoms: Secondary | ICD-10-CM | POA: Diagnosis not present

## 2021-05-07 DIAGNOSIS — Z931 Gastrostomy status: Secondary | ICD-10-CM | POA: Diagnosis not present

## 2021-05-07 DIAGNOSIS — Z7409 Other reduced mobility: Secondary | ICD-10-CM | POA: Diagnosis not present

## 2021-05-07 DIAGNOSIS — M7989 Other specified soft tissue disorders: Secondary | ICD-10-CM | POA: Diagnosis not present

## 2021-05-08 DIAGNOSIS — W1830XA Fall on same level, unspecified, initial encounter: Secondary | ICD-10-CM | POA: Diagnosis not present

## 2021-05-08 DIAGNOSIS — I69391 Dysphagia following cerebral infarction: Secondary | ICD-10-CM | POA: Diagnosis not present

## 2021-05-08 DIAGNOSIS — J698 Pneumonitis due to inhalation of other solids and liquids: Secondary | ICD-10-CM | POA: Diagnosis not present

## 2021-05-08 DIAGNOSIS — I69991 Dysphagia following unspecified cerebrovascular disease: Secondary | ICD-10-CM | POA: Diagnosis not present

## 2021-05-08 DIAGNOSIS — R4182 Altered mental status, unspecified: Secondary | ICD-10-CM | POA: Diagnosis not present

## 2021-05-08 DIAGNOSIS — A419 Sepsis, unspecified organism: Secondary | ICD-10-CM | POA: Diagnosis not present

## 2021-05-08 DIAGNOSIS — I69354 Hemiplegia and hemiparesis following cerebral infarction affecting left non-dominant side: Secondary | ICD-10-CM | POA: Diagnosis not present

## 2021-05-08 DIAGNOSIS — R1310 Dysphagia, unspecified: Secondary | ICD-10-CM | POA: Diagnosis not present

## 2021-05-08 DIAGNOSIS — Z20822 Contact with and (suspected) exposure to covid-19: Secondary | ICD-10-CM | POA: Diagnosis not present

## 2021-05-08 DIAGNOSIS — J9600 Acute respiratory failure, unspecified whether with hypoxia or hypercapnia: Secondary | ICD-10-CM | POA: Diagnosis not present

## 2021-05-08 DIAGNOSIS — Z931 Gastrostomy status: Secondary | ICD-10-CM | POA: Diagnosis not present

## 2021-05-08 DIAGNOSIS — B964 Proteus (mirabilis) (morganii) as the cause of diseases classified elsewhere: Secondary | ICD-10-CM | POA: Diagnosis not present

## 2021-05-08 DIAGNOSIS — E119 Type 2 diabetes mellitus without complications: Secondary | ICD-10-CM | POA: Diagnosis not present

## 2021-05-09 DIAGNOSIS — E119 Type 2 diabetes mellitus without complications: Secondary | ICD-10-CM | POA: Diagnosis not present

## 2021-05-09 DIAGNOSIS — G20 Parkinson's disease: Secondary | ICD-10-CM | POA: Diagnosis not present

## 2021-05-09 DIAGNOSIS — L89152 Pressure ulcer of sacral region, stage 2: Secondary | ICD-10-CM | POA: Diagnosis not present

## 2021-05-09 DIAGNOSIS — Z7409 Other reduced mobility: Secondary | ICD-10-CM | POA: Diagnosis not present

## 2021-05-09 NOTE — Progress Notes (Signed)
No ICM remote transmission received for 05/06/2021 and next ICM transmission scheduled for 06/18/2021.

## 2021-05-14 DIAGNOSIS — E119 Type 2 diabetes mellitus without complications: Secondary | ICD-10-CM | POA: Diagnosis not present

## 2021-05-14 DIAGNOSIS — E782 Mixed hyperlipidemia: Secondary | ICD-10-CM | POA: Diagnosis not present

## 2021-05-14 DIAGNOSIS — G20 Parkinson's disease: Secondary | ICD-10-CM | POA: Diagnosis not present

## 2021-05-14 DIAGNOSIS — L98429 Non-pressure chronic ulcer of back with unspecified severity: Secondary | ICD-10-CM | POA: Diagnosis not present

## 2021-05-14 DIAGNOSIS — I495 Sick sinus syndrome: Secondary | ICD-10-CM | POA: Diagnosis not present

## 2021-05-14 DIAGNOSIS — K219 Gastro-esophageal reflux disease without esophagitis: Secondary | ICD-10-CM | POA: Diagnosis not present

## 2021-05-14 DIAGNOSIS — I10 Essential (primary) hypertension: Secondary | ICD-10-CM | POA: Diagnosis not present

## 2021-05-15 DIAGNOSIS — R195 Other fecal abnormalities: Secondary | ICD-10-CM | POA: Diagnosis not present

## 2021-05-15 DIAGNOSIS — Z931 Gastrostomy status: Secondary | ICD-10-CM | POA: Diagnosis not present

## 2021-05-16 DIAGNOSIS — Z7409 Other reduced mobility: Secondary | ICD-10-CM | POA: Diagnosis not present

## 2021-05-16 DIAGNOSIS — L89152 Pressure ulcer of sacral region, stage 2: Secondary | ICD-10-CM | POA: Diagnosis not present

## 2021-05-16 DIAGNOSIS — R600 Localized edema: Secondary | ICD-10-CM | POA: Diagnosis not present

## 2021-05-16 DIAGNOSIS — M7989 Other specified soft tissue disorders: Secondary | ICD-10-CM | POA: Diagnosis not present

## 2021-05-16 DIAGNOSIS — E119 Type 2 diabetes mellitus without complications: Secondary | ICD-10-CM | POA: Diagnosis not present

## 2021-05-16 DIAGNOSIS — G20 Parkinson's disease: Secondary | ICD-10-CM | POA: Diagnosis not present

## 2021-05-21 DIAGNOSIS — E119 Type 2 diabetes mellitus without complications: Secondary | ICD-10-CM | POA: Diagnosis not present

## 2021-05-22 DIAGNOSIS — N400 Enlarged prostate without lower urinary tract symptoms: Secondary | ICD-10-CM | POA: Diagnosis not present

## 2021-05-22 DIAGNOSIS — I10 Essential (primary) hypertension: Secondary | ICD-10-CM | POA: Diagnosis not present

## 2021-05-22 DIAGNOSIS — G20 Parkinson's disease: Secondary | ICD-10-CM | POA: Diagnosis not present

## 2021-05-22 DIAGNOSIS — E782 Mixed hyperlipidemia: Secondary | ICD-10-CM | POA: Diagnosis not present

## 2021-05-23 DIAGNOSIS — L89152 Pressure ulcer of sacral region, stage 2: Secondary | ICD-10-CM | POA: Diagnosis not present

## 2021-05-23 DIAGNOSIS — G20 Parkinson's disease: Secondary | ICD-10-CM | POA: Diagnosis not present

## 2021-05-23 DIAGNOSIS — E119 Type 2 diabetes mellitus without complications: Secondary | ICD-10-CM | POA: Diagnosis not present

## 2021-05-23 DIAGNOSIS — Z7409 Other reduced mobility: Secondary | ICD-10-CM | POA: Diagnosis not present

## 2021-05-28 DIAGNOSIS — R6884 Jaw pain: Secondary | ICD-10-CM | POA: Diagnosis not present

## 2021-05-30 DIAGNOSIS — I10 Essential (primary) hypertension: Secondary | ICD-10-CM | POA: Diagnosis not present

## 2021-05-30 DIAGNOSIS — K219 Gastro-esophageal reflux disease without esophagitis: Secondary | ICD-10-CM | POA: Diagnosis not present

## 2021-05-30 DIAGNOSIS — I495 Sick sinus syndrome: Secondary | ICD-10-CM | POA: Diagnosis not present

## 2021-05-30 DIAGNOSIS — E782 Mixed hyperlipidemia: Secondary | ICD-10-CM | POA: Diagnosis not present

## 2021-05-30 DIAGNOSIS — I69354 Hemiplegia and hemiparesis following cerebral infarction affecting left non-dominant side: Secondary | ICD-10-CM | POA: Diagnosis not present

## 2021-05-30 DIAGNOSIS — G20 Parkinson's disease: Secondary | ICD-10-CM | POA: Diagnosis not present

## 2021-05-30 DIAGNOSIS — E119 Type 2 diabetes mellitus without complications: Secondary | ICD-10-CM | POA: Diagnosis not present

## 2021-05-30 DIAGNOSIS — Z7409 Other reduced mobility: Secondary | ICD-10-CM | POA: Diagnosis not present

## 2021-05-30 DIAGNOSIS — I69991 Dysphagia following unspecified cerebrovascular disease: Secondary | ICD-10-CM | POA: Diagnosis not present

## 2021-05-30 DIAGNOSIS — L89152 Pressure ulcer of sacral region, stage 2: Secondary | ICD-10-CM | POA: Diagnosis not present

## 2021-05-30 DIAGNOSIS — N400 Enlarged prostate without lower urinary tract symptoms: Secondary | ICD-10-CM | POA: Diagnosis not present

## 2021-06-01 DIAGNOSIS — R531 Weakness: Secondary | ICD-10-CM | POA: Diagnosis not present

## 2021-06-01 DIAGNOSIS — R062 Wheezing: Secondary | ICD-10-CM | POA: Diagnosis not present

## 2021-06-01 DIAGNOSIS — Z8673 Personal history of transient ischemic attack (TIA), and cerebral infarction without residual deficits: Secondary | ICD-10-CM | POA: Diagnosis not present

## 2021-06-01 DIAGNOSIS — U071 COVID-19: Secondary | ICD-10-CM | POA: Diagnosis not present

## 2021-06-01 DIAGNOSIS — R0902 Hypoxemia: Secondary | ICD-10-CM | POA: Diagnosis not present

## 2021-06-01 DIAGNOSIS — G839 Paralytic syndrome, unspecified: Secondary | ICD-10-CM | POA: Diagnosis not present

## 2021-06-01 DIAGNOSIS — J189 Pneumonia, unspecified organism: Secondary | ICD-10-CM | POA: Diagnosis not present

## 2021-06-01 DIAGNOSIS — M6281 Muscle weakness (generalized): Secondary | ICD-10-CM | POA: Diagnosis not present

## 2021-06-01 DIAGNOSIS — Z20828 Contact with and (suspected) exposure to other viral communicable diseases: Secondary | ICD-10-CM | POA: Diagnosis not present

## 2021-06-02 DIAGNOSIS — B9729 Other coronavirus as the cause of diseases classified elsewhere: Secondary | ICD-10-CM | POA: Diagnosis not present

## 2021-06-02 DIAGNOSIS — Z7401 Bed confinement status: Secondary | ICD-10-CM | POA: Diagnosis not present

## 2021-06-18 ENCOUNTER — Ambulatory Visit (INDEPENDENT_AMBULATORY_CARE_PROVIDER_SITE_OTHER): Payer: Medicare Other

## 2021-06-18 DIAGNOSIS — I5022 Chronic systolic (congestive) heart failure: Secondary | ICD-10-CM

## 2021-06-18 DIAGNOSIS — Z95810 Presence of automatic (implantable) cardiac defibrillator: Secondary | ICD-10-CM

## 2021-06-19 NOTE — Progress Notes (Signed)
EPIC Encounter for ICM Monitoring  Patient Name: JARYD DREW is a 76 y.o. male Date: 06/19/2021 Primary Care Physican: [facility] [place] Primary Cardiologist: Branch Electrophysiologist: Allred Bi-V Pacing: >99%          Last Weight: 229 lbs                                            Transmission reviewed.    Corvue Thoracic impedance suggesting normal fluid levels.   Prescribed: Torsemide 20 mg take 1 tablet by mouth daily as needed (for fluid).   Potassium & Sodium Phosphates (PHOS-NAK)280-160-250 mg Pack Take 1 packet by mouth 2 (two) times daily.   Labs: 04/14/2021 Creatinine 1.11, BUN 29, Potassium 4.4, Sodium 141, GFR 70 Care Everywhere 04/16/19/22 Creatinine 1.13, BUN 28, Potassium 4.1, Sodium 141, GFR 69 Care Everywhere A complete set of results can be found in Results Review.   Recommendations:  No changes.   Follow-up plan: ICM clinic phone appointment on 07/22/2021.   91 day device clinic remote transmission 06/25/2021.    EP/Cardiology Office Visits:  09/20/2021 with Dr Rayann Heman.  Recall 08/18/2021 with Dr Harl Bowie.     Copy of ICM check sent to Dr. Rayann Heman.    3 month ICM trend: 06/18/2021.    12-14 Month ICM trend:     Rosalene Billings, RN 06/19/2021 4:48 PM

## 2021-06-23 DIAGNOSIS — T85528A Displacement of other gastrointestinal prosthetic devices, implants and grafts, initial encounter: Secondary | ICD-10-CM | POA: Diagnosis not present

## 2021-06-23 DIAGNOSIS — R6889 Other general symptoms and signs: Secondary | ICD-10-CM | POA: Diagnosis not present

## 2021-06-23 DIAGNOSIS — Z431 Encounter for attention to gastrostomy: Secondary | ICD-10-CM | POA: Diagnosis not present

## 2021-06-25 ENCOUNTER — Ambulatory Visit (INDEPENDENT_AMBULATORY_CARE_PROVIDER_SITE_OTHER): Payer: Medicare Other

## 2021-06-25 DIAGNOSIS — I429 Cardiomyopathy, unspecified: Secondary | ICD-10-CM | POA: Diagnosis not present

## 2021-06-25 LAB — CUP PACEART REMOTE DEVICE CHECK
Battery Remaining Longevity: 48 mo
Battery Remaining Percentage: 58 %
Battery Voltage: 2.95 V
Brady Statistic AP VP Percent: 1 %
Brady Statistic AP VS Percent: 1 %
Brady Statistic AS VP Percent: 99 %
Brady Statistic AS VS Percent: 1 %
Brady Statistic RA Percent Paced: 1 %
Date Time Interrogation Session: 20230207020035
HighPow Impedance: 56 Ohm
HighPow Impedance: 56 Ohm
Implantable Lead Implant Date: 20200129
Implantable Lead Implant Date: 20200129
Implantable Lead Implant Date: 20200129
Implantable Lead Location: 753858
Implantable Lead Location: 753859
Implantable Lead Location: 753860
Implantable Pulse Generator Implant Date: 20200129
Lead Channel Impedance Value: 1050 Ohm
Lead Channel Impedance Value: 380 Ohm
Lead Channel Impedance Value: 380 Ohm
Lead Channel Pacing Threshold Amplitude: 0.625 V
Lead Channel Pacing Threshold Amplitude: 0.75 V
Lead Channel Pacing Threshold Amplitude: 1.5 V
Lead Channel Pacing Threshold Pulse Width: 0.5 ms
Lead Channel Pacing Threshold Pulse Width: 0.5 ms
Lead Channel Pacing Threshold Pulse Width: 1 ms
Lead Channel Sensing Intrinsic Amplitude: 11.4 mV
Lead Channel Sensing Intrinsic Amplitude: 4.3 mV
Lead Channel Setting Pacing Amplitude: 2 V
Lead Channel Setting Pacing Amplitude: 2 V
Lead Channel Setting Pacing Amplitude: 2 V
Lead Channel Setting Pacing Pulse Width: 0.5 ms
Lead Channel Setting Pacing Pulse Width: 1 ms
Lead Channel Setting Sensing Sensitivity: 0.5 mV
Pulse Gen Serial Number: 9877212

## 2021-06-28 NOTE — Progress Notes (Signed)
Remote ICD transmission.   

## 2021-07-22 ENCOUNTER — Ambulatory Visit (INDEPENDENT_AMBULATORY_CARE_PROVIDER_SITE_OTHER): Payer: Medicare Other

## 2021-07-22 DIAGNOSIS — I5022 Chronic systolic (congestive) heart failure: Secondary | ICD-10-CM

## 2021-07-22 DIAGNOSIS — Z95810 Presence of automatic (implantable) cardiac defibrillator: Secondary | ICD-10-CM | POA: Diagnosis not present

## 2021-07-24 NOTE — Progress Notes (Signed)
EPIC Encounter for ICM Monitoring ? ?Patient Name: Jonathan Floyd is a 76 y.o. male ?Date: 07/24/2021 ?Primary Care Physican: [facility], [place] ?Primary Cardiologist: Branch ?Electrophysiologist: Allred ?Bi-V Pacing: >99%          ?Last Weight: 229 lbs ?  ?                                         ?Spoke with patient and heart failure questions reviewed.  Pt asymptomatic for fluid accumulation.    ?  ?Corvue Thoracic impedance suggesting possible fluid accumulation from 2/26-3/5. ?  ?Prescribed: ?Torsemide 20 mg take 1 tablet by mouth daily as needed (for fluid).   ?Potassium & Sodium Phosphates (PHOS-NAK)280-160-250 mg Pack Take 1 packet by mouth 2 (two) times daily. ?  ?Labs: ?04/14/2021 Creatinine 1.11, BUN 29, Potassium 4.4, Sodium 141, GFR 70 Care Everywhere ?04/16/19/22 Creatinine 1.13, BUN 28, Potassium 4.1, Sodium 141, GFR 69 Care Everywhere ?A complete set of results can be found in Results Review. ?  ?Recommendations:  Advised to take 1 PRN Torsemide x 1 day.   ?  ?Follow-up plan: ICM clinic phone appointment on 08/26/2021.   91 day device clinic remote transmission 09/24/2021.  ?  ?EP/Cardiology Office Visits:  09/27/2021 with Dr Rayann Heman.  Recall 08/18/2021 with Dr Harl Bowie.   ?  ?Copy of ICM check sent to Dr. Rayann Heman.   ? ?3 month ICM trend: 07/22/2021. ? ? ? ?12-14 Month ICM trend:  ? ? ? ?Rosalene Billings, RN ?07/24/2021 ?3:56 PM ? ?

## 2021-07-31 DIAGNOSIS — Z8673 Personal history of transient ischemic attack (TIA), and cerebral infarction without residual deficits: Secondary | ICD-10-CM | POA: Diagnosis not present

## 2021-07-31 DIAGNOSIS — Z7401 Bed confinement status: Secondary | ICD-10-CM | POA: Diagnosis not present

## 2021-07-31 DIAGNOSIS — T85528A Displacement of other gastrointestinal prosthetic devices, implants and grafts, initial encounter: Secondary | ICD-10-CM | POA: Diagnosis not present

## 2021-07-31 DIAGNOSIS — Z20822 Contact with and (suspected) exposure to covid-19: Secondary | ICD-10-CM | POA: Diagnosis not present

## 2021-07-31 DIAGNOSIS — K9423 Gastrostomy malfunction: Secondary | ICD-10-CM | POA: Diagnosis not present

## 2021-07-31 DIAGNOSIS — I10 Essential (primary) hypertension: Secondary | ICD-10-CM | POA: Diagnosis not present

## 2021-07-31 DIAGNOSIS — R059 Cough, unspecified: Secondary | ICD-10-CM | POA: Diagnosis not present

## 2021-07-31 DIAGNOSIS — R0902 Hypoxemia: Secondary | ICD-10-CM | POA: Diagnosis not present

## 2021-08-13 DIAGNOSIS — R109 Unspecified abdominal pain: Secondary | ICD-10-CM | POA: Diagnosis not present

## 2021-08-13 DIAGNOSIS — I10 Essential (primary) hypertension: Secondary | ICD-10-CM | POA: Diagnosis not present

## 2021-08-13 DIAGNOSIS — Z8673 Personal history of transient ischemic attack (TIA), and cerebral infarction without residual deficits: Secondary | ICD-10-CM | POA: Diagnosis not present

## 2021-08-13 DIAGNOSIS — R0902 Hypoxemia: Secondary | ICD-10-CM | POA: Diagnosis not present

## 2021-08-13 DIAGNOSIS — R4182 Altered mental status, unspecified: Secondary | ICD-10-CM | POA: Diagnosis not present

## 2021-08-13 DIAGNOSIS — Z431 Encounter for attention to gastrostomy: Secondary | ICD-10-CM | POA: Diagnosis not present

## 2021-08-13 DIAGNOSIS — R5381 Other malaise: Secondary | ICD-10-CM | POA: Diagnosis not present

## 2021-08-13 DIAGNOSIS — R9082 White matter disease, unspecified: Secondary | ICD-10-CM | POA: Diagnosis not present

## 2021-08-14 DIAGNOSIS — Z7401 Bed confinement status: Secondary | ICD-10-CM | POA: Diagnosis not present

## 2021-08-14 DIAGNOSIS — R531 Weakness: Secondary | ICD-10-CM | POA: Diagnosis not present

## 2021-08-14 DIAGNOSIS — I10 Essential (primary) hypertension: Secondary | ICD-10-CM | POA: Diagnosis not present

## 2021-08-23 ENCOUNTER — Encounter: Payer: Medicare Other | Admitting: Internal Medicine

## 2021-08-26 ENCOUNTER — Ambulatory Visit (INDEPENDENT_AMBULATORY_CARE_PROVIDER_SITE_OTHER): Payer: Medicare Other

## 2021-08-26 DIAGNOSIS — Z95810 Presence of automatic (implantable) cardiac defibrillator: Secondary | ICD-10-CM | POA: Diagnosis not present

## 2021-08-26 DIAGNOSIS — I5022 Chronic systolic (congestive) heart failure: Secondary | ICD-10-CM | POA: Diagnosis not present

## 2021-08-28 ENCOUNTER — Telehealth: Payer: Self-pay

## 2021-08-28 NOTE — Telephone Encounter (Signed)
Remote ICM transmission received.  Attempted call to patient regarding ICM remote transmission and caretaker answered.  Pt was sleeping.  ? ?

## 2021-08-28 NOTE — Progress Notes (Signed)
EPIC Encounter for ICM Monitoring ? ?Patient Name: Jonathan Floyd is a 76 y.o. male ?Date: 08/28/2021 ?Primary Care Physican: [facility], [place] ?Primary Cardiologist: Branch ?Electrophysiologist: Allred ?Bi-V Pacing: >99%          ?Last Weight: 229 lbs ?  ?                                         ?Attempted call to patient and unable to reach.  Caretaker reported he was sleeping.  Transmission reviewed.  ?  ?Corvue Thoracic impedance suggesting normal fluid levels. ?  ?Prescribed: ?Torsemide 20 mg take 1 tablet by mouth daily as needed (for fluid).   ?Potassium & Sodium Phosphates (PHOS-NAK)280-160-250 mg Pack Take 1 packet by mouth 2 (two) times daily. ?  ?Labs: ?04/14/2021 Creatinine 1.11, BUN 29, Potassium 4.4, Sodium 141, GFR 70 Care Everywhere ?04/16/19/22 Creatinine 1.13, BUN 28, Potassium 4.1, Sodium 141, GFR 69 Care Everywhere ?A complete set of results can be found in Results Review. ?  ?Recommendations:  Unable to reach.   ?  ?Follow-up plan: ICM clinic phone appointment on 09/30/2021.   91 day device clinic remote transmission 09/24/2021.  ?  ?EP/Cardiology Office Visits:  09/27/2021 with Dr Rayann Heman.  Recall 08/18/2021 with Dr Harl Bowie.   ?  ?Copy of ICM check sent to Dr. Rayann Heman.  ? ?3 month ICM trend: 08/26/2021. ? ? ? ?12-14 Month ICM trend:  ? ? ? ?Rosalene Billings, RN ?08/28/2021 ?9:12 AM ? ?

## 2021-09-20 ENCOUNTER — Encounter: Payer: No Typology Code available for payment source | Admitting: Internal Medicine

## 2021-09-24 ENCOUNTER — Ambulatory Visit (INDEPENDENT_AMBULATORY_CARE_PROVIDER_SITE_OTHER): Payer: Medicare Other

## 2021-09-24 DIAGNOSIS — I429 Cardiomyopathy, unspecified: Secondary | ICD-10-CM

## 2021-09-24 LAB — CUP PACEART REMOTE DEVICE CHECK
Battery Remaining Longevity: 46 mo
Battery Remaining Percentage: 55 %
Battery Voltage: 2.95 V
Brady Statistic AP VP Percent: 1 %
Brady Statistic AP VS Percent: 1 %
Brady Statistic AS VP Percent: 98 %
Brady Statistic AS VS Percent: 1 %
Brady Statistic RA Percent Paced: 1 %
Date Time Interrogation Session: 20230509020020
HighPow Impedance: 63 Ohm
HighPow Impedance: 63 Ohm
Implantable Lead Implant Date: 20200129
Implantable Lead Implant Date: 20200129
Implantable Lead Implant Date: 20200129
Implantable Lead Location: 753858
Implantable Lead Location: 753859
Implantable Lead Location: 753860
Implantable Pulse Generator Implant Date: 20200129
Lead Channel Impedance Value: 1075 Ohm
Lead Channel Impedance Value: 410 Ohm
Lead Channel Impedance Value: 410 Ohm
Lead Channel Pacing Threshold Amplitude: 0.75 V
Lead Channel Pacing Threshold Amplitude: 0.75 V
Lead Channel Pacing Threshold Amplitude: 1.5 V
Lead Channel Pacing Threshold Pulse Width: 0.5 ms
Lead Channel Pacing Threshold Pulse Width: 0.5 ms
Lead Channel Pacing Threshold Pulse Width: 1 ms
Lead Channel Sensing Intrinsic Amplitude: 11.4 mV
Lead Channel Sensing Intrinsic Amplitude: 4.6 mV
Lead Channel Setting Pacing Amplitude: 2 V
Lead Channel Setting Pacing Amplitude: 2 V
Lead Channel Setting Pacing Amplitude: 2 V
Lead Channel Setting Pacing Pulse Width: 0.5 ms
Lead Channel Setting Pacing Pulse Width: 1 ms
Lead Channel Setting Sensing Sensitivity: 0.5 mV
Pulse Gen Serial Number: 9877212

## 2021-09-27 ENCOUNTER — Encounter: Payer: Self-pay | Admitting: Internal Medicine

## 2021-09-27 ENCOUNTER — Ambulatory Visit (INDEPENDENT_AMBULATORY_CARE_PROVIDER_SITE_OTHER): Payer: No Typology Code available for payment source | Admitting: Internal Medicine

## 2021-09-27 ENCOUNTER — Encounter: Payer: No Typology Code available for payment source | Admitting: Internal Medicine

## 2021-09-27 VITALS — BP 140/82 | HR 76 | Ht 69.0 in | Wt 177.0 lb

## 2021-09-27 DIAGNOSIS — I10 Essential (primary) hypertension: Secondary | ICD-10-CM

## 2021-09-27 DIAGNOSIS — I5020 Unspecified systolic (congestive) heart failure: Secondary | ICD-10-CM

## 2021-09-27 NOTE — Progress Notes (Signed)
? ?PCP: [facility] ?Primary Cardiologist: Dr Harl Bowie ?Primary EP: Dr Rayann Heman ? ?Jonathan Floyd is a 76 y.o. male who presents today for routine electrophysiology followup.  Since last being seen in our clinic, the patient reports doing reasonably well.  He has struggled over the past year.  This seemed to have begun after stroke.  He has made slow recovery.  He has had a number of ER visits over the past few months.  Today, he denies symptoms of palpitations, chest pain, shortness of breath,   dizziness, presyncope, syncope, or ICD shocks.  + edema. The patient is otherwise without complaint today.  ? ?Past Medical History:  ?Diagnosis Date  ? AICD (automatic cardioverter/defibrillator) present 06/16/2018  ? Cancer [facility])   ? 43 years ago- kidney taken out for it  ? Chronic systolic CHF (congestive heart failure) ([place])   ? CKD (chronic kidney disease), stage III ([place])   ? 1 kidney  ? Coronary artery disease 2017  ? DES pLAD, [place]. 2 other stents in 2014  ? Diabetes mellitus without complication ([place])   ? Hypertension   ? Renal insufficiency   ? ?Past Surgical History:  ?Procedure Laterality Date  ? BIV ICD INSERTION CRT-D N/A 06/16/2018  ? Procedure: BIV ICD INSERTION CRT-D;  Surgeon: Constance Haw, MD;  Location: [place] [facility];  Service: Cardiovascular;  Laterality: N/A;  ? CORONARY ANGIOPLASTY WITH STENT PLACEMENT    ? ICD IMPLANT  03/17/2019  ? BIV  ? KNEE ARTHROPLASTY    ? NEPHRECTOMY  1976  ? ? ?ROS- all systems are reviewed and negative except as per HPI above ? ?Current Outpatient Medications  ?Medication Sig Dispense Refill  ? acetaminophen (TYLENOL) 500 MG tablet Take 1,000 mg by mouth every 6 (six) hours as needed for mild pain or headache.     ? amantadine (SYMMETREL) 50 MG/5ML solution Give 10 mLs by tube 2 (two) times daily.    ? amLODipine (NORVASC) 10 MG tablet Take 10 mg by mouth daily.    ? aspirin EC 81 MG tablet Take 81 mg by mouth daily.    ? atorvastatin (LIPITOR) 10  MG tablet Take 10 mg by mouth at bedtime.     ? atorvastatin (LIPITOR) 80 MG tablet Take 80 mg by mouth at bedtime.    ? carbidopa-levodopa (SINEMET IR) 25-100 MG tablet Give 1 tablet by tube 3 (three) times daily.    ? carvedilol (COREG) 25 MG tablet Take 1 tablet (25 mg total) by mouth 2 (two) times daily. 180 tablet 3  ? finasteride (PROSCAR) 5 MG tablet Take 5 mg by mouth daily.    ? nitroGLYCERIN (NITROSTAT) 0.4 MG SL tablet Place 0.4 mg under the tongue every 5 (five) minutes as needed for chest pain.    ? ?No current facility-administered medications for this visit.  ? ? ?Physical Exam: ?Vitals:  ? 09/27/21 1522  ?BP: 140/82  ?Pulse: 76  ?SpO2: 96%  ?Weight: 177 lb (80.3 kg)  ?Height: '5\' 9"'$  (1.753 m)  ? ? ?GEN- The patient is chronically ill appearing, alert and oriented x 3 today.  In a wheelchair ?Head- normocephalic, atraumatic ?Eyes-  Sclera clear, conjunctiva pink ?Ears- hearing intact ?Oropharynx- clear ?Lungs- Clear to ausculation bilaterally, normal work of breathing ?Chest- ICD pocket is well healed ?Heart- Regular rate and rhythm, no murmurs, rubs or gallops, PMI not laterally displaced ?GI- soft, NT, ND, + BS ?Extremities- no clubbing, cyanosis, or edema ? ?ICD interrogation-  reviewed in detail today,  See PACEART report ?  ?Wt Readings from Last 3 Encounters:  ?09/27/21 177 lb (80.3 kg)  ?02/19/21 209 lb 12.8 oz (95.2 kg)  ?09/21/20 220 lb 12.8 oz (100.2 kg)  ? ? ?Assessment and Plan: ? ?1.  Chronic systolic dysfunction/ nonischemic CM ?euvolemic today ?EF has recovered with sinus ?Stable on an appropriate medical regimen ?Normal ICD function ?See Claudia Desanct[employer] Art report ?No changes today ?he is not device dependant today ?followed in ICM device clinic ? ?2. HTN ?Stable ?No change required today ? ?3. CAD ?No ischemic symptoms ?No changes ? ?Risks, benefits and potential toxicities for medications prescribed and/or refilled reviewed with patient today.  ? ?Return in a year ? ?Thompson Grayer MD,  FACC ?09/27/2021 ?3:57 PM ? ?

## 2021-09-27 NOTE — Patient Instructions (Addendum)
Medication Instructions:  ?Your physician recommends that you continue on your current medications as directed. Please refer to the Current Medication list given to you today. ? ?Labwork: ?none ? ?Testing/Procedures: ?none ? ?Follow-Up: ?Your physician recommends that you schedule a follow-up appointment in: 1 year ? ?Any Other Special Instructions Will Be Listed Below (If Applicable). ? ?If you need a refill on your cardiac medications before your next appointment, please call your pharmacy. ?

## 2021-09-30 ENCOUNTER — Ambulatory Visit (INDEPENDENT_AMBULATORY_CARE_PROVIDER_SITE_OTHER): Payer: No Typology Code available for payment source

## 2021-09-30 DIAGNOSIS — I5022 Chronic systolic (congestive) heart failure: Secondary | ICD-10-CM | POA: Diagnosis not present

## 2021-09-30 DIAGNOSIS — Z95810 Presence of automatic (implantable) cardiac defibrillator: Secondary | ICD-10-CM

## 2021-10-02 LAB — CUP PACEART INCLINIC DEVICE CHECK
Battery Remaining Longevity: 45 mo
Brady Statistic RA Percent Paced: 0.77 %
Brady Statistic RV Percent Paced: 99.45 %
Date Time Interrogation Session: 20230512143200
HighPow Impedance: 61.875
Implantable Lead Implant Date: 20200129
Implantable Lead Implant Date: 20200129
Implantable Lead Implant Date: 20200129
Implantable Lead Location: 753858
Implantable Lead Location: 753859
Implantable Lead Location: 753860
Implantable Pulse Generator Implant Date: 20200129
Lead Channel Impedance Value: 1037.5 Ohm
Lead Channel Impedance Value: 387.5 Ohm
Lead Channel Impedance Value: 387.5 Ohm
Lead Channel Pacing Threshold Amplitude: 0.5 V
Lead Channel Pacing Threshold Amplitude: 0.5 V
Lead Channel Pacing Threshold Amplitude: 0.75 V
Lead Channel Pacing Threshold Amplitude: 0.75 V
Lead Channel Pacing Threshold Amplitude: 1.25 V
Lead Channel Pacing Threshold Amplitude: 1.25 V
Lead Channel Pacing Threshold Pulse Width: 0.5 ms
Lead Channel Pacing Threshold Pulse Width: 0.5 ms
Lead Channel Pacing Threshold Pulse Width: 0.5 ms
Lead Channel Pacing Threshold Pulse Width: 0.5 ms
Lead Channel Pacing Threshold Pulse Width: 1 ms
Lead Channel Pacing Threshold Pulse Width: 1 ms
Lead Channel Sensing Intrinsic Amplitude: 11.4 mV
Lead Channel Sensing Intrinsic Amplitude: 4.6 mV
Lead Channel Setting Pacing Amplitude: 2 V
Lead Channel Setting Pacing Amplitude: 2 V
Lead Channel Setting Pacing Amplitude: 2 V
Lead Channel Setting Pacing Pulse Width: 0.5 ms
Lead Channel Setting Pacing Pulse Width: 1 ms
Lead Channel Setting Sensing Sensitivity: 0.5 mV
Pulse Gen Serial Number: 9877212

## 2021-10-04 NOTE — Progress Notes (Signed)
EPIC Encounter for ICM Monitoring  Patient Name: Jonathan Floyd is a 76 y.o. male Date: 10/04/2021 Primary Care Physican: [facility] [place] Primary Cardiologist: Branch Electrophysiologist: Allred Bi-V Pacing: >99%          Last Weight: 229 lbs                                            Defibrillator in office check on 5/12 by Dr Rayann Heman.    Corvue Thoracic impedance suggesting normal fluid levels.   Prescribed: Torsemide 20 mg take 1 tablet by mouth daily.     Labs: 04/14/2021 Creatinine 1.11, BUN 29, Potassium 4.4, Sodium 141, GFR 70 Care Everywhere 04/16/19/22 Creatinine 1.13, BUN 28, Potassium 4.1, Sodium 141, GFR 69 Care Everywhere A complete set of results can be found in Results Review.   Recommendations:  No changes.   Follow-up plan: ICM clinic phone appointment on 11/04/2021.   91 day device clinic remote transmission 12/24/2021.    EP/Cardiology Office Visits:  09/19/2022 with Dr Rayann Heman.  12/17/2021 with Dr Harl Bowie.     Copy of ICM check sent to Dr. Rayann Heman.   3 month ICM trend: 09/30/2021.    12-14 Month ICM trend:     Rosalene Billings, RN 10/04/2021 12:49 PM

## 2021-10-08 NOTE — Progress Notes (Signed)
Remote ICD transmission.   

## 2021-11-04 ENCOUNTER — Ambulatory Visit (INDEPENDENT_AMBULATORY_CARE_PROVIDER_SITE_OTHER): Payer: No Typology Code available for payment source

## 2021-11-04 DIAGNOSIS — I5022 Chronic systolic (congestive) heart failure: Secondary | ICD-10-CM

## 2021-11-04 DIAGNOSIS — Z95810 Presence of automatic (implantable) cardiac defibrillator: Secondary | ICD-10-CM

## 2021-11-06 NOTE — Progress Notes (Addendum)
EPIC Encounter for ICM Monitoring  Patient Name: SIMON LLAMAS is a 76 y.o. male Date: 11/06/2021 Primary Care Physican: [facility], [place] Primary Cardiologist: Branch Electrophysiologist: Allred Bi-V Pacing: >99%          Last Weight: 229 lbs                                            Attempted call to patient and unable to reach.  Caregiver answered phone and pt unavailable.  Wife was at work.  Transmission reviewed.    Corvue Thoracic impedance suggesting normal fluid levels.   Prescribed: Torsemide 20 mg take 1 tablet by mouth daily.     Labs: 04/14/2021 Creatinine 1.11, BUN 29, Potassium 4.4, Sodium 141, GFR 70 Care Everywhere 04/15/2021 Creatinine 1.13, BUN 28, Potassium 4.1, Sodium 141, GFR 69 Care Everywhere A complete set of results can be found in Results Review.   Recommendations: Unable to reach.     Follow-up plan: ICM clinic phone appointment on 12/16/2021.   91 day device clinic remote transmission 12/24/2021.    EP/Cardiology Office Visits: 09/19/2022 with Dr Rayann Heman.  12/17/2021 with Dr Harl Bowie.     Copy of ICM check sent to Dr. Rayann Heman.   3 month ICM trend: 11/04/2021.    12-14 Month ICM trend:     Rosalene Billings, RN 11/06/2021 10:17 AM

## 2021-11-10 DIAGNOSIS — Z4682 Encounter for fitting and adjustment of non-vascular catheter: Secondary | ICD-10-CM | POA: Diagnosis not present

## 2021-11-10 DIAGNOSIS — Y828 Other medical devices associated with adverse incidents: Secondary | ICD-10-CM | POA: Diagnosis not present

## 2021-11-10 DIAGNOSIS — Z431 Encounter for attention to gastrostomy: Secondary | ICD-10-CM | POA: Diagnosis not present

## 2021-11-10 DIAGNOSIS — R369 Urethral discharge, unspecified: Secondary | ICD-10-CM | POA: Diagnosis not present

## 2021-11-10 DIAGNOSIS — Z87891 Personal history of nicotine dependence: Secondary | ICD-10-CM | POA: Diagnosis not present

## 2021-11-10 DIAGNOSIS — R6889 Other general symptoms and signs: Secondary | ICD-10-CM | POA: Diagnosis not present

## 2021-11-13 ENCOUNTER — Encounter: Payer: Self-pay | Admitting: Cardiology

## 2021-11-13 NOTE — Telephone Encounter (Signed)
The patient wife called stating the patient heart rate was high today. I asked her to send a manual transmission for the nurse to review. She states she was going to call the patient and have him send the transmission because she was at work.

## 2021-11-13 NOTE — Telephone Encounter (Signed)
Spoke with patient wife, informed her that I had received the transmission and patient has not had any episodes, patient wife appreciative of call back

## 2021-11-17 DIAGNOSIS — I82432 Acute embolism and thrombosis of left popliteal vein: Secondary | ICD-10-CM | POA: Diagnosis present

## 2021-11-17 DIAGNOSIS — E119 Type 2 diabetes mellitus without complications: Secondary | ICD-10-CM | POA: Diagnosis not present

## 2021-11-17 DIAGNOSIS — N189 Chronic kidney disease, unspecified: Secondary | ICD-10-CM | POA: Diagnosis present

## 2021-11-17 DIAGNOSIS — I517 Cardiomegaly: Secondary | ICD-10-CM | POA: Diagnosis not present

## 2021-11-17 DIAGNOSIS — Z7982 Long term (current) use of aspirin: Secondary | ICD-10-CM | POA: Diagnosis not present

## 2021-11-17 DIAGNOSIS — N390 Urinary tract infection, site not specified: Secondary | ICD-10-CM | POA: Diagnosis not present

## 2021-11-17 DIAGNOSIS — B964 Proteus (mirabilis) (morganii) as the cause of diseases classified elsewhere: Secondary | ICD-10-CM | POA: Diagnosis not present

## 2021-11-17 DIAGNOSIS — I509 Heart failure, unspecified: Secondary | ICD-10-CM | POA: Diagnosis not present

## 2021-11-17 DIAGNOSIS — M6281 Muscle weakness (generalized): Secondary | ICD-10-CM | POA: Diagnosis not present

## 2021-11-17 DIAGNOSIS — E860 Dehydration: Secondary | ICD-10-CM | POA: Diagnosis present

## 2021-11-17 DIAGNOSIS — I999 Unspecified disorder of circulatory system: Secondary | ICD-10-CM | POA: Diagnosis not present

## 2021-11-17 DIAGNOSIS — G9340 Encephalopathy, unspecified: Secondary | ICD-10-CM | POA: Diagnosis not present

## 2021-11-17 DIAGNOSIS — B952 Enterococcus as the cause of diseases classified elsewhere: Secondary | ICD-10-CM | POA: Diagnosis not present

## 2021-11-17 DIAGNOSIS — Z905 Acquired absence of kidney: Secondary | ICD-10-CM | POA: Diagnosis not present

## 2021-11-17 DIAGNOSIS — R220 Localized swelling, mass and lump, head: Secondary | ICD-10-CM | POA: Diagnosis not present

## 2021-11-17 DIAGNOSIS — R531 Weakness: Secondary | ICD-10-CM | POA: Diagnosis not present

## 2021-11-17 DIAGNOSIS — Z950 Presence of cardiac pacemaker: Secondary | ICD-10-CM | POA: Diagnosis not present

## 2021-11-17 DIAGNOSIS — R81 Glycosuria: Secondary | ICD-10-CM | POA: Diagnosis present

## 2021-11-17 DIAGNOSIS — Z20822 Contact with and (suspected) exposure to covid-19: Secondary | ICD-10-CM | POA: Diagnosis present

## 2021-11-17 DIAGNOSIS — I82412 Acute embolism and thrombosis of left femoral vein: Secondary | ICD-10-CM | POA: Diagnosis present

## 2021-11-17 DIAGNOSIS — Z7401 Bed confinement status: Secondary | ICD-10-CM | POA: Diagnosis not present

## 2021-11-17 DIAGNOSIS — I130 Hypertensive heart and chronic kidney disease with heart failure and stage 1 through stage 4 chronic kidney disease, or unspecified chronic kidney disease: Secondary | ICD-10-CM | POA: Diagnosis present

## 2021-11-17 DIAGNOSIS — R279 Unspecified lack of coordination: Secondary | ICD-10-CM | POA: Diagnosis not present

## 2021-11-17 DIAGNOSIS — F0280 Dementia in other diseases classified elsewhere without behavioral disturbance: Secondary | ICD-10-CM | POA: Diagnosis not present

## 2021-11-17 DIAGNOSIS — R41841 Cognitive communication deficit: Secondary | ICD-10-CM | POA: Diagnosis not present

## 2021-11-17 DIAGNOSIS — I110 Hypertensive heart disease with heart failure: Secondary | ICD-10-CM | POA: Diagnosis not present

## 2021-11-17 DIAGNOSIS — I69352 Hemiplegia and hemiparesis following cerebral infarction affecting left dominant side: Secondary | ICD-10-CM | POA: Diagnosis not present

## 2021-11-17 DIAGNOSIS — K9422 Gastrostomy infection: Secondary | ICD-10-CM | POA: Diagnosis present

## 2021-11-17 DIAGNOSIS — I82439 Acute embolism and thrombosis of unspecified popliteal vein: Secondary | ICD-10-CM | POA: Diagnosis not present

## 2021-11-17 DIAGNOSIS — G319 Degenerative disease of nervous system, unspecified: Secondary | ICD-10-CM | POA: Diagnosis not present

## 2021-11-17 DIAGNOSIS — R2689 Other abnormalities of gait and mobility: Secondary | ICD-10-CM | POA: Diagnosis not present

## 2021-11-17 DIAGNOSIS — I5032 Chronic diastolic (congestive) heart failure: Secondary | ICD-10-CM | POA: Diagnosis present

## 2021-11-17 DIAGNOSIS — I6782 Cerebral ischemia: Secondary | ICD-10-CM | POA: Diagnosis not present

## 2021-11-17 DIAGNOSIS — I639 Cerebral infarction, unspecified: Secondary | ICD-10-CM | POA: Diagnosis not present

## 2021-11-17 DIAGNOSIS — Z79899 Other long term (current) drug therapy: Secondary | ICD-10-CM | POA: Diagnosis not present

## 2021-11-17 DIAGNOSIS — R918 Other nonspecific abnormal finding of lung field: Secondary | ICD-10-CM | POA: Diagnosis not present

## 2021-11-17 DIAGNOSIS — B9689 Other specified bacterial agents as the cause of diseases classified elsewhere: Secondary | ICD-10-CM | POA: Diagnosis not present

## 2021-11-17 DIAGNOSIS — I6381 Other cerebral infarction due to occlusion or stenosis of small artery: Secondary | ICD-10-CM | POA: Diagnosis not present

## 2021-11-17 DIAGNOSIS — E1122 Type 2 diabetes mellitus with diabetic chronic kidney disease: Secondary | ICD-10-CM | POA: Diagnosis present

## 2021-11-17 DIAGNOSIS — Z86718 Personal history of other venous thrombosis and embolism: Secondary | ICD-10-CM | POA: Diagnosis not present

## 2021-11-17 DIAGNOSIS — Z792 Long term (current) use of antibiotics: Secondary | ICD-10-CM | POA: Diagnosis not present

## 2021-11-17 DIAGNOSIS — K9420 Gastrostomy complication, unspecified: Secondary | ICD-10-CM | POA: Diagnosis not present

## 2021-11-17 DIAGNOSIS — I82402 Acute embolism and thrombosis of unspecified deep veins of left lower extremity: Secondary | ICD-10-CM | POA: Diagnosis not present

## 2021-11-17 DIAGNOSIS — I82429 Acute embolism and thrombosis of unspecified iliac vein: Secondary | ICD-10-CM | POA: Diagnosis not present

## 2021-11-17 DIAGNOSIS — Z7901 Long term (current) use of anticoagulants: Secondary | ICD-10-CM | POA: Diagnosis not present

## 2021-11-17 DIAGNOSIS — Z87891 Personal history of nicotine dependence: Secondary | ICD-10-CM | POA: Diagnosis not present

## 2021-11-17 DIAGNOSIS — G20 Parkinson's disease: Secondary | ICD-10-CM | POA: Diagnosis not present

## 2021-11-17 DIAGNOSIS — I350 Nonrheumatic aortic (valve) stenosis: Secondary | ICD-10-CM | POA: Diagnosis not present

## 2021-11-17 DIAGNOSIS — R54 Age-related physical debility: Secondary | ICD-10-CM | POA: Diagnosis present

## 2021-11-17 DIAGNOSIS — Z8673 Personal history of transient ischemic attack (TIA), and cerebral infarction without residual deficits: Secondary | ICD-10-CM | POA: Diagnosis not present

## 2021-11-17 DIAGNOSIS — R410 Disorientation, unspecified: Secondary | ICD-10-CM | POA: Diagnosis not present

## 2021-11-17 DIAGNOSIS — R7881 Bacteremia: Secondary | ICD-10-CM | POA: Diagnosis not present

## 2021-11-17 DIAGNOSIS — E1142 Type 2 diabetes mellitus with diabetic polyneuropathy: Secondary | ICD-10-CM | POA: Diagnosis not present

## 2021-11-17 DIAGNOSIS — Z85528 Personal history of other malignant neoplasm of kidney: Secondary | ICD-10-CM | POA: Diagnosis not present

## 2021-11-17 DIAGNOSIS — R5381 Other malaise: Secondary | ICD-10-CM | POA: Diagnosis not present

## 2021-11-17 DIAGNOSIS — G9341 Metabolic encephalopathy: Secondary | ICD-10-CM | POA: Diagnosis present

## 2021-11-17 DIAGNOSIS — E876 Hypokalemia: Secondary | ICD-10-CM | POA: Diagnosis present

## 2021-11-17 DIAGNOSIS — I10 Essential (primary) hypertension: Secondary | ICD-10-CM | POA: Diagnosis not present

## 2021-11-18 ENCOUNTER — Telehealth: Payer: Self-pay

## 2021-11-18 DIAGNOSIS — I110 Hypertensive heart disease with heart failure: Secondary | ICD-10-CM | POA: Diagnosis not present

## 2021-11-18 DIAGNOSIS — Z905 Acquired absence of kidney: Secondary | ICD-10-CM | POA: Diagnosis not present

## 2021-11-18 DIAGNOSIS — Z79899 Other long term (current) drug therapy: Secondary | ICD-10-CM | POA: Diagnosis not present

## 2021-11-18 DIAGNOSIS — I82402 Acute embolism and thrombosis of unspecified deep veins of left lower extremity: Secondary | ICD-10-CM | POA: Diagnosis not present

## 2021-11-18 DIAGNOSIS — Z8673 Personal history of transient ischemic attack (TIA), and cerebral infarction without residual deficits: Secondary | ICD-10-CM | POA: Diagnosis not present

## 2021-11-18 DIAGNOSIS — Z85528 Personal history of other malignant neoplasm of kidney: Secondary | ICD-10-CM | POA: Diagnosis not present

## 2021-11-18 DIAGNOSIS — I509 Heart failure, unspecified: Secondary | ICD-10-CM | POA: Diagnosis not present

## 2021-11-18 DIAGNOSIS — I82439 Acute embolism and thrombosis of unspecified popliteal vein: Secondary | ICD-10-CM | POA: Diagnosis not present

## 2021-11-18 DIAGNOSIS — I82412 Acute embolism and thrombosis of left femoral vein: Secondary | ICD-10-CM | POA: Diagnosis not present

## 2021-11-18 NOTE — Telephone Encounter (Signed)
I spoke with the Nurse Maudie Mercury at [facility] and let her know he has a BIV defib with [employer].

## 2021-11-19 DIAGNOSIS — I639 Cerebral infarction, unspecified: Secondary | ICD-10-CM | POA: Diagnosis not present

## 2021-11-19 DIAGNOSIS — E119 Type 2 diabetes mellitus without complications: Secondary | ICD-10-CM | POA: Diagnosis not present

## 2021-11-19 DIAGNOSIS — B9689 Other specified bacterial agents as the cause of diseases classified elsewhere: Secondary | ICD-10-CM | POA: Diagnosis not present

## 2021-11-19 DIAGNOSIS — G9341 Metabolic encephalopathy: Secondary | ICD-10-CM | POA: Diagnosis not present

## 2021-11-19 DIAGNOSIS — G20 Parkinson's disease: Secondary | ICD-10-CM | POA: Diagnosis not present

## 2021-11-19 DIAGNOSIS — F0280 Dementia in other diseases classified elsewhere without behavioral disturbance: Secondary | ICD-10-CM | POA: Diagnosis not present

## 2021-11-19 DIAGNOSIS — R7881 Bacteremia: Secondary | ICD-10-CM | POA: Diagnosis not present

## 2021-11-19 DIAGNOSIS — I999 Unspecified disorder of circulatory system: Secondary | ICD-10-CM | POA: Diagnosis not present

## 2021-11-19 DIAGNOSIS — I509 Heart failure, unspecified: Secondary | ICD-10-CM | POA: Diagnosis not present

## 2021-11-19 DIAGNOSIS — I110 Hypertensive heart disease with heart failure: Secondary | ICD-10-CM | POA: Diagnosis not present

## 2021-11-19 DIAGNOSIS — B964 Proteus (mirabilis) (morganii) as the cause of diseases classified elsewhere: Secondary | ICD-10-CM | POA: Diagnosis not present

## 2021-11-19 DIAGNOSIS — B952 Enterococcus as the cause of diseases classified elsewhere: Secondary | ICD-10-CM | POA: Diagnosis not present

## 2021-11-20 DIAGNOSIS — I82429 Acute embolism and thrombosis of unspecified iliac vein: Secondary | ICD-10-CM | POA: Diagnosis not present

## 2021-11-20 DIAGNOSIS — I82402 Acute embolism and thrombosis of unspecified deep veins of left lower extremity: Secondary | ICD-10-CM | POA: Diagnosis not present

## 2021-11-20 DIAGNOSIS — I82412 Acute embolism and thrombosis of left femoral vein: Secondary | ICD-10-CM | POA: Diagnosis present

## 2021-11-20 DIAGNOSIS — Z20822 Contact with and (suspected) exposure to covid-19: Secondary | ICD-10-CM | POA: Diagnosis present

## 2021-11-20 DIAGNOSIS — E1122 Type 2 diabetes mellitus with diabetic chronic kidney disease: Secondary | ICD-10-CM | POA: Diagnosis present

## 2021-11-20 DIAGNOSIS — R410 Disorientation, unspecified: Secondary | ICD-10-CM | POA: Diagnosis not present

## 2021-11-20 DIAGNOSIS — Z87891 Personal history of nicotine dependence: Secondary | ICD-10-CM | POA: Diagnosis not present

## 2021-11-20 DIAGNOSIS — I517 Cardiomegaly: Secondary | ICD-10-CM | POA: Diagnosis not present

## 2021-11-20 DIAGNOSIS — Z7901 Long term (current) use of anticoagulants: Secondary | ICD-10-CM | POA: Diagnosis not present

## 2021-11-20 DIAGNOSIS — B952 Enterococcus as the cause of diseases classified elsewhere: Secondary | ICD-10-CM | POA: Diagnosis present

## 2021-11-20 DIAGNOSIS — R531 Weakness: Secondary | ICD-10-CM | POA: Diagnosis not present

## 2021-11-20 DIAGNOSIS — G9340 Encephalopathy, unspecified: Secondary | ICD-10-CM | POA: Diagnosis not present

## 2021-11-20 DIAGNOSIS — R41841 Cognitive communication deficit: Secondary | ICD-10-CM | POA: Diagnosis not present

## 2021-11-20 DIAGNOSIS — Z950 Presence of cardiac pacemaker: Secondary | ICD-10-CM | POA: Diagnosis not present

## 2021-11-20 DIAGNOSIS — K9420 Gastrostomy complication, unspecified: Secondary | ICD-10-CM | POA: Diagnosis not present

## 2021-11-20 DIAGNOSIS — Z85528 Personal history of other malignant neoplasm of kidney: Secondary | ICD-10-CM | POA: Diagnosis not present

## 2021-11-20 DIAGNOSIS — I110 Hypertensive heart disease with heart failure: Secondary | ICD-10-CM | POA: Diagnosis not present

## 2021-11-20 DIAGNOSIS — I5032 Chronic diastolic (congestive) heart failure: Secondary | ICD-10-CM | POA: Diagnosis present

## 2021-11-20 DIAGNOSIS — Z79899 Other long term (current) drug therapy: Secondary | ICD-10-CM | POA: Diagnosis not present

## 2021-11-20 DIAGNOSIS — R54 Age-related physical debility: Secondary | ICD-10-CM | POA: Diagnosis present

## 2021-11-20 DIAGNOSIS — I130 Hypertensive heart and chronic kidney disease with heart failure and stage 1 through stage 4 chronic kidney disease, or unspecified chronic kidney disease: Secondary | ICD-10-CM | POA: Diagnosis present

## 2021-11-20 DIAGNOSIS — E860 Dehydration: Secondary | ICD-10-CM | POA: Diagnosis present

## 2021-11-20 DIAGNOSIS — Z8673 Personal history of transient ischemic attack (TIA), and cerebral infarction without residual deficits: Secondary | ICD-10-CM | POA: Diagnosis not present

## 2021-11-20 DIAGNOSIS — R2689 Other abnormalities of gait and mobility: Secondary | ICD-10-CM | POA: Diagnosis not present

## 2021-11-20 DIAGNOSIS — E119 Type 2 diabetes mellitus without complications: Secondary | ICD-10-CM | POA: Diagnosis not present

## 2021-11-20 DIAGNOSIS — Z7401 Bed confinement status: Secondary | ICD-10-CM | POA: Diagnosis not present

## 2021-11-20 DIAGNOSIS — R5381 Other malaise: Secondary | ICD-10-CM | POA: Diagnosis not present

## 2021-11-20 DIAGNOSIS — R81 Glycosuria: Secondary | ICD-10-CM | POA: Diagnosis present

## 2021-11-20 DIAGNOSIS — N189 Chronic kidney disease, unspecified: Secondary | ICD-10-CM | POA: Diagnosis present

## 2021-11-20 DIAGNOSIS — E876 Hypokalemia: Secondary | ICD-10-CM | POA: Diagnosis present

## 2021-11-20 DIAGNOSIS — R279 Unspecified lack of coordination: Secondary | ICD-10-CM | POA: Diagnosis not present

## 2021-11-20 DIAGNOSIS — I10 Essential (primary) hypertension: Secondary | ICD-10-CM | POA: Diagnosis not present

## 2021-11-20 DIAGNOSIS — Z7982 Long term (current) use of aspirin: Secondary | ICD-10-CM | POA: Diagnosis not present

## 2021-11-20 DIAGNOSIS — K9422 Gastrostomy infection: Secondary | ICD-10-CM | POA: Diagnosis present

## 2021-11-20 DIAGNOSIS — G20 Parkinson's disease: Secondary | ICD-10-CM | POA: Diagnosis present

## 2021-11-20 DIAGNOSIS — M6281 Muscle weakness (generalized): Secondary | ICD-10-CM | POA: Diagnosis not present

## 2021-11-20 DIAGNOSIS — F0280 Dementia in other diseases classified elsewhere without behavioral disturbance: Secondary | ICD-10-CM | POA: Diagnosis not present

## 2021-11-20 DIAGNOSIS — E1142 Type 2 diabetes mellitus with diabetic polyneuropathy: Secondary | ICD-10-CM | POA: Diagnosis not present

## 2021-11-20 DIAGNOSIS — Z792 Long term (current) use of antibiotics: Secondary | ICD-10-CM | POA: Diagnosis not present

## 2021-11-20 DIAGNOSIS — I350 Nonrheumatic aortic (valve) stenosis: Secondary | ICD-10-CM | POA: Diagnosis not present

## 2021-11-20 DIAGNOSIS — B9689 Other specified bacterial agents as the cause of diseases classified elsewhere: Secondary | ICD-10-CM | POA: Diagnosis not present

## 2021-11-20 DIAGNOSIS — I82432 Acute embolism and thrombosis of left popliteal vein: Secondary | ICD-10-CM | POA: Diagnosis present

## 2021-11-20 DIAGNOSIS — G9341 Metabolic encephalopathy: Secondary | ICD-10-CM | POA: Diagnosis present

## 2021-11-20 DIAGNOSIS — B964 Proteus (mirabilis) (morganii) as the cause of diseases classified elsewhere: Secondary | ICD-10-CM | POA: Diagnosis present

## 2021-11-20 DIAGNOSIS — I69352 Hemiplegia and hemiparesis following cerebral infarction affecting left dominant side: Secondary | ICD-10-CM | POA: Diagnosis not present

## 2021-11-20 DIAGNOSIS — Z86718 Personal history of other venous thrombosis and embolism: Secondary | ICD-10-CM | POA: Diagnosis not present

## 2021-11-21 DIAGNOSIS — F0280 Dementia in other diseases classified elsewhere without behavioral disturbance: Secondary | ICD-10-CM | POA: Diagnosis not present

## 2021-11-21 DIAGNOSIS — E876 Hypokalemia: Secondary | ICD-10-CM | POA: Diagnosis not present

## 2021-11-21 DIAGNOSIS — I5032 Chronic diastolic (congestive) heart failure: Secondary | ICD-10-CM | POA: Diagnosis not present

## 2021-11-21 DIAGNOSIS — I110 Hypertensive heart disease with heart failure: Secondary | ICD-10-CM | POA: Diagnosis not present

## 2021-11-21 DIAGNOSIS — K9422 Gastrostomy infection: Secondary | ICD-10-CM | POA: Diagnosis not present

## 2021-11-21 DIAGNOSIS — E119 Type 2 diabetes mellitus without complications: Secondary | ICD-10-CM | POA: Diagnosis not present

## 2021-11-21 DIAGNOSIS — Z79899 Other long term (current) drug therapy: Secondary | ICD-10-CM | POA: Diagnosis not present

## 2021-11-21 DIAGNOSIS — G20 Parkinson's disease: Secondary | ICD-10-CM | POA: Diagnosis not present

## 2021-11-21 DIAGNOSIS — B9689 Other specified bacterial agents as the cause of diseases classified elsewhere: Secondary | ICD-10-CM | POA: Diagnosis not present

## 2021-11-21 DIAGNOSIS — R531 Weakness: Secondary | ICD-10-CM | POA: Diagnosis not present

## 2021-11-21 DIAGNOSIS — I82402 Acute embolism and thrombosis of unspecified deep veins of left lower extremity: Secondary | ICD-10-CM | POA: Diagnosis not present

## 2021-11-21 DIAGNOSIS — B964 Proteus (mirabilis) (morganii) as the cause of diseases classified elsewhere: Secondary | ICD-10-CM | POA: Diagnosis not present

## 2021-11-22 DIAGNOSIS — F0280 Dementia in other diseases classified elsewhere without behavioral disturbance: Secondary | ICD-10-CM | POA: Diagnosis not present

## 2021-11-22 DIAGNOSIS — E876 Hypokalemia: Secondary | ICD-10-CM | POA: Diagnosis not present

## 2021-11-22 DIAGNOSIS — E119 Type 2 diabetes mellitus without complications: Secondary | ICD-10-CM | POA: Diagnosis not present

## 2021-11-22 DIAGNOSIS — K9422 Gastrostomy infection: Secondary | ICD-10-CM | POA: Diagnosis not present

## 2021-11-22 DIAGNOSIS — R5381 Other malaise: Secondary | ICD-10-CM | POA: Diagnosis not present

## 2021-11-22 DIAGNOSIS — Z792 Long term (current) use of antibiotics: Secondary | ICD-10-CM | POA: Diagnosis not present

## 2021-11-22 DIAGNOSIS — I82402 Acute embolism and thrombosis of unspecified deep veins of left lower extremity: Secondary | ICD-10-CM | POA: Diagnosis not present

## 2021-11-22 DIAGNOSIS — G20 Parkinson's disease: Secondary | ICD-10-CM | POA: Diagnosis not present

## 2021-11-22 DIAGNOSIS — I110 Hypertensive heart disease with heart failure: Secondary | ICD-10-CM | POA: Diagnosis not present

## 2021-11-22 DIAGNOSIS — I5032 Chronic diastolic (congestive) heart failure: Secondary | ICD-10-CM | POA: Diagnosis not present

## 2021-11-22 DIAGNOSIS — B9689 Other specified bacterial agents as the cause of diseases classified elsewhere: Secondary | ICD-10-CM | POA: Diagnosis not present

## 2021-11-22 DIAGNOSIS — B964 Proteus (mirabilis) (morganii) as the cause of diseases classified elsewhere: Secondary | ICD-10-CM | POA: Diagnosis not present

## 2021-11-23 DIAGNOSIS — Z79899 Other long term (current) drug therapy: Secondary | ICD-10-CM | POA: Diagnosis not present

## 2021-11-23 DIAGNOSIS — R5381 Other malaise: Secondary | ICD-10-CM | POA: Diagnosis not present

## 2021-11-23 DIAGNOSIS — E876 Hypokalemia: Secondary | ICD-10-CM | POA: Diagnosis not present

## 2021-11-23 DIAGNOSIS — F0280 Dementia in other diseases classified elsewhere without behavioral disturbance: Secondary | ICD-10-CM | POA: Diagnosis not present

## 2021-11-23 DIAGNOSIS — I82402 Acute embolism and thrombosis of unspecified deep veins of left lower extremity: Secondary | ICD-10-CM | POA: Diagnosis not present

## 2021-11-23 DIAGNOSIS — Z8673 Personal history of transient ischemic attack (TIA), and cerebral infarction without residual deficits: Secondary | ICD-10-CM | POA: Diagnosis not present

## 2021-11-23 DIAGNOSIS — Z792 Long term (current) use of antibiotics: Secondary | ICD-10-CM | POA: Diagnosis not present

## 2021-11-23 DIAGNOSIS — K9422 Gastrostomy infection: Secondary | ICD-10-CM | POA: Diagnosis not present

## 2021-11-23 DIAGNOSIS — E119 Type 2 diabetes mellitus without complications: Secondary | ICD-10-CM | POA: Diagnosis not present

## 2021-11-23 DIAGNOSIS — I5032 Chronic diastolic (congestive) heart failure: Secondary | ICD-10-CM | POA: Diagnosis not present

## 2021-11-23 DIAGNOSIS — I110 Hypertensive heart disease with heart failure: Secondary | ICD-10-CM | POA: Diagnosis not present

## 2021-11-23 DIAGNOSIS — G20 Parkinson's disease: Secondary | ICD-10-CM | POA: Diagnosis not present

## 2021-11-24 DIAGNOSIS — G20 Parkinson's disease: Secondary | ICD-10-CM | POA: Diagnosis not present

## 2021-11-24 DIAGNOSIS — I110 Hypertensive heart disease with heart failure: Secondary | ICD-10-CM | POA: Diagnosis not present

## 2021-11-24 DIAGNOSIS — F0280 Dementia in other diseases classified elsewhere without behavioral disturbance: Secondary | ICD-10-CM | POA: Diagnosis not present

## 2021-11-24 DIAGNOSIS — R5381 Other malaise: Secondary | ICD-10-CM | POA: Diagnosis not present

## 2021-11-24 DIAGNOSIS — E119 Type 2 diabetes mellitus without complications: Secondary | ICD-10-CM | POA: Diagnosis not present

## 2021-11-24 DIAGNOSIS — E876 Hypokalemia: Secondary | ICD-10-CM | POA: Diagnosis not present

## 2021-11-24 DIAGNOSIS — B9689 Other specified bacterial agents as the cause of diseases classified elsewhere: Secondary | ICD-10-CM | POA: Diagnosis not present

## 2021-11-24 DIAGNOSIS — K9422 Gastrostomy infection: Secondary | ICD-10-CM | POA: Diagnosis not present

## 2021-11-24 DIAGNOSIS — I82402 Acute embolism and thrombosis of unspecified deep veins of left lower extremity: Secondary | ICD-10-CM | POA: Diagnosis not present

## 2021-11-24 DIAGNOSIS — B964 Proteus (mirabilis) (morganii) as the cause of diseases classified elsewhere: Secondary | ICD-10-CM | POA: Diagnosis not present

## 2021-11-24 DIAGNOSIS — I5032 Chronic diastolic (congestive) heart failure: Secondary | ICD-10-CM | POA: Diagnosis not present

## 2021-11-24 DIAGNOSIS — R531 Weakness: Secondary | ICD-10-CM | POA: Diagnosis not present

## 2021-11-26 DIAGNOSIS — R41841 Cognitive communication deficit: Secondary | ICD-10-CM | POA: Diagnosis not present

## 2021-11-26 DIAGNOSIS — Z8673 Personal history of transient ischemic attack (TIA), and cerebral infarction without residual deficits: Secondary | ICD-10-CM | POA: Diagnosis not present

## 2021-11-26 DIAGNOSIS — R410 Disorientation, unspecified: Secondary | ICD-10-CM | POA: Diagnosis not present

## 2021-11-26 DIAGNOSIS — I82402 Acute embolism and thrombosis of unspecified deep veins of left lower extremity: Secondary | ICD-10-CM | POA: Diagnosis not present

## 2021-11-26 DIAGNOSIS — E782 Mixed hyperlipidemia: Secondary | ICD-10-CM | POA: Diagnosis not present

## 2021-11-26 DIAGNOSIS — E876 Hypokalemia: Secondary | ICD-10-CM | POA: Diagnosis not present

## 2021-11-26 DIAGNOSIS — Z7901 Long term (current) use of anticoagulants: Secondary | ICD-10-CM | POA: Diagnosis not present

## 2021-11-26 DIAGNOSIS — I5032 Chronic diastolic (congestive) heart failure: Secondary | ICD-10-CM | POA: Diagnosis not present

## 2021-11-26 DIAGNOSIS — I2510 Atherosclerotic heart disease of native coronary artery without angina pectoris: Secondary | ICD-10-CM | POA: Diagnosis not present

## 2021-11-26 DIAGNOSIS — R2689 Other abnormalities of gait and mobility: Secondary | ICD-10-CM | POA: Diagnosis not present

## 2021-11-26 DIAGNOSIS — I10 Essential (primary) hypertension: Secondary | ICD-10-CM | POA: Diagnosis not present

## 2021-11-26 DIAGNOSIS — E119 Type 2 diabetes mellitus without complications: Secondary | ICD-10-CM | POA: Diagnosis not present

## 2021-11-26 DIAGNOSIS — G9340 Encephalopathy, unspecified: Secondary | ICD-10-CM | POA: Diagnosis not present

## 2021-11-26 DIAGNOSIS — Z7401 Bed confinement status: Secondary | ICD-10-CM | POA: Diagnosis not present

## 2021-11-26 DIAGNOSIS — R279 Unspecified lack of coordination: Secondary | ICD-10-CM | POA: Diagnosis not present

## 2021-11-26 DIAGNOSIS — I69352 Hemiplegia and hemiparesis following cerebral infarction affecting left dominant side: Secondary | ICD-10-CM | POA: Diagnosis not present

## 2021-11-26 DIAGNOSIS — G20 Parkinson's disease: Secondary | ICD-10-CM | POA: Diagnosis not present

## 2021-11-26 DIAGNOSIS — R109 Unspecified abdominal pain: Secondary | ICD-10-CM | POA: Diagnosis not present

## 2021-11-26 DIAGNOSIS — I5022 Chronic systolic (congestive) heart failure: Secondary | ICD-10-CM | POA: Diagnosis not present

## 2021-11-26 DIAGNOSIS — E1142 Type 2 diabetes mellitus with diabetic polyneuropathy: Secondary | ICD-10-CM | POA: Diagnosis not present

## 2021-11-26 DIAGNOSIS — M6281 Muscle weakness (generalized): Secondary | ICD-10-CM | POA: Diagnosis not present

## 2021-12-16 ENCOUNTER — Ambulatory Visit: Payer: Medicare Other

## 2021-12-16 DIAGNOSIS — Z95810 Presence of automatic (implantable) cardiac defibrillator: Secondary | ICD-10-CM

## 2021-12-16 DIAGNOSIS — I5022 Chronic systolic (congestive) heart failure: Secondary | ICD-10-CM

## 2021-12-17 ENCOUNTER — Ambulatory Visit (INDEPENDENT_AMBULATORY_CARE_PROVIDER_SITE_OTHER): Payer: No Typology Code available for payment source | Admitting: Cardiology

## 2021-12-17 ENCOUNTER — Encounter: Payer: Self-pay | Admitting: Cardiology

## 2021-12-17 VITALS — BP 110/82 | HR 76

## 2021-12-17 DIAGNOSIS — I5022 Chronic systolic (congestive) heart failure: Secondary | ICD-10-CM

## 2021-12-17 DIAGNOSIS — I2510 Atherosclerotic heart disease of native coronary artery without angina pectoris: Secondary | ICD-10-CM

## 2021-12-17 DIAGNOSIS — I10 Essential (primary) hypertension: Secondary | ICD-10-CM

## 2021-12-17 DIAGNOSIS — E782 Mixed hyperlipidemia: Secondary | ICD-10-CM | POA: Diagnosis not present

## 2021-12-17 NOTE — Progress Notes (Signed)
Clinical Summary Mr. Ro is a 76 y.o.male seen today for follow up of the following medical problems.      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  02/2019 echo: LVEF 50-55%, grade I DDX,       - chronic SOB that is intermittent - no recent edema. Takes toresmide prn, takes about 2 times a month - 12/2020 normal device check  11/2021 echo [facility]: LVEF 65-70%,  - no recent, no SOB/DOE - compliant with meds     2. History of CAD - history of prior cardiac stents done through [place]. Last in 2017.  - he has a stent card: Nov 2017 xience to prox LAD - EKG with chronic LBBB   -02/2018 nuclear stress large scar as reported below, no significant ischemia.    - denies any recent chest pains     3. HTN - he is compliant with meds     4. Hyperlipidemia - labs followed by [facility]   02/2019 LDL 74 - compliant with meds   5. CKD III - history of nephrectomy.  Followed by  nephrology Dr Theador Hawthorne     6. History of CVA - admitted to [facility] 03/2021 - discharged to rehab  7. PEG tube - recent infection, had to be removed   8. Aortic stenosis - 11/2021 [facility] mild to mod AS mean 11, no area was reported    9. DVT -11/20/21 [place] left DVT - on eliquis   SH: fan of [employer] Past Medical History:  Diagnosis Date   AICD (automatic cardioverter/defibrillator) present 06/16/2018   Cancer ([place])    43 years ago- kidney taken out for it   Chronic systolic CHF (congestive heart failure) ([place])    CKD (chronic kidney disease), stage III ([place])    1 kidney   Coronary artery disease 2017   DES pLAD, [place]. 2 other stents in 2014   Diabetes mellitus without complication (HCC)    Hypertension    Renal insufficiency      Allergies  Allergen Reactions   Iodinated Contrast Media Other (See Comments)    Kidney problems   Iodine Other (See Comments)    Only has one kidney     Current  Outpatient Medications  Medication Sig Dispense Refill   acetaminophen (TYLENOL) 500 MG tablet Take 1,000 mg by mouth every 6 (six) hours as needed for mild pain or headache.      amantadine (SYMMETREL) 50 MG/5ML solution Give 10 mLs by tube 2 (two) times daily.     amLODipine (NORVASC) 10 MG tablet Take 10 mg by mouth daily.     aspirin EC 81 MG tablet Take 81 mg by mouth daily.     atorvastatin (LIPITOR) 80 MG tablet Take 80 mg by mouth at bedtime.     carbidopa-levodopa (SINEMET IR) 25-100 MG tablet Give 1 tablet by tube 3 (three) times daily.     carvedilol (COREG) 25 MG tablet Take 1 tablet (25 mg total) by mouth 2 (two) times daily. 180 tablet 3   empagliflozin (JARDIANCE) 25 MG TABS tablet Take 12.5 mg by mouth daily.     finasteride (PROSCAR) 5 MG tablet Take 5 mg by mouth daily.     lisinopril (ZESTRIL) 5 MG tablet Take 2.5 mg by mouth daily.     nitroGLYCERIN (NITROSTAT) 0.4 MG SL tablet Place 0.4 mg under the tongue  every 5 (five) minutes as needed for chest pain.     torsemide (DEMADEX) 20 MG tablet Take 20 mg by mouth daily.     No current facility-administered medications for this visit.     Past Surgical History:  Procedure Laterality Date   BIV ICD INSERTION CRT-D N/A 06/16/2018   Procedure: BIV ICD INSERTION CRT-D;  Surgeon: Constance Haw, MD;  Location: [place] [facility];  Service: Cardiovascular;  Laterality: N/A;   CORONARY ANGIOPLASTY WITH STENT PLACEMENT     ICD IMPLANT  03/17/2019   BIV   KNEE ARTHROPLASTY     NEPHRECTOMY  1976     Allergies  Allergen Reactions   Iodinated Contrast Media Other (See Comments)    Kidney problems   Iodine Other (See Comments)    Only has one kidney      Family History  Problem Relation Age of Onset   Dementia Mother    Diabetes Mother    Heart attack Father    Heart disease Father    Hypertension Father      Social History Mr. Lish reports that he quit smoking about 34 years ago. His smoking use  included cigarettes. He has never used smokeless tobacco. Mr. Koike reports current alcohol use.   Review of Systems CONSTITUTIONAL: No weight loss, fever, chills, weakness or fatigue.  HEENT: Eyes: No visual loss, blurred vision, double vision or yellow sclerae.No hearing loss, sneezing, congestion, runny nose or sore throat.  SKIN: No rash or itching.  CARDIOVASCULAR: per hpi RESPIRATORY:per hpi GASTROINTESTINAL: No anorexia, nausea, vomiting or diarrhea. No abdominal pain or blood.  GENITOURINARY: No burning on urination, no polyuria NEUROLOGICAL: No headache, dizziness, syncope, paralysis, ataxia, numbness or tingling in the extremities. No change in bowel or bladder control.  MUSCULOSKELETAL: No muscle, back pain, joint pain or stiffness.  LYMPHATICS: No enlarged nodes. No history of splenectomy.  PSYCHIATRIC: No history of depression or anxiety.  ENDOCRINOLOGIC: No reports of sweating, cold or heat intolerance. No polyuria or polydipsia.  Marland Kitchen   Physical Examination Today's Vitals   12/17/21 1540  BP: 110/82  Pulse: 76  SpO2: 97%   There is no height or weight on file to calculate BMI.  Gen: resting comfortably, no acute distress HEENT: no scleral icterus, pupils equal round and reactive, no palptable cervical adenopathy,  CV: RRR, 2/6 systolic murmur rusb, no jvd Resp: Clear to auscultation bilaterally GI: abdomen is soft, non-tender, non-distended, normal bowel sounds, no hepatosplenomegaly MSK: extremities are warm, no edema.  Skin: warm, no rash Neuro:  no focal deficits Psych: appropriate affect   Diagnostic Studies  02/2018 nuclear stress Defect 1: There is a large defect of moderate severity present in the basal inferoseptal, basal inferior, mid anteroseptal, mid inferoseptal, mid inferior, apical septal and apical inferior location. Findings consistent with prior myocardial infarction. No significant ischemic zones. This is a high risk study based on degree of  scar and significantly depressed LVEF. Nuclear stress EF: 31%.  11/2017 echo Study Conclusions   - Left ventricle: The cavity size was mildly dilated. Wall   thickness was increased in a pattern of moderate LVH. Systolic   function was severely reduced. The estimated ejection fraction   was in the range of 20% to 25%. Diffuse hypokinesis. Doppler   parameters are consistent with abnormal left ventricular   relaxation (grade 1 diastolic dysfunction). Doppler parameters   are consistent with high ventricular filling pressure. - Aortic valve: There was mild regurgitation. - Mitral valve:  There was mild regurgitation. - Left atrium: The atrium was mildly dilated. - Pulmonary arteries: Systolic pressure was mildly increased. PA   peak pressure: 39 mm Hg (S).   Impressions:   - Severe global reduction in LV systolic function; moderate LVH;   mild LVE; mild diastolic dysfunction; mild AI and MR; mild LAE;   trace TR with mild pulmonary hypertension.   04/2018 echo Study Conclusions   - Left ventricle: The cavity size was normal. Wall thickness was   increased in a pattern of mild LVH. Systolic function was   severely reduced. The estimated ejection fraction was in the   range of 20% to 25%. Diffuse hypokinesis. Doppler parameters are   consistent with abnormal left ventricular relaxation (grade 1   diastolic dysfunction). Doppler parameters are consistent with   indeterminate ventricular filling pressure. - Regional wall motion abnormality: Akinesis of the basal-mid   anteroseptal myocardium. - Aortic valve: Trileaflet; mildly thickened, mildly calcified   leaflets. There was mild stenosis (low output, low gradient).   There was mild regurgitation. Valve area (VTI): 1.72 cm^2. Valve   area (Vmax): 1.69 cm^2. Valve area (Vmean): 1.66 cm^2. - Mitral valve: Mildly calcified annulus. Mildly thickened leaflets   . There was moderate regurgitation. - Left atrium: The atrium was  moderately dilated. - Tricuspid valve: There was mild-moderate regurgitation. - Pulmonary arteries: PA peak pressure: 36 mm Hg (S).     02/2019 echo IMPRESSIONS     1. Left ventricular ejection fraction, by visual estimation, is 50 to  55%. The left ventricle has normal function. Normal left ventricular size.  There is severely increased left ventricular hypertrophy.   2. Left ventricular diastolic Doppler parameters are consistent with  impaired relaxation pattern of LV diastolic filling.   3. Global right ventricle has normal systolic function.The right  ventricular size is normal. Moderately increased right ventricular wall  thickness.   4. Left atrial size was normal.   5. Right atrial size was normal.   6. Mild mitral annular calcification.   7. The mitral valve is degenerative. Mild mitral valve regurgitation.   8. The tricuspid valve is grossly normal. Tricuspid valve regurgitation  is trivial.   9. The aortic valve is tricuspid Aortic valve regurgitation is mild by  color flow Doppler. Mild aortic valve stenosis.  10. The pulmonic valve was grossly normal. Pulmonic valve regurgitation is  not visualized by color flow Doppler.  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  12. Aortic dilatation noted.  13. Normal pulmonary artery systolic pressure.  14. The inferior vena cava is normal in size with greater than 50%  respiratory variability, suggesting right atrial pressure of 3 mmHg.    11/2021 echo Summary    1. The left ventricle is normal in size with mildly to moderately increased  wall thickness.    2. The left ventricular systolic function is normal, LVEF is visually  estimated at 65-70%.    3. There is mild to moderate aortic valve stenosis.    4. The left atrium is mildly dilated in size.    5. The right ventricle is normal in size, with normal systolic function.    6. No evidence of valvular or endocardial vegetation.   Assessment and Plan  1. HFimpEF - medical  therapy limited by renal dysfunction, not on ACEI/ARB/aldactone/arni - most recent echo shows LVEF has normalized - no recent symptoms, continue current meds   2. CAD -no recent symptoms. On eliquis for DVT, can d/c asa  for now.    3. HTN -at goal, continue current meds   4. Hyperlipidemia - continue current meds, request labs from pcp     Arnoldo Lenis, M.D.

## 2021-12-17 NOTE — Patient Instructions (Addendum)
Medication Instructions:  Stop Aspirin Continue all other medications.     Labwork: none  Testing/Procedures: none  Follow-Up: 6 months   Any Other Special Instructions Will Be Listed Below (If Applicable).   If you need a refill on your cardiac medications before your next appointment, please call your pharmacy.  

## 2021-12-19 NOTE — Progress Notes (Signed)
Unable to reach patient for monthly ICM remote follow up.  Patient disenrolled due patient is not actively participating in [facility] clinic.  Device clinic 91 day remote monitoring will continue per protocol.

## 2021-12-19 NOTE — Progress Notes (Signed)
Opened in error

## 2021-12-24 ENCOUNTER — Ambulatory Visit (INDEPENDENT_AMBULATORY_CARE_PROVIDER_SITE_OTHER): Payer: No Typology Code available for payment source

## 2021-12-24 DIAGNOSIS — I130 Hypertensive heart and chronic kidney disease with heart failure and stage 1 through stage 4 chronic kidney disease, or unspecified chronic kidney disease: Secondary | ICD-10-CM | POA: Diagnosis not present

## 2021-12-24 DIAGNOSIS — Z7982 Long term (current) use of aspirin: Secondary | ICD-10-CM | POA: Diagnosis not present

## 2021-12-24 DIAGNOSIS — Z905 Acquired absence of kidney: Secondary | ICD-10-CM | POA: Diagnosis not present

## 2021-12-24 DIAGNOSIS — E1122 Type 2 diabetes mellitus with diabetic chronic kidney disease: Secondary | ICD-10-CM | POA: Diagnosis not present

## 2021-12-24 DIAGNOSIS — I429 Cardiomyopathy, unspecified: Secondary | ICD-10-CM | POA: Diagnosis not present

## 2021-12-24 DIAGNOSIS — I5032 Chronic diastolic (congestive) heart failure: Secondary | ICD-10-CM | POA: Diagnosis not present

## 2021-12-24 DIAGNOSIS — G9341 Metabolic encephalopathy: Secondary | ICD-10-CM | POA: Diagnosis not present

## 2021-12-24 DIAGNOSIS — E1142 Type 2 diabetes mellitus with diabetic polyneuropathy: Secondary | ICD-10-CM | POA: Diagnosis not present

## 2021-12-24 DIAGNOSIS — G20 Parkinson's disease: Secondary | ICD-10-CM | POA: Diagnosis not present

## 2021-12-24 DIAGNOSIS — I69352 Hemiplegia and hemiparesis following cerebral infarction affecting left dominant side: Secondary | ICD-10-CM | POA: Diagnosis not present

## 2021-12-24 DIAGNOSIS — Z85528 Personal history of other malignant neoplasm of kidney: Secondary | ICD-10-CM | POA: Diagnosis not present

## 2021-12-24 DIAGNOSIS — R41841 Cognitive communication deficit: Secondary | ICD-10-CM | POA: Diagnosis not present

## 2021-12-24 DIAGNOSIS — Z7901 Long term (current) use of anticoagulants: Secondary | ICD-10-CM | POA: Diagnosis not present

## 2021-12-24 DIAGNOSIS — Z950 Presence of cardiac pacemaker: Secondary | ICD-10-CM | POA: Diagnosis not present

## 2021-12-24 DIAGNOSIS — I82402 Acute embolism and thrombosis of unspecified deep veins of left lower extremity: Secondary | ICD-10-CM | POA: Diagnosis not present

## 2021-12-24 DIAGNOSIS — E876 Hypokalemia: Secondary | ICD-10-CM | POA: Diagnosis not present

## 2021-12-24 DIAGNOSIS — N189 Chronic kidney disease, unspecified: Secondary | ICD-10-CM | POA: Diagnosis not present

## 2021-12-24 LAB — CUP PACEART REMOTE DEVICE CHECK
Battery Remaining Longevity: 44 mo
Battery Remaining Percentage: 52 %
Battery Voltage: 2.95 V
Brady Statistic AP VP Percent: 14 %
Brady Statistic AP VS Percent: 1 %
Brady Statistic AS VP Percent: 85 %
Brady Statistic AS VS Percent: 1 %
Brady Statistic RA Percent Paced: 14 %
Date Time Interrogation Session: 20230806180020
HighPow Impedance: 62 Ohm
HighPow Impedance: 62 Ohm
Implantable Lead Implant Date: 20200129
Implantable Lead Implant Date: 20200129
Implantable Lead Implant Date: 20200129
Implantable Lead Location: 753858
Implantable Lead Location: 753859
Implantable Lead Location: 753860
Implantable Pulse Generator Implant Date: 20200129
Lead Channel Impedance Value: 390 Ohm
Lead Channel Impedance Value: 390 Ohm
Lead Channel Impedance Value: 980 Ohm
Lead Channel Pacing Threshold Amplitude: 0.5 V
Lead Channel Pacing Threshold Amplitude: 0.75 V
Lead Channel Pacing Threshold Amplitude: 0.875 V
Lead Channel Pacing Threshold Pulse Width: 0.5 ms
Lead Channel Pacing Threshold Pulse Width: 0.5 ms
Lead Channel Pacing Threshold Pulse Width: 1 ms
Lead Channel Sensing Intrinsic Amplitude: 12 mV
Lead Channel Sensing Intrinsic Amplitude: 3.8 mV
Lead Channel Setting Pacing Amplitude: 2 V
Lead Channel Setting Pacing Amplitude: 2 V
Lead Channel Setting Pacing Amplitude: 2 V
Lead Channel Setting Pacing Pulse Width: 0.5 ms
Lead Channel Setting Pacing Pulse Width: 1 ms
Lead Channel Setting Sensing Sensitivity: 0.5 mV
Pulse Gen Serial Number: 9877212

## 2021-12-27 DIAGNOSIS — I69352 Hemiplegia and hemiparesis following cerebral infarction affecting left dominant side: Secondary | ICD-10-CM | POA: Diagnosis not present

## 2021-12-27 DIAGNOSIS — G9341 Metabolic encephalopathy: Secondary | ICD-10-CM | POA: Diagnosis not present

## 2021-12-27 DIAGNOSIS — I82402 Acute embolism and thrombosis of unspecified deep veins of left lower extremity: Secondary | ICD-10-CM | POA: Diagnosis not present

## 2021-12-27 DIAGNOSIS — I130 Hypertensive heart and chronic kidney disease with heart failure and stage 1 through stage 4 chronic kidney disease, or unspecified chronic kidney disease: Secondary | ICD-10-CM | POA: Diagnosis not present

## 2021-12-27 DIAGNOSIS — G20 Parkinson's disease: Secondary | ICD-10-CM | POA: Diagnosis not present

## 2021-12-27 DIAGNOSIS — I5032 Chronic diastolic (congestive) heart failure: Secondary | ICD-10-CM | POA: Diagnosis not present

## 2021-12-31 ENCOUNTER — Other Ambulatory Visit: Payer: Self-pay | Admitting: Nurse Practitioner

## 2021-12-31 ENCOUNTER — Other Ambulatory Visit (HOSPITAL_COMMUNITY): Payer: Self-pay | Admitting: Nurse Practitioner

## 2021-12-31 DIAGNOSIS — G9341 Metabolic encephalopathy: Secondary | ICD-10-CM | POA: Diagnosis not present

## 2021-12-31 DIAGNOSIS — I82402 Acute embolism and thrombosis of unspecified deep veins of left lower extremity: Secondary | ICD-10-CM | POA: Diagnosis not present

## 2021-12-31 DIAGNOSIS — I69352 Hemiplegia and hemiparesis following cerebral infarction affecting left dominant side: Secondary | ICD-10-CM | POA: Diagnosis not present

## 2021-12-31 DIAGNOSIS — G20 Parkinson's disease: Secondary | ICD-10-CM | POA: Diagnosis not present

## 2021-12-31 DIAGNOSIS — I130 Hypertensive heart and chronic kidney disease with heart failure and stage 1 through stage 4 chronic kidney disease, or unspecified chronic kidney disease: Secondary | ICD-10-CM | POA: Diagnosis not present

## 2021-12-31 DIAGNOSIS — R209 Unspecified disturbances of skin sensation: Secondary | ICD-10-CM

## 2021-12-31 DIAGNOSIS — I5032 Chronic diastolic (congestive) heart failure: Secondary | ICD-10-CM | POA: Diagnosis not present

## 2021-12-31 DIAGNOSIS — R0989 Other specified symptoms and signs involving the circulatory and respiratory systems: Secondary | ICD-10-CM

## 2022-01-02 DIAGNOSIS — I82402 Acute embolism and thrombosis of unspecified deep veins of left lower extremity: Secondary | ICD-10-CM | POA: Diagnosis not present

## 2022-01-02 DIAGNOSIS — I69352 Hemiplegia and hemiparesis following cerebral infarction affecting left dominant side: Secondary | ICD-10-CM | POA: Diagnosis not present

## 2022-01-02 DIAGNOSIS — I5032 Chronic diastolic (congestive) heart failure: Secondary | ICD-10-CM | POA: Diagnosis not present

## 2022-01-02 DIAGNOSIS — G20 Parkinson's disease: Secondary | ICD-10-CM | POA: Diagnosis not present

## 2022-01-02 DIAGNOSIS — I130 Hypertensive heart and chronic kidney disease with heart failure and stage 1 through stage 4 chronic kidney disease, or unspecified chronic kidney disease: Secondary | ICD-10-CM | POA: Diagnosis not present

## 2022-01-02 DIAGNOSIS — G9341 Metabolic encephalopathy: Secondary | ICD-10-CM | POA: Diagnosis not present

## 2022-01-06 ENCOUNTER — Ambulatory Visit (HOSPITAL_COMMUNITY)
Admission: RE | Admit: 2022-01-06 | Discharge: 2022-01-06 | Disposition: A | Discharge status: Home / Self Care | Payer: No Typology Code available for payment source | Source: Ambulatory Visit | Attending: Nurse Practitioner | Admitting: Nurse Practitioner

## 2022-01-06 DIAGNOSIS — R209 Unspecified disturbances of skin sensation: Secondary | ICD-10-CM | POA: Diagnosis present

## 2022-01-06 DIAGNOSIS — R0989 Other specified symptoms and signs involving the circulatory and respiratory systems: Secondary | ICD-10-CM | POA: Insufficient documentation

## 2022-01-07 DIAGNOSIS — I69352 Hemiplegia and hemiparesis following cerebral infarction affecting left dominant side: Secondary | ICD-10-CM | POA: Diagnosis not present

## 2022-01-07 DIAGNOSIS — G20 Parkinson's disease: Secondary | ICD-10-CM | POA: Diagnosis not present

## 2022-01-07 DIAGNOSIS — I130 Hypertensive heart and chronic kidney disease with heart failure and stage 1 through stage 4 chronic kidney disease, or unspecified chronic kidney disease: Secondary | ICD-10-CM | POA: Diagnosis not present

## 2022-01-07 DIAGNOSIS — G9341 Metabolic encephalopathy: Secondary | ICD-10-CM | POA: Diagnosis not present

## 2022-01-07 DIAGNOSIS — I5032 Chronic diastolic (congestive) heart failure: Secondary | ICD-10-CM | POA: Diagnosis not present

## 2022-01-07 DIAGNOSIS — I82402 Acute embolism and thrombosis of unspecified deep veins of left lower extremity: Secondary | ICD-10-CM | POA: Diagnosis not present

## 2022-01-08 DIAGNOSIS — I5032 Chronic diastolic (congestive) heart failure: Secondary | ICD-10-CM | POA: Diagnosis not present

## 2022-01-08 DIAGNOSIS — I82402 Acute embolism and thrombosis of unspecified deep veins of left lower extremity: Secondary | ICD-10-CM | POA: Diagnosis not present

## 2022-01-08 DIAGNOSIS — G20 Parkinson's disease: Secondary | ICD-10-CM | POA: Diagnosis not present

## 2022-01-08 DIAGNOSIS — I69352 Hemiplegia and hemiparesis following cerebral infarction affecting left dominant side: Secondary | ICD-10-CM | POA: Diagnosis not present

## 2022-01-08 DIAGNOSIS — I130 Hypertensive heart and chronic kidney disease with heart failure and stage 1 through stage 4 chronic kidney disease, or unspecified chronic kidney disease: Secondary | ICD-10-CM | POA: Diagnosis not present

## 2022-01-08 DIAGNOSIS — G9341 Metabolic encephalopathy: Secondary | ICD-10-CM | POA: Diagnosis not present

## 2022-01-10 DIAGNOSIS — I69352 Hemiplegia and hemiparesis following cerebral infarction affecting left dominant side: Secondary | ICD-10-CM | POA: Diagnosis not present

## 2022-01-10 DIAGNOSIS — I130 Hypertensive heart and chronic kidney disease with heart failure and stage 1 through stage 4 chronic kidney disease, or unspecified chronic kidney disease: Secondary | ICD-10-CM | POA: Diagnosis not present

## 2022-01-10 DIAGNOSIS — G9341 Metabolic encephalopathy: Secondary | ICD-10-CM | POA: Diagnosis not present

## 2022-01-10 DIAGNOSIS — I82402 Acute embolism and thrombosis of unspecified deep veins of left lower extremity: Secondary | ICD-10-CM | POA: Diagnosis not present

## 2022-01-10 DIAGNOSIS — G20 Parkinson's disease: Secondary | ICD-10-CM | POA: Diagnosis not present

## 2022-01-10 DIAGNOSIS — I5032 Chronic diastolic (congestive) heart failure: Secondary | ICD-10-CM | POA: Diagnosis not present

## 2022-01-13 DIAGNOSIS — I5032 Chronic diastolic (congestive) heart failure: Secondary | ICD-10-CM | POA: Diagnosis not present

## 2022-01-13 DIAGNOSIS — G20 Parkinson's disease: Secondary | ICD-10-CM | POA: Diagnosis not present

## 2022-01-13 DIAGNOSIS — I69352 Hemiplegia and hemiparesis following cerebral infarction affecting left dominant side: Secondary | ICD-10-CM | POA: Diagnosis not present

## 2022-01-13 DIAGNOSIS — I82402 Acute embolism and thrombosis of unspecified deep veins of left lower extremity: Secondary | ICD-10-CM | POA: Diagnosis not present

## 2022-01-13 DIAGNOSIS — I130 Hypertensive heart and chronic kidney disease with heart failure and stage 1 through stage 4 chronic kidney disease, or unspecified chronic kidney disease: Secondary | ICD-10-CM | POA: Diagnosis not present

## 2022-01-13 DIAGNOSIS — G9341 Metabolic encephalopathy: Secondary | ICD-10-CM | POA: Diagnosis not present

## 2022-01-15 DIAGNOSIS — I69352 Hemiplegia and hemiparesis following cerebral infarction affecting left dominant side: Secondary | ICD-10-CM | POA: Diagnosis not present

## 2022-01-15 DIAGNOSIS — G20 Parkinson's disease: Secondary | ICD-10-CM | POA: Diagnosis not present

## 2022-01-15 DIAGNOSIS — G9341 Metabolic encephalopathy: Secondary | ICD-10-CM | POA: Diagnosis not present

## 2022-01-15 DIAGNOSIS — I5032 Chronic diastolic (congestive) heart failure: Secondary | ICD-10-CM | POA: Diagnosis not present

## 2022-01-15 DIAGNOSIS — I82402 Acute embolism and thrombosis of unspecified deep veins of left lower extremity: Secondary | ICD-10-CM | POA: Diagnosis not present

## 2022-01-15 DIAGNOSIS — I130 Hypertensive heart and chronic kidney disease with heart failure and stage 1 through stage 4 chronic kidney disease, or unspecified chronic kidney disease: Secondary | ICD-10-CM | POA: Diagnosis not present

## 2022-01-21 DIAGNOSIS — I69352 Hemiplegia and hemiparesis following cerebral infarction affecting left dominant side: Secondary | ICD-10-CM | POA: Diagnosis not present

## 2022-01-21 DIAGNOSIS — I82402 Acute embolism and thrombosis of unspecified deep veins of left lower extremity: Secondary | ICD-10-CM | POA: Diagnosis not present

## 2022-01-21 DIAGNOSIS — G9341 Metabolic encephalopathy: Secondary | ICD-10-CM | POA: Diagnosis not present

## 2022-01-21 DIAGNOSIS — I5032 Chronic diastolic (congestive) heart failure: Secondary | ICD-10-CM | POA: Diagnosis not present

## 2022-01-21 DIAGNOSIS — I130 Hypertensive heart and chronic kidney disease with heart failure and stage 1 through stage 4 chronic kidney disease, or unspecified chronic kidney disease: Secondary | ICD-10-CM | POA: Diagnosis not present

## 2022-01-21 DIAGNOSIS — G20 Parkinson's disease: Secondary | ICD-10-CM | POA: Diagnosis not present

## 2022-01-22 DIAGNOSIS — I5032 Chronic diastolic (congestive) heart failure: Secondary | ICD-10-CM | POA: Diagnosis not present

## 2022-01-22 DIAGNOSIS — G20 Parkinson's disease: Secondary | ICD-10-CM | POA: Diagnosis not present

## 2022-01-22 DIAGNOSIS — G9341 Metabolic encephalopathy: Secondary | ICD-10-CM | POA: Diagnosis not present

## 2022-01-22 DIAGNOSIS — I82402 Acute embolism and thrombosis of unspecified deep veins of left lower extremity: Secondary | ICD-10-CM | POA: Diagnosis not present

## 2022-01-22 DIAGNOSIS — I69352 Hemiplegia and hemiparesis following cerebral infarction affecting left dominant side: Secondary | ICD-10-CM | POA: Diagnosis not present

## 2022-01-22 DIAGNOSIS — I130 Hypertensive heart and chronic kidney disease with heart failure and stage 1 through stage 4 chronic kidney disease, or unspecified chronic kidney disease: Secondary | ICD-10-CM | POA: Diagnosis not present

## 2022-01-23 DIAGNOSIS — Z905 Acquired absence of kidney: Secondary | ICD-10-CM | POA: Diagnosis not present

## 2022-01-23 DIAGNOSIS — Z85528 Personal history of other malignant neoplasm of kidney: Secondary | ICD-10-CM | POA: Diagnosis not present

## 2022-01-23 DIAGNOSIS — G20 Parkinson's disease: Secondary | ICD-10-CM | POA: Diagnosis not present

## 2022-01-23 DIAGNOSIS — Z7901 Long term (current) use of anticoagulants: Secondary | ICD-10-CM | POA: Diagnosis not present

## 2022-01-23 DIAGNOSIS — I82402 Acute embolism and thrombosis of unspecified deep veins of left lower extremity: Secondary | ICD-10-CM | POA: Diagnosis not present

## 2022-01-23 DIAGNOSIS — Z950 Presence of cardiac pacemaker: Secondary | ICD-10-CM | POA: Diagnosis not present

## 2022-01-23 DIAGNOSIS — I5032 Chronic diastolic (congestive) heart failure: Secondary | ICD-10-CM | POA: Diagnosis not present

## 2022-01-23 DIAGNOSIS — E876 Hypokalemia: Secondary | ICD-10-CM | POA: Diagnosis not present

## 2022-01-23 DIAGNOSIS — I69352 Hemiplegia and hemiparesis following cerebral infarction affecting left dominant side: Secondary | ICD-10-CM | POA: Diagnosis not present

## 2022-01-23 DIAGNOSIS — I130 Hypertensive heart and chronic kidney disease with heart failure and stage 1 through stage 4 chronic kidney disease, or unspecified chronic kidney disease: Secondary | ICD-10-CM | POA: Diagnosis not present

## 2022-01-23 DIAGNOSIS — E1122 Type 2 diabetes mellitus with diabetic chronic kidney disease: Secondary | ICD-10-CM | POA: Diagnosis not present

## 2022-01-23 DIAGNOSIS — G9341 Metabolic encephalopathy: Secondary | ICD-10-CM | POA: Diagnosis not present

## 2022-01-23 DIAGNOSIS — Z7982 Long term (current) use of aspirin: Secondary | ICD-10-CM | POA: Diagnosis not present

## 2022-01-23 DIAGNOSIS — E1142 Type 2 diabetes mellitus with diabetic polyneuropathy: Secondary | ICD-10-CM | POA: Diagnosis not present

## 2022-01-23 DIAGNOSIS — N189 Chronic kidney disease, unspecified: Secondary | ICD-10-CM | POA: Diagnosis not present

## 2022-01-23 DIAGNOSIS — R41841 Cognitive communication deficit: Secondary | ICD-10-CM | POA: Diagnosis not present

## 2022-01-24 DIAGNOSIS — I82402 Acute embolism and thrombosis of unspecified deep veins of left lower extremity: Secondary | ICD-10-CM | POA: Diagnosis not present

## 2022-01-24 DIAGNOSIS — I5032 Chronic diastolic (congestive) heart failure: Secondary | ICD-10-CM | POA: Diagnosis not present

## 2022-01-24 DIAGNOSIS — G9341 Metabolic encephalopathy: Secondary | ICD-10-CM | POA: Diagnosis not present

## 2022-01-24 DIAGNOSIS — I69352 Hemiplegia and hemiparesis following cerebral infarction affecting left dominant side: Secondary | ICD-10-CM | POA: Diagnosis not present

## 2022-01-24 DIAGNOSIS — I130 Hypertensive heart and chronic kidney disease with heart failure and stage 1 through stage 4 chronic kidney disease, or unspecified chronic kidney disease: Secondary | ICD-10-CM | POA: Diagnosis not present

## 2022-01-24 DIAGNOSIS — G20 Parkinson's disease: Secondary | ICD-10-CM | POA: Diagnosis not present

## 2022-01-25 DIAGNOSIS — I69352 Hemiplegia and hemiparesis following cerebral infarction affecting left dominant side: Secondary | ICD-10-CM | POA: Diagnosis not present

## 2022-01-25 DIAGNOSIS — G9341 Metabolic encephalopathy: Secondary | ICD-10-CM | POA: Diagnosis not present

## 2022-01-25 DIAGNOSIS — I130 Hypertensive heart and chronic kidney disease with heart failure and stage 1 through stage 4 chronic kidney disease, or unspecified chronic kidney disease: Secondary | ICD-10-CM | POA: Diagnosis not present

## 2022-01-25 DIAGNOSIS — G20 Parkinson's disease: Secondary | ICD-10-CM | POA: Diagnosis not present

## 2022-01-25 DIAGNOSIS — I5032 Chronic diastolic (congestive) heart failure: Secondary | ICD-10-CM | POA: Diagnosis not present

## 2022-01-25 DIAGNOSIS — I82402 Acute embolism and thrombosis of unspecified deep veins of left lower extremity: Secondary | ICD-10-CM | POA: Diagnosis not present

## 2022-01-28 DIAGNOSIS — I69352 Hemiplegia and hemiparesis following cerebral infarction affecting left dominant side: Secondary | ICD-10-CM | POA: Diagnosis not present

## 2022-01-28 DIAGNOSIS — I5032 Chronic diastolic (congestive) heart failure: Secondary | ICD-10-CM | POA: Diagnosis not present

## 2022-01-28 DIAGNOSIS — I82402 Acute embolism and thrombosis of unspecified deep veins of left lower extremity: Secondary | ICD-10-CM | POA: Diagnosis not present

## 2022-01-28 DIAGNOSIS — G20 Parkinson's disease: Secondary | ICD-10-CM | POA: Diagnosis not present

## 2022-01-28 DIAGNOSIS — G9341 Metabolic encephalopathy: Secondary | ICD-10-CM | POA: Diagnosis not present

## 2022-01-28 DIAGNOSIS — I130 Hypertensive heart and chronic kidney disease with heart failure and stage 1 through stage 4 chronic kidney disease, or unspecified chronic kidney disease: Secondary | ICD-10-CM | POA: Diagnosis not present

## 2022-01-28 NOTE — Progress Notes (Signed)
Remote ICD transmission.   

## 2022-02-04 DIAGNOSIS — G20 Parkinson's disease: Secondary | ICD-10-CM | POA: Diagnosis not present

## 2022-02-04 DIAGNOSIS — G9341 Metabolic encephalopathy: Secondary | ICD-10-CM | POA: Diagnosis not present

## 2022-02-04 DIAGNOSIS — I69352 Hemiplegia and hemiparesis following cerebral infarction affecting left dominant side: Secondary | ICD-10-CM | POA: Diagnosis not present

## 2022-02-04 DIAGNOSIS — I130 Hypertensive heart and chronic kidney disease with heart failure and stage 1 through stage 4 chronic kidney disease, or unspecified chronic kidney disease: Secondary | ICD-10-CM | POA: Diagnosis not present

## 2022-02-04 DIAGNOSIS — I5032 Chronic diastolic (congestive) heart failure: Secondary | ICD-10-CM | POA: Diagnosis not present

## 2022-02-04 DIAGNOSIS — I82402 Acute embolism and thrombosis of unspecified deep veins of left lower extremity: Secondary | ICD-10-CM | POA: Diagnosis not present

## 2022-02-18 DIAGNOSIS — I130 Hypertensive heart and chronic kidney disease with heart failure and stage 1 through stage 4 chronic kidney disease, or unspecified chronic kidney disease: Secondary | ICD-10-CM | POA: Diagnosis not present

## 2022-02-18 DIAGNOSIS — G9341 Metabolic encephalopathy: Secondary | ICD-10-CM | POA: Diagnosis not present

## 2022-02-18 DIAGNOSIS — I69352 Hemiplegia and hemiparesis following cerebral infarction affecting left dominant side: Secondary | ICD-10-CM | POA: Diagnosis not present

## 2022-02-18 DIAGNOSIS — G20 Parkinson's disease: Secondary | ICD-10-CM | POA: Diagnosis not present

## 2022-02-18 DIAGNOSIS — I5032 Chronic diastolic (congestive) heart failure: Secondary | ICD-10-CM | POA: Diagnosis not present

## 2022-02-18 DIAGNOSIS — I82402 Acute embolism and thrombosis of unspecified deep veins of left lower extremity: Secondary | ICD-10-CM | POA: Diagnosis not present

## 2022-03-02 DIAGNOSIS — R4781 Slurred speech: Secondary | ICD-10-CM | POA: Diagnosis not present

## 2022-03-02 DIAGNOSIS — I6781 Acute cerebrovascular insufficiency: Secondary | ICD-10-CM | POA: Diagnosis not present

## 2022-03-02 DIAGNOSIS — M79602 Pain in left arm: Secondary | ICD-10-CM | POA: Diagnosis not present

## 2022-03-02 DIAGNOSIS — R0902 Hypoxemia: Secondary | ICD-10-CM | POA: Diagnosis not present

## 2022-03-02 DIAGNOSIS — Z743 Need for continuous supervision: Secondary | ICD-10-CM | POA: Diagnosis not present

## 2022-03-07 DIAGNOSIS — E119 Type 2 diabetes mellitus without complications: Secondary | ICD-10-CM | POA: Diagnosis not present

## 2022-03-07 DIAGNOSIS — E7849 Other hyperlipidemia: Secondary | ICD-10-CM | POA: Diagnosis not present

## 2022-03-07 DIAGNOSIS — F0390 Unspecified dementia without behavioral disturbance: Secondary | ICD-10-CM | POA: Diagnosis not present

## 2022-03-07 DIAGNOSIS — I429 Cardiomyopathy, unspecified: Secondary | ICD-10-CM | POA: Diagnosis not present

## 2022-03-07 DIAGNOSIS — M6281 Muscle weakness (generalized): Secondary | ICD-10-CM | POA: Diagnosis not present

## 2022-03-07 DIAGNOSIS — R41841 Cognitive communication deficit: Secondary | ICD-10-CM | POA: Diagnosis not present

## 2022-03-07 DIAGNOSIS — I10 Essential (primary) hypertension: Secondary | ICD-10-CM | POA: Diagnosis not present

## 2022-03-07 DIAGNOSIS — I69352 Hemiplegia and hemiparesis following cerebral infarction affecting left dominant side: Secondary | ICD-10-CM | POA: Diagnosis not present

## 2022-03-07 DIAGNOSIS — R2689 Other abnormalities of gait and mobility: Secondary | ICD-10-CM | POA: Diagnosis not present

## 2022-03-07 DIAGNOSIS — I639 Cerebral infarction, unspecified: Secondary | ICD-10-CM | POA: Diagnosis not present

## 2022-03-07 DIAGNOSIS — E876 Hypokalemia: Secondary | ICD-10-CM | POA: Diagnosis not present

## 2022-03-07 DIAGNOSIS — G20A1 Parkinson's disease without dyskinesia, without mention of fluctuations: Secondary | ICD-10-CM | POA: Diagnosis not present

## 2022-03-07 DIAGNOSIS — R531 Weakness: Secondary | ICD-10-CM | POA: Diagnosis not present

## 2022-03-09 DIAGNOSIS — I639 Cerebral infarction, unspecified: Secondary | ICD-10-CM | POA: Diagnosis not present

## 2022-03-09 DIAGNOSIS — I10 Essential (primary) hypertension: Secondary | ICD-10-CM | POA: Diagnosis not present

## 2022-03-09 DIAGNOSIS — E119 Type 2 diabetes mellitus without complications: Secondary | ICD-10-CM | POA: Diagnosis not present

## 2022-03-09 DIAGNOSIS — E7849 Other hyperlipidemia: Secondary | ICD-10-CM | POA: Diagnosis not present

## 2022-03-09 DIAGNOSIS — R531 Weakness: Secondary | ICD-10-CM | POA: Diagnosis not present

## 2022-04-04 ENCOUNTER — Ambulatory Visit (INDEPENDENT_AMBULATORY_CARE_PROVIDER_SITE_OTHER): Payer: Medicare Other

## 2022-04-04 DIAGNOSIS — I429 Cardiomyopathy, unspecified: Secondary | ICD-10-CM | POA: Diagnosis not present

## 2022-04-05 DIAGNOSIS — Z87891 Personal history of nicotine dependence: Secondary | ICD-10-CM | POA: Diagnosis not present

## 2022-04-05 DIAGNOSIS — F0284 Dementia in other diseases classified elsewhere, unspecified severity, with anxiety: Secondary | ICD-10-CM | POA: Diagnosis not present

## 2022-04-05 DIAGNOSIS — N189 Chronic kidney disease, unspecified: Secondary | ICD-10-CM | POA: Diagnosis not present

## 2022-04-05 DIAGNOSIS — R338 Other retention of urine: Secondary | ICD-10-CM | POA: Diagnosis not present

## 2022-04-05 DIAGNOSIS — N400 Enlarged prostate without lower urinary tract symptoms: Secondary | ICD-10-CM | POA: Diagnosis not present

## 2022-04-05 DIAGNOSIS — Z85528 Personal history of other malignant neoplasm of kidney: Secondary | ICD-10-CM | POA: Diagnosis not present

## 2022-04-05 DIAGNOSIS — I495 Sick sinus syndrome: Secondary | ICD-10-CM | POA: Diagnosis not present

## 2022-04-05 DIAGNOSIS — I69352 Hemiplegia and hemiparesis following cerebral infarction affecting left dominant side: Secondary | ICD-10-CM | POA: Diagnosis not present

## 2022-04-05 DIAGNOSIS — Z743 Need for continuous supervision: Secondary | ICD-10-CM | POA: Diagnosis not present

## 2022-04-05 DIAGNOSIS — G20A1 Parkinson's disease without dyskinesia, without mention of fluctuations: Secondary | ICD-10-CM | POA: Diagnosis not present

## 2022-04-05 DIAGNOSIS — E1122 Type 2 diabetes mellitus with diabetic chronic kidney disease: Secondary | ICD-10-CM | POA: Diagnosis not present

## 2022-04-05 DIAGNOSIS — I5032 Chronic diastolic (congestive) heart failure: Secondary | ICD-10-CM | POA: Diagnosis not present

## 2022-04-05 DIAGNOSIS — R532 Functional quadriplegia: Secondary | ICD-10-CM | POA: Diagnosis not present

## 2022-04-05 DIAGNOSIS — I130 Hypertensive heart and chronic kidney disease with heart failure and stage 1 through stage 4 chronic kidney disease, or unspecified chronic kidney disease: Secondary | ICD-10-CM | POA: Diagnosis not present

## 2022-04-05 DIAGNOSIS — R339 Retention of urine, unspecified: Secondary | ICD-10-CM | POA: Diagnosis not present

## 2022-04-05 DIAGNOSIS — Z7984 Long term (current) use of oral hypoglycemic drugs: Secondary | ICD-10-CM | POA: Diagnosis not present

## 2022-04-05 DIAGNOSIS — E785 Hyperlipidemia, unspecified: Secondary | ICD-10-CM | POA: Diagnosis not present

## 2022-04-05 DIAGNOSIS — Z7901 Long term (current) use of anticoagulants: Secondary | ICD-10-CM | POA: Diagnosis not present

## 2022-04-05 DIAGNOSIS — E1165 Type 2 diabetes mellitus with hyperglycemia: Secondary | ICD-10-CM | POA: Diagnosis not present

## 2022-04-05 DIAGNOSIS — Z87440 Personal history of urinary (tract) infections: Secondary | ICD-10-CM | POA: Diagnosis not present

## 2022-04-05 DIAGNOSIS — E876 Hypokalemia: Secondary | ICD-10-CM | POA: Diagnosis not present

## 2022-04-05 DIAGNOSIS — Z955 Presence of coronary angioplasty implant and graft: Secondary | ICD-10-CM | POA: Diagnosis not present

## 2022-04-05 DIAGNOSIS — Z950 Presence of cardiac pacemaker: Secondary | ICD-10-CM | POA: Diagnosis not present

## 2022-04-05 DIAGNOSIS — R1084 Generalized abdominal pain: Secondary | ICD-10-CM | POA: Diagnosis not present

## 2022-04-05 DIAGNOSIS — G9341 Metabolic encephalopathy: Secondary | ICD-10-CM | POA: Diagnosis not present

## 2022-04-05 DIAGNOSIS — A0472 Enterocolitis due to Clostridium difficile, not specified as recurrent: Secondary | ICD-10-CM | POA: Diagnosis not present

## 2022-04-05 DIAGNOSIS — Z86718 Personal history of other venous thrombosis and embolism: Secondary | ICD-10-CM | POA: Diagnosis not present

## 2022-04-05 DIAGNOSIS — Z905 Acquired absence of kidney: Secondary | ICD-10-CM | POA: Diagnosis not present

## 2022-04-07 LAB — CUP PACEART REMOTE DEVICE CHECK
Battery Remaining Longevity: 41 mo
Battery Remaining Percentage: 48 %
Battery Voltage: 2.95 V
Brady Statistic AP VP Percent: 21 %
Brady Statistic AP VS Percent: 1 %
Brady Statistic AS VP Percent: 79 %
Brady Statistic AS VS Percent: 1 %
Brady Statistic RA Percent Paced: 21 %
Date Time Interrogation Session: 20231117174008
HighPow Impedance: 68 Ohm
HighPow Impedance: 68 Ohm
Implantable Lead Connection Status: 753985
Implantable Lead Connection Status: 753985
Implantable Lead Connection Status: 753985
Implantable Lead Implant Date: 20200129
Implantable Lead Implant Date: 20200129
Implantable Lead Implant Date: 20200129
Implantable Lead Location: 753858
Implantable Lead Location: 753859
Implantable Lead Location: 753860
Implantable Pulse Generator Implant Date: 20200129
Lead Channel Impedance Value: 360 Ohm
Lead Channel Impedance Value: 380 Ohm
Lead Channel Impedance Value: 910 Ohm
Lead Channel Pacing Threshold Amplitude: 0.5 V
Lead Channel Pacing Threshold Amplitude: 0.75 V
Lead Channel Pacing Threshold Amplitude: 0.75 V
Lead Channel Pacing Threshold Pulse Width: 0.5 ms
Lead Channel Pacing Threshold Pulse Width: 0.5 ms
Lead Channel Pacing Threshold Pulse Width: 1 ms
Lead Channel Sensing Intrinsic Amplitude: 12 mV
Lead Channel Sensing Intrinsic Amplitude: 3.4 mV
Lead Channel Setting Pacing Amplitude: 2 V
Lead Channel Setting Pacing Amplitude: 2 V
Lead Channel Setting Pacing Amplitude: 2 V
Lead Channel Setting Pacing Pulse Width: 0.5 ms
Lead Channel Setting Pacing Pulse Width: 1 ms
Lead Channel Setting Sensing Sensitivity: 0.5 mV
Pulse Gen Serial Number: 9877212
Zone Setting Status: 755011

## 2022-04-08 DIAGNOSIS — I5032 Chronic diastolic (congestive) heart failure: Secondary | ICD-10-CM | POA: Diagnosis not present

## 2022-04-08 DIAGNOSIS — I69352 Hemiplegia and hemiparesis following cerebral infarction affecting left dominant side: Secondary | ICD-10-CM | POA: Diagnosis not present

## 2022-04-08 DIAGNOSIS — I130 Hypertensive heart and chronic kidney disease with heart failure and stage 1 through stage 4 chronic kidney disease, or unspecified chronic kidney disease: Secondary | ICD-10-CM | POA: Diagnosis not present

## 2022-04-08 DIAGNOSIS — E1165 Type 2 diabetes mellitus with hyperglycemia: Secondary | ICD-10-CM | POA: Diagnosis not present

## 2022-04-08 DIAGNOSIS — E1122 Type 2 diabetes mellitus with diabetic chronic kidney disease: Secondary | ICD-10-CM | POA: Diagnosis not present

## 2022-04-08 DIAGNOSIS — N189 Chronic kidney disease, unspecified: Secondary | ICD-10-CM | POA: Diagnosis not present

## 2022-04-09 DIAGNOSIS — I69352 Hemiplegia and hemiparesis following cerebral infarction affecting left dominant side: Secondary | ICD-10-CM | POA: Diagnosis not present

## 2022-04-09 DIAGNOSIS — I5032 Chronic diastolic (congestive) heart failure: Secondary | ICD-10-CM | POA: Diagnosis not present

## 2022-04-09 DIAGNOSIS — E1122 Type 2 diabetes mellitus with diabetic chronic kidney disease: Secondary | ICD-10-CM | POA: Diagnosis not present

## 2022-04-09 DIAGNOSIS — E1165 Type 2 diabetes mellitus with hyperglycemia: Secondary | ICD-10-CM | POA: Diagnosis not present

## 2022-04-09 DIAGNOSIS — I130 Hypertensive heart and chronic kidney disease with heart failure and stage 1 through stage 4 chronic kidney disease, or unspecified chronic kidney disease: Secondary | ICD-10-CM | POA: Diagnosis not present

## 2022-04-09 DIAGNOSIS — N189 Chronic kidney disease, unspecified: Secondary | ICD-10-CM | POA: Diagnosis not present

## 2022-04-14 DIAGNOSIS — I130 Hypertensive heart and chronic kidney disease with heart failure and stage 1 through stage 4 chronic kidney disease, or unspecified chronic kidney disease: Secondary | ICD-10-CM | POA: Diagnosis not present

## 2022-04-14 DIAGNOSIS — I5032 Chronic diastolic (congestive) heart failure: Secondary | ICD-10-CM | POA: Diagnosis not present

## 2022-04-14 DIAGNOSIS — I69352 Hemiplegia and hemiparesis following cerebral infarction affecting left dominant side: Secondary | ICD-10-CM | POA: Diagnosis not present

## 2022-04-14 DIAGNOSIS — E1122 Type 2 diabetes mellitus with diabetic chronic kidney disease: Secondary | ICD-10-CM | POA: Diagnosis not present

## 2022-04-14 DIAGNOSIS — N189 Chronic kidney disease, unspecified: Secondary | ICD-10-CM | POA: Diagnosis not present

## 2022-04-14 DIAGNOSIS — E1165 Type 2 diabetes mellitus with hyperglycemia: Secondary | ICD-10-CM | POA: Diagnosis not present

## 2022-04-15 DIAGNOSIS — E1122 Type 2 diabetes mellitus with diabetic chronic kidney disease: Secondary | ICD-10-CM | POA: Diagnosis not present

## 2022-04-15 DIAGNOSIS — I69352 Hemiplegia and hemiparesis following cerebral infarction affecting left dominant side: Secondary | ICD-10-CM | POA: Diagnosis not present

## 2022-04-15 DIAGNOSIS — I130 Hypertensive heart and chronic kidney disease with heart failure and stage 1 through stage 4 chronic kidney disease, or unspecified chronic kidney disease: Secondary | ICD-10-CM | POA: Diagnosis not present

## 2022-04-15 DIAGNOSIS — N189 Chronic kidney disease, unspecified: Secondary | ICD-10-CM | POA: Diagnosis not present

## 2022-04-15 DIAGNOSIS — E1165 Type 2 diabetes mellitus with hyperglycemia: Secondary | ICD-10-CM | POA: Diagnosis not present

## 2022-04-15 DIAGNOSIS — I5032 Chronic diastolic (congestive) heart failure: Secondary | ICD-10-CM | POA: Diagnosis not present

## 2022-04-16 DIAGNOSIS — I5032 Chronic diastolic (congestive) heart failure: Secondary | ICD-10-CM | POA: Diagnosis not present

## 2022-04-16 DIAGNOSIS — E1122 Type 2 diabetes mellitus with diabetic chronic kidney disease: Secondary | ICD-10-CM | POA: Diagnosis not present

## 2022-04-16 DIAGNOSIS — N189 Chronic kidney disease, unspecified: Secondary | ICD-10-CM | POA: Diagnosis not present

## 2022-04-16 DIAGNOSIS — E1165 Type 2 diabetes mellitus with hyperglycemia: Secondary | ICD-10-CM | POA: Diagnosis not present

## 2022-04-16 DIAGNOSIS — I130 Hypertensive heart and chronic kidney disease with heart failure and stage 1 through stage 4 chronic kidney disease, or unspecified chronic kidney disease: Secondary | ICD-10-CM | POA: Diagnosis not present

## 2022-04-16 DIAGNOSIS — I69352 Hemiplegia and hemiparesis following cerebral infarction affecting left dominant side: Secondary | ICD-10-CM | POA: Diagnosis not present

## 2022-04-17 DIAGNOSIS — I69352 Hemiplegia and hemiparesis following cerebral infarction affecting left dominant side: Secondary | ICD-10-CM | POA: Diagnosis not present

## 2022-04-17 DIAGNOSIS — I130 Hypertensive heart and chronic kidney disease with heart failure and stage 1 through stage 4 chronic kidney disease, or unspecified chronic kidney disease: Secondary | ICD-10-CM | POA: Diagnosis not present

## 2022-04-17 DIAGNOSIS — E1122 Type 2 diabetes mellitus with diabetic chronic kidney disease: Secondary | ICD-10-CM | POA: Diagnosis not present

## 2022-04-17 DIAGNOSIS — I5032 Chronic diastolic (congestive) heart failure: Secondary | ICD-10-CM | POA: Diagnosis not present

## 2022-04-17 DIAGNOSIS — N189 Chronic kidney disease, unspecified: Secondary | ICD-10-CM | POA: Diagnosis not present

## 2022-04-17 DIAGNOSIS — E1165 Type 2 diabetes mellitus with hyperglycemia: Secondary | ICD-10-CM | POA: Diagnosis not present

## 2022-04-21 DIAGNOSIS — N189 Chronic kidney disease, unspecified: Secondary | ICD-10-CM | POA: Diagnosis not present

## 2022-04-21 DIAGNOSIS — I130 Hypertensive heart and chronic kidney disease with heart failure and stage 1 through stage 4 chronic kidney disease, or unspecified chronic kidney disease: Secondary | ICD-10-CM | POA: Diagnosis not present

## 2022-04-21 DIAGNOSIS — E1165 Type 2 diabetes mellitus with hyperglycemia: Secondary | ICD-10-CM | POA: Diagnosis not present

## 2022-04-21 DIAGNOSIS — I5032 Chronic diastolic (congestive) heart failure: Secondary | ICD-10-CM | POA: Diagnosis not present

## 2022-04-21 DIAGNOSIS — E1122 Type 2 diabetes mellitus with diabetic chronic kidney disease: Secondary | ICD-10-CM | POA: Diagnosis not present

## 2022-04-21 DIAGNOSIS — I69352 Hemiplegia and hemiparesis following cerebral infarction affecting left dominant side: Secondary | ICD-10-CM | POA: Diagnosis not present

## 2022-04-22 DIAGNOSIS — E1122 Type 2 diabetes mellitus with diabetic chronic kidney disease: Secondary | ICD-10-CM | POA: Diagnosis not present

## 2022-04-22 DIAGNOSIS — N189 Chronic kidney disease, unspecified: Secondary | ICD-10-CM | POA: Diagnosis not present

## 2022-04-22 DIAGNOSIS — I130 Hypertensive heart and chronic kidney disease with heart failure and stage 1 through stage 4 chronic kidney disease, or unspecified chronic kidney disease: Secondary | ICD-10-CM | POA: Diagnosis not present

## 2022-04-22 DIAGNOSIS — E1165 Type 2 diabetes mellitus with hyperglycemia: Secondary | ICD-10-CM | POA: Diagnosis not present

## 2022-04-22 DIAGNOSIS — I5032 Chronic diastolic (congestive) heart failure: Secondary | ICD-10-CM | POA: Diagnosis not present

## 2022-04-22 DIAGNOSIS — I69352 Hemiplegia and hemiparesis following cerebral infarction affecting left dominant side: Secondary | ICD-10-CM | POA: Diagnosis not present

## 2022-04-22 NOTE — Progress Notes (Signed)
Remote ICD transmission.   

## 2022-04-23 DIAGNOSIS — I69352 Hemiplegia and hemiparesis following cerebral infarction affecting left dominant side: Secondary | ICD-10-CM | POA: Diagnosis not present

## 2022-04-23 DIAGNOSIS — I130 Hypertensive heart and chronic kidney disease with heart failure and stage 1 through stage 4 chronic kidney disease, or unspecified chronic kidney disease: Secondary | ICD-10-CM | POA: Diagnosis not present

## 2022-04-23 DIAGNOSIS — N189 Chronic kidney disease, unspecified: Secondary | ICD-10-CM | POA: Diagnosis not present

## 2022-04-23 DIAGNOSIS — E1165 Type 2 diabetes mellitus with hyperglycemia: Secondary | ICD-10-CM | POA: Diagnosis not present

## 2022-04-23 DIAGNOSIS — I5032 Chronic diastolic (congestive) heart failure: Secondary | ICD-10-CM | POA: Diagnosis not present

## 2022-04-23 DIAGNOSIS — E1122 Type 2 diabetes mellitus with diabetic chronic kidney disease: Secondary | ICD-10-CM | POA: Diagnosis not present

## 2022-04-30 DIAGNOSIS — E1122 Type 2 diabetes mellitus with diabetic chronic kidney disease: Secondary | ICD-10-CM | POA: Diagnosis not present

## 2022-04-30 DIAGNOSIS — I130 Hypertensive heart and chronic kidney disease with heart failure and stage 1 through stage 4 chronic kidney disease, or unspecified chronic kidney disease: Secondary | ICD-10-CM | POA: Diagnosis not present

## 2022-04-30 DIAGNOSIS — E1165 Type 2 diabetes mellitus with hyperglycemia: Secondary | ICD-10-CM | POA: Diagnosis not present

## 2022-04-30 DIAGNOSIS — I69352 Hemiplegia and hemiparesis following cerebral infarction affecting left dominant side: Secondary | ICD-10-CM | POA: Diagnosis not present

## 2022-04-30 DIAGNOSIS — I5032 Chronic diastolic (congestive) heart failure: Secondary | ICD-10-CM | POA: Diagnosis not present

## 2022-04-30 DIAGNOSIS — N189 Chronic kidney disease, unspecified: Secondary | ICD-10-CM | POA: Diagnosis not present

## 2022-05-01 DIAGNOSIS — N189 Chronic kidney disease, unspecified: Secondary | ICD-10-CM | POA: Diagnosis not present

## 2022-05-01 DIAGNOSIS — I130 Hypertensive heart and chronic kidney disease with heart failure and stage 1 through stage 4 chronic kidney disease, or unspecified chronic kidney disease: Secondary | ICD-10-CM | POA: Diagnosis not present

## 2022-05-01 DIAGNOSIS — E1122 Type 2 diabetes mellitus with diabetic chronic kidney disease: Secondary | ICD-10-CM | POA: Diagnosis not present

## 2022-05-01 DIAGNOSIS — E1165 Type 2 diabetes mellitus with hyperglycemia: Secondary | ICD-10-CM | POA: Diagnosis not present

## 2022-05-01 DIAGNOSIS — I5032 Chronic diastolic (congestive) heart failure: Secondary | ICD-10-CM | POA: Diagnosis not present

## 2022-05-01 DIAGNOSIS — I69352 Hemiplegia and hemiparesis following cerebral infarction affecting left dominant side: Secondary | ICD-10-CM | POA: Diagnosis not present

## 2022-07-04 ENCOUNTER — Ambulatory Visit (INDEPENDENT_AMBULATORY_CARE_PROVIDER_SITE_OTHER): Payer: Medicare Other

## 2022-07-04 DIAGNOSIS — I429 Cardiomyopathy, unspecified: Secondary | ICD-10-CM | POA: Diagnosis not present

## 2022-07-04 LAB — CUP PACEART REMOTE DEVICE CHECK
Battery Remaining Longevity: 40 mo
Battery Remaining Percentage: 46 %
Battery Voltage: 2.93 V
Brady Statistic AP VP Percent: 22 %
Brady Statistic AP VS Percent: 1 %
Brady Statistic AS VP Percent: 78 %
Brady Statistic AS VS Percent: 1 %
Brady Statistic RA Percent Paced: 21 %
Date Time Interrogation Session: 20240216020015
HighPow Impedance: 60 Ohm
HighPow Impedance: 60 Ohm
Implantable Lead Connection Status: 753985
Implantable Lead Connection Status: 753985
Implantable Lead Connection Status: 753985
Implantable Lead Implant Date: 20200129
Implantable Lead Implant Date: 20200129
Implantable Lead Implant Date: 20200129
Implantable Lead Location: 753858
Implantable Lead Location: 753859
Implantable Lead Location: 753860
Implantable Pulse Generator Implant Date: 20200129
Lead Channel Impedance Value: 380 Ohm
Lead Channel Impedance Value: 390 Ohm
Lead Channel Impedance Value: 910 Ohm
Lead Channel Pacing Threshold Amplitude: 0.5 V
Lead Channel Pacing Threshold Amplitude: 0.75 V
Lead Channel Pacing Threshold Amplitude: 0.75 V
Lead Channel Pacing Threshold Pulse Width: 0.5 ms
Lead Channel Pacing Threshold Pulse Width: 0.5 ms
Lead Channel Pacing Threshold Pulse Width: 1 ms
Lead Channel Sensing Intrinsic Amplitude: 12 mV
Lead Channel Sensing Intrinsic Amplitude: 3.5 mV
Lead Channel Setting Pacing Amplitude: 2 V
Lead Channel Setting Pacing Amplitude: 2 V
Lead Channel Setting Pacing Amplitude: 2 V
Lead Channel Setting Pacing Pulse Width: 0.5 ms
Lead Channel Setting Pacing Pulse Width: 1 ms
Lead Channel Setting Sensing Sensitivity: 0.5 mV
Pulse Gen Serial Number: 9877212
Zone Setting Status: 755011

## 2022-07-07 ENCOUNTER — Ambulatory Visit: Payer: Medicare Other | Admitting: Cardiology

## 2022-07-07 NOTE — Progress Notes (Deleted)
Clinical Summary Jonathan Floyd is a 77 y.o.male  seen today for follow up of the following medical problems.      1. Chronic systolic HF - echo [id number] LVEF 0000000, grade I diastolic dysfunction   -Jan 29/2020 had BiV AICD placed.  02/2019 echo: LVEF 50-55%, grade I DDX,       - chronic SOB that is intermittent - no recent edema. Takes toresmide prn, takes about 2 times a month - 12/2020 normal device check   11/2021 echo [facility]: LVEF 65-70%,  - no recent, no SOB/DOE - compliant with meds       2. History of CAD - history of prior cardiac stents done through [place]. Last in 2017.  - he has a stent card: Nov 2017 xience to prox LAD - EKG with chronic LBBB   -02/2018 nuclear stress large scar as reported below, no significant ischemia.    - denies any recent chest pains     3. HTN - he is compliant with meds     4. Hyperlipidemia - labs followed by [facility]   02/2019 LDL 74 - compliant with meds   5. CKD III - history of nephrectomy.  Followed by  nephrology Dr Theador Hawthorne     6. History of CVA - admitted to [facility] 03/2021 - discharged to rehab   7. PEG tube - recent infection, had to be removed   8. Aortic stenosis - 11/2021 [facility] mild to mod AS mean 11, no area was reported     9. DVT -11/20/21 [place] left DVT - on eliquis   SH: fan [employer] Past Medical History:  Diagnosis Date   AICD (automatic cardioverter/defibrillator) present 06/16/2018   Cancer ([place])    43 years ago- kidney taken out for it   Chronic systolic CHF (congestive heart failure) ([place])    CKD (chronic kidney disease), stage III ([place])    1 kidney   Coronary artery disease 2017   DES pLAD, [place]. 2 other stents in 2014   Diabetes mellitus without complication ([place])    Hypertension    Renal insufficiency      Allergies  Allergen Reactions   Iodinated Contrast Media Other (See Comments)    Kidney problems   Iodine Other (See Comments)    Only has one  kidney     Current Outpatient Medications  Medication Sig Dispense Refill   amantadine (SYMMETREL) 50 MG/5ML solution Give 10 mLs by tube 2 (two) times daily.     amLODipine (NORVASC) 10 MG tablet Take 10 mg by mouth daily.     apixaban (ELIQUIS) 5 MG TABS tablet Take 5 mg by mouth daily.     ascorbic acid (VITAMIN C) 500 MG tablet Take 500 mg by mouth daily.     atorvastatin (LIPITOR) 80 MG tablet Take 80 mg by mouth at bedtime.     carbidopa-levodopa (SINEMET IR) 25-100 MG tablet Give 1 tablet by tube 3 (three) times daily.     carvedilol (COREG) 25 MG tablet Take 1 tablet (25 mg total) by mouth 2 (two) times daily. 180 tablet 3   Docusate Sodium (STOOL SOFTENER) 100 MG capsule Take 200 mg by mouth 2 (two) times daily.     finasteride (PROSCAR) 5 MG tablet Take 5 mg by mouth daily.     lisinopril (ZESTRIL) 5 MG tablet Take 5 mg by mouth daily.     nitroGLYCERIN (NITROSTAT) 0.4 MG SL tablet Place  0.4 mg under the tongue every 5 (five) minutes as needed for chest pain.     Polyethylene Glycol 3350 POWD 17 gm mixed in 4-6 ounces of fluid by mouth daily for constipation     pravastatin (PRAVACHOL) 40 MG tablet Take 40 mg by mouth daily.     Zinc 50 MG TABS Take 1 tablet by mouth daily.     No current facility-administered medications for this visit.     Past Surgical History:  Procedure Laterality Date   BIV ICD INSERTION CRT-D N/A 06/16/2018   Procedure: BIV ICD INSERTION CRT-D;  Surgeon: Constance Haw, MD;  Location: [place] [facility];  Service: Cardiovascular;  Laterality: N/A;   CORONARY ANGIOPLASTY WITH STENT PLACEMENT     ICD IMPLANT  03/17/2019   BIV   KNEE ARTHROPLASTY     NEPHRECTOMY  1976     Allergies  Allergen Reactions   Iodinated Contrast Media Other (See Comments)    Kidney problems   Iodine Other (See Comments)    Only has one kidney      Family History  Problem Relation Age of Onset   Dementia Mother    Diabetes Mother    Heart attack Father     Heart disease Father    Hypertension Father      Social History Jonathan Floyd reports that he quit smoking about 35 years ago. His smoking use included cigarettes. He has never used smokeless tobacco. Jonathan Floyd reports current alcohol use.   Review of Systems CONSTITUTIONAL: No weight loss, fever, chills, weakness or fatigue.  HEENT: Eyes: No visual loss, blurred vision, double vision or yellow sclerae.No hearing loss, sneezing, congestion, runny nose or sore throat.  SKIN: No rash or itching.  CARDIOVASCULAR:  RESPIRATORY: No shortness of breath, cough or sputum.  GASTROINTESTINAL: No anorexia, nausea, vomiting or diarrhea. No abdominal pain or blood.  GENITOURINARY: No burning on urination, no polyuria NEUROLOGICAL: No headache, dizziness, syncope, paralysis, ataxia, numbness or tingling in the extremities. No change in bowel or bladder control.  MUSCULOSKELETAL: No muscle, back pain, joint pain or stiffness.  LYMPHATICS: No enlarged nodes. No history of splenectomy.  PSYCHIATRIC: No history of depression or anxiety.  ENDOCRINOLOGIC: No reports of sweating, cold or heat intolerance. No polyuria or polydipsia.  Marland Kitchen   Physical Examination There were no vitals filed for this visit. There were no vitals filed for this visit.  Gen: resting comfortably, no acute distress HEENT: no scleral icterus, pupils equal round and reactive, no palptable cervical adenopathy,  CV Resp: Clear to auscultation bilaterally GI: abdomen is soft, non-tender, non-distended, normal bowel sounds, no hepatosplenomegaly MSK: extremities are warm, no edema.  Skin: warm, no rash Neuro:  no focal deficits Psych: appropriate affect   Diagnostic Studies  02/2018 nuclear stress Defect 1: There is a large defect of moderate severity present in the basal inferoseptal, basal inferior, mid anteroseptal, mid inferoseptal, mid inferior, apical septal and apical inferior location. Findings consistent with prior  myocardial infarction. No significant ischemic zones. This is a high risk study based on degree of scar and significantly depressed LVEF. Nuclear stress EF: 31%.   11/2017 echo Study Conclusions   - Left ventricle: The cavity size was mildly dilated. Wall   thickness was increased in a pattern of moderate LVH. Systolic   function was severely reduced. The estimated ejection fraction   was in the range of 20% to 25%. Diffuse hypokinesis. Doppler   parameters are consistent with abnormal left ventricular  relaxation (grade 1 diastolic dysfunction). Doppler parameters   are consistent with high ventricular filling pressure. - Aortic valve: There was mild regurgitation. - Mitral valve: There was mild regurgitation. - Left atrium: The atrium was mildly dilated. - Pulmonary arteries: Systolic pressure was mildly increased. PA   peak pressure: 39 mm Hg (S).   Impressions:   - Severe global reduction in LV systolic function; moderate LVH;   mild LVE; mild diastolic dysfunction; mild AI and MR; mild LAE;   trace TR with mild pulmonary hypertension.   04/2018 echo Study Conclusions   - Left ventricle: The cavity size was normal. Wall thickness was   increased in a pattern of mild LVH. Systolic function was   severely reduced. The estimated ejection fraction was in the   range of 20% to 25%. Diffuse hypokinesis. Doppler parameters are   consistent with abnormal left ventricular relaxation (grade 1   diastolic dysfunction). Doppler parameters are consistent with   indeterminate ventricular filling pressure. - Regional wall motion abnormality: Akinesis of the basal-mid   anteroseptal myocardium. - Aortic valve: Trileaflet; mildly thickened, mildly calcified   leaflets. There was mild stenosis (low output, low gradient).   There was mild regurgitation. Valve area (VTI): 1.72 cm^2. Valve   area (Vmax): 1.69 cm^2. Valve area (Vmean): 1.66 cm^2. - Mitral valve: Mildly calcified annulus.  Mildly thickened leaflets   . There was moderate regurgitation. - Left atrium: The atrium was moderately dilated. - Tricuspid valve: There was mild-moderate regurgitation. - Pulmonary arteries: PA peak pressure: 36 mm Hg (S).     02/2019 echo IMPRESSIONS     1. Left ventricular ejection fraction, by visual estimation, is 50 to  55%. The left ventricle has normal function. Normal left ventricular size.  There is severely increased left ventricular hypertrophy.   2. Left ventricular diastolic Doppler parameters are consistent with  impaired relaxation pattern of LV diastolic filling.   3. Global right ventricle has normal systolic function.The right  ventricular size is normal. Moderately increased right ventricular wall  thickness.   4. Left atrial size was normal.   5. Right atrial size was normal.   6. Mild mitral annular calcification.   7. The mitral valve is degenerative. Mild mitral valve regurgitation.   8. The tricuspid valve is grossly normal. Tricuspid valve regurgitation  is trivial.   9. The aortic valve is tricuspid Aortic valve regurgitation is mild by  color flow Doppler. Mild aortic valve stenosis.  10. The pulmonic valve was grossly normal. Pulmonic valve regurgitation is  not visualized by color flow Doppler.  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  12. Aortic dilatation noted.  13. Normal pulmonary artery systolic pressure.  14. The inferior vena cava is normal in size with greater than 50%  respiratory variability, suggesting right atrial pressure of 3 mmHg.      11/2021 echo Summary    1. The left ventricle is normal in size with mildly to moderately increased  wall thickness.    2. The left ventricular systolic function is normal, LVEF is visually  estimated at 65-70%.    3. There is mild to moderate aortic valve stenosis.    4. The left atrium is mildly dilated in size.    5. The right ventricle is normal in size, with normal systolic function.    6. No  evidence of valvular or endocardial vegetation.    Assessment and Plan  1. Chronic systolic HF/CAD - medical therapy limited by renal dysfunction, not  on ACEI/ARB/aldactone/arni - most recent echo shows LVEF has normalized -no symptoms, continue current meds   2. CAD -no symptoms, continue current meds   3. HTN -bp at goal, continue current meds   4. Hyperlipidemia - continue atorvastatin, request pcp labs.      Arnoldo Lenis, M.D., F.A.C.C.

## 2022-07-29 NOTE — Progress Notes (Signed)
Remote ICD transmission.   

## 2022-09-19 ENCOUNTER — Encounter: Payer: Medicare Other | Admitting: Internal Medicine

## 2022-09-19 ENCOUNTER — Ambulatory Visit: Payer: Medicare Other | Admitting: Cardiovascular Disease

## 2022-10-18 DEATH — deceased

## 2022-10-31 ENCOUNTER — Ambulatory Visit: Payer: No Typology Code available for payment source | Admitting: Cardiovascular Disease

## 2023-03-15 IMAGING — MR MR HEAD W/O CM
7 series · 41 of 48 positions shown · non-contrast
Comparison: [HOSPITAL] [facility] 03/22/2021 and earlier

CLINICAL DATA: 75-year-old male with neurologic deficit.
Conditional cardiac pacemaker.

EXAM:
MRI HEAD WITHOUT CONTRAST
TECHNIQUE: Multiplanar, multiecho pulse sequences of the brain and surrounding
structures were obtained without intravenous contrast.

[Series 3: DWI · axial · 3.0mm · 1.09mm/px · z∈[-62,+86]mm · 9 of 102 slices shown (1 of 4)]
[im 1/102]
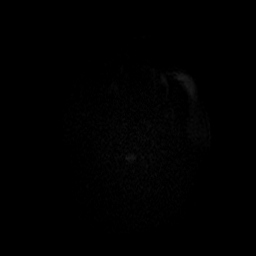
[im 15/102]
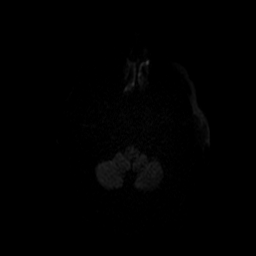
[im 29/102]
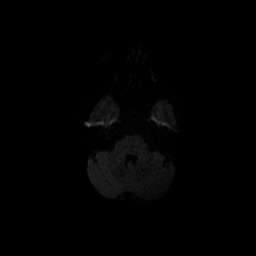
[im 44/102]
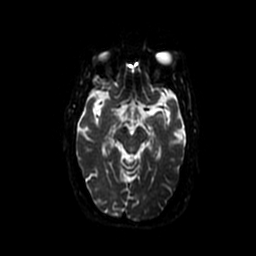
[im 51/102]
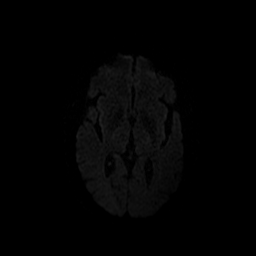
[im 58/102]
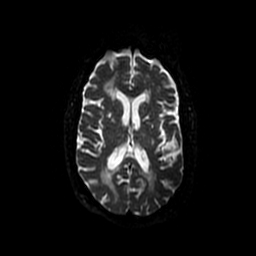
[im 73/102]
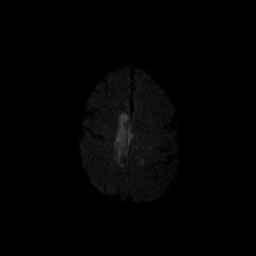
[im 87/102]
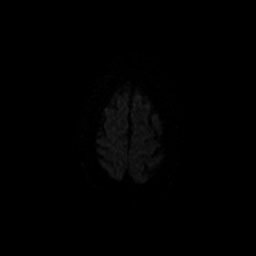
[im 102/102]
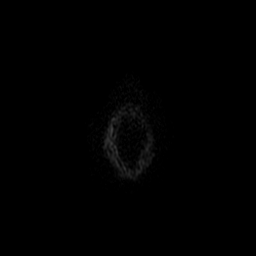

[Series 4: DWI · coronal · 5.0mm · 1.09mm/px · 11 of 78 slices shown (2 of 4)]
[im 1/78]
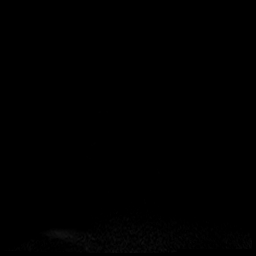
[im 8/78]
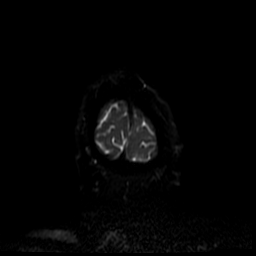
[im 16/78]
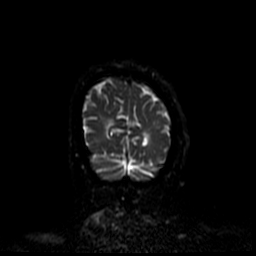
[im 24/78]
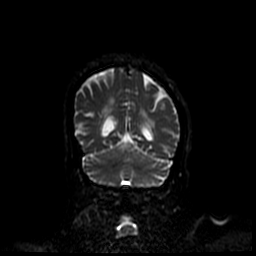
[im 31/78]
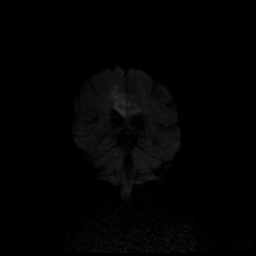
[im 39/78]
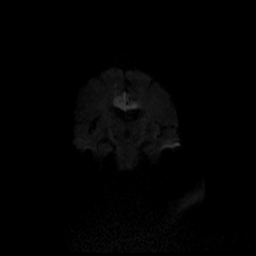
[im 47/78]
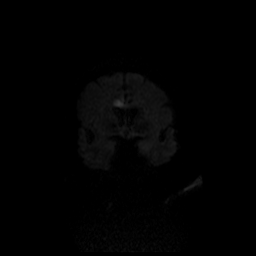
[im 54/78]
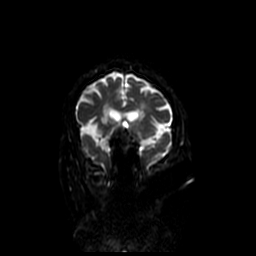
[im 62/78]
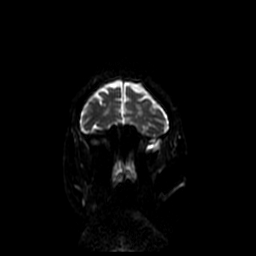
[im 70/78]
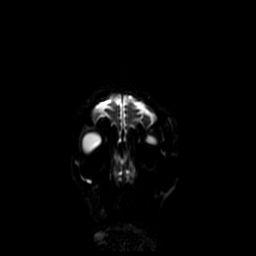
[im 78/78]
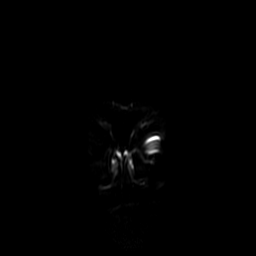

[Series 5: T1 · sagittal · 5.0mm · 0.47mm/px · 3 of 23 slices shown]
[im 1/23]
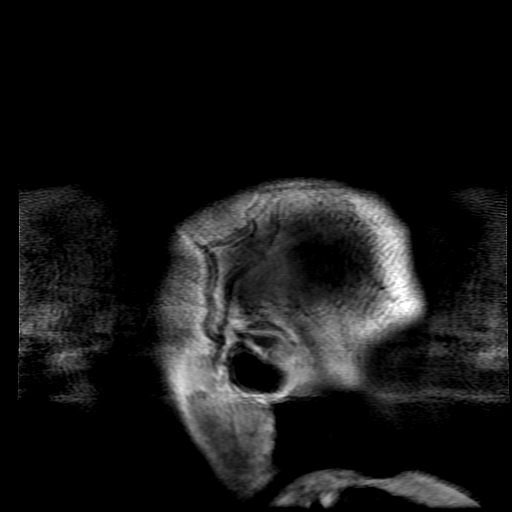
[im 12/23]
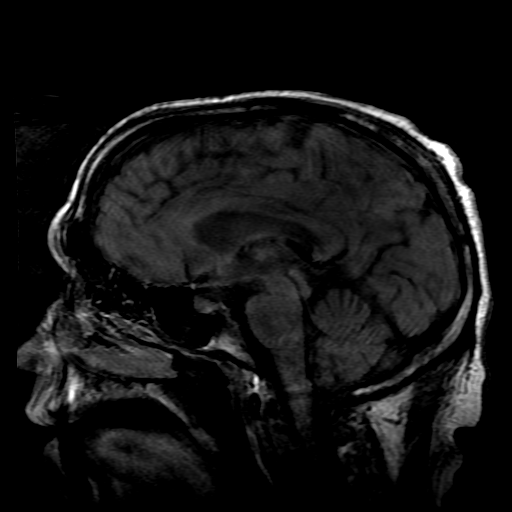
[im 23/23]
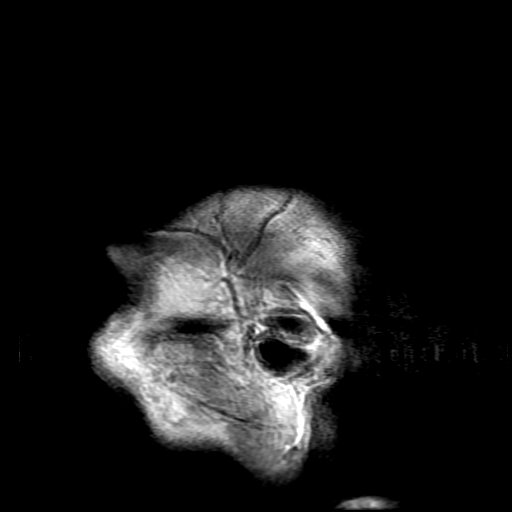

[Series 6: FLAIR · axial · 5.0mm · 0.43mm/px · z∈[-69,+79]mm · 4 of 26 slices shown]
[im 1/26]
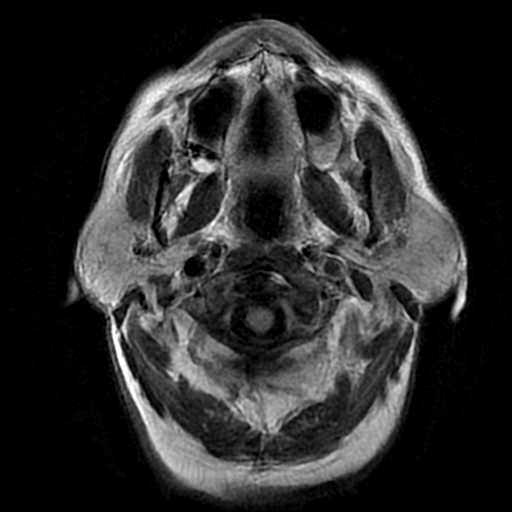
[im 9/26]
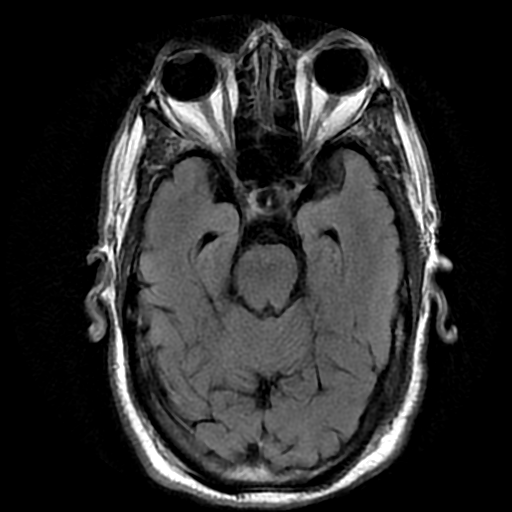
[im 17/26]
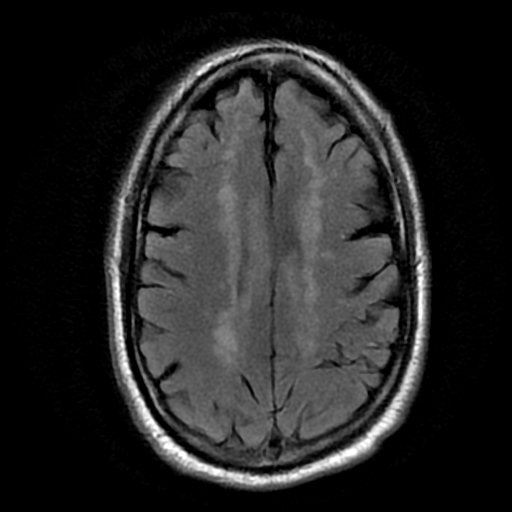
[im 26/26]
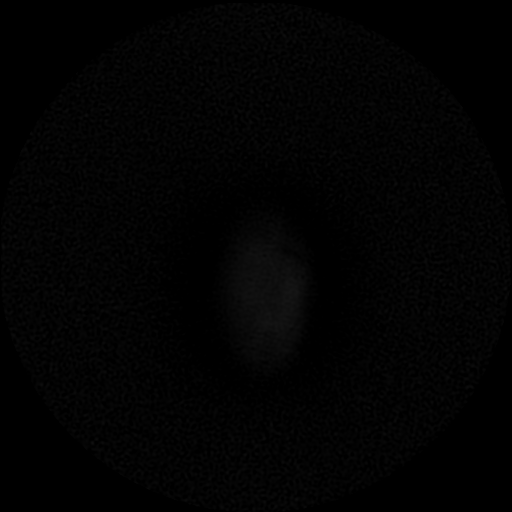

[Series 7: ax mpgr · axial · 5.0mm · 0.43mm/px · z∈[-67,+2]mm · 2 of 22 slices shown]
[im 1/22]
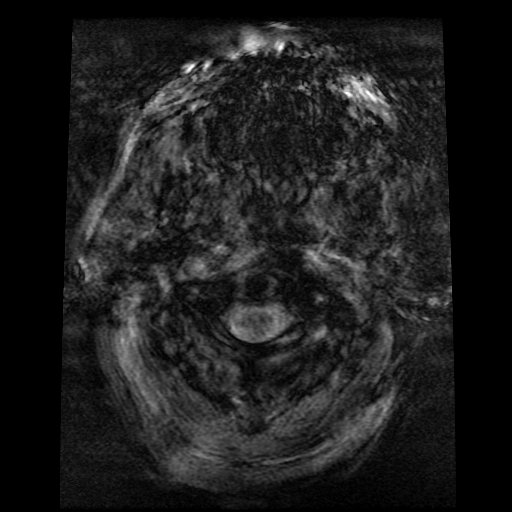
[im 11/22]
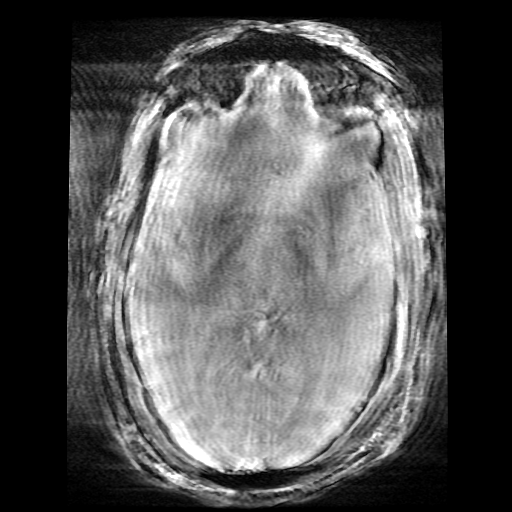

[Series 300: DWI · axial · 3.0mm · 1.09mm/px · z∈[-62,+86]mm · 7 of 51 slices shown (3 of 4)]
[im 1/51]
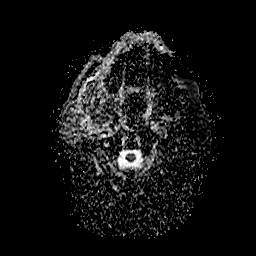
[im 9/51]
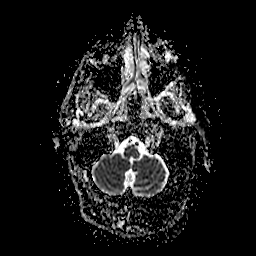
[im 17/51]
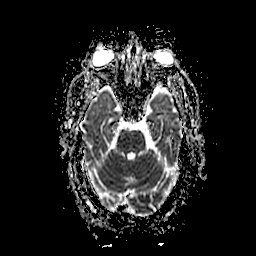
[im 26/51]
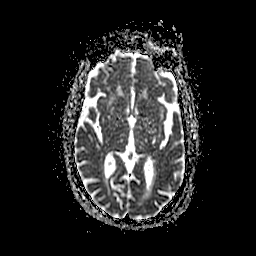
[im 34/51]
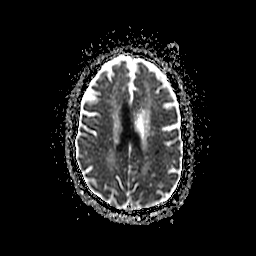
[im 42/51]
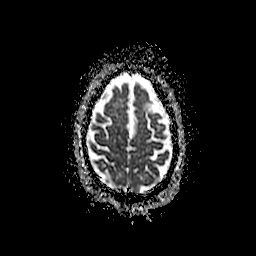
[im 51/51]
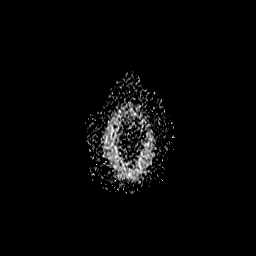

[Series 400: DWI · coronal · 5.0mm · 1.09mm/px · 5 of 39 slices shown (4 of 4)]
[im 1/39]
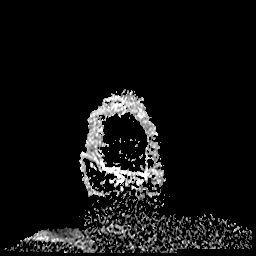
[im 10/39]
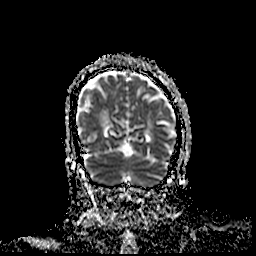
[im 20/39]
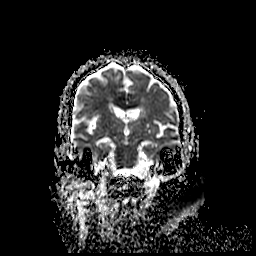
[im 29/39]
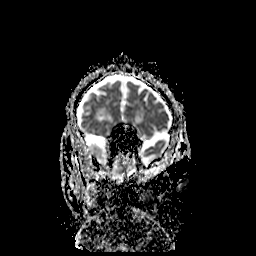
[im 39/39]
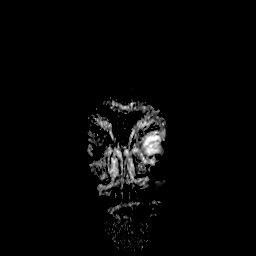

[41 of 48 positions shown; findings below may reference images not displayed]

FINDINGS: No adverse features occurred with respect to the conditional cardiac
pacemaker, but the examination had to be discontinued prior to
completion due to patient agitation, repeated motion artifact.

Axial and coronal DWI. Sagittal T1, axial FLAIR and SWI were
obtained.

Brain: Confluent restricted diffusion in the right cingulate gyrus
and tracking into the right superior frontal gyrus posteriorly
including subcortical motor, pre motor areas. See series 4, image
16. And restricted diffusion also extends across midline at the body
of the corpus callosum and mildly affects the contralateral left
cingulate gyrus (series 4, image 17).

No other restricted diffusion. Only mild acute FLAIR hyperintensity
in the affected areas (series 6, image 17) with more confluent
bilateral cerebral white matter FLAIR hyperintensity, small area of
anterior left frontal lobe cortical encephalomalacia (image 20), and
areas resembling small chronic lacunar infarcts in the cerebral
white matter and bilateral basal ganglia.

No evidence of acute or chronic intracranial hemorrhage. No midline
shift, mass effect, or evidence of intracranial mass lesion. No
ventriculomegaly. Normal basilar cisterns. Negative pituitary and
cervicomedullary junction.

Vascular: Not evaluated.

Skull and upper cervical spine: Negative.

Sinuses/Orbits: Grossly negative.
IMPRESSION: 1. Confluent acute Right > Left ACA territory infarcts, with
involvement of the body of the corpus callosum. No associated
hemorrhage or mass effect.

2. The examination had to be discontinued prior to completion.

3. Evidence of underlying chronic small vessel disease in the
bilateral cerebral white matter, bilateral basal ganglia, anterior
left MCA territory.

## 2023-04-10 ENCOUNTER — Telehealth: Payer: Self-pay | Admitting: *Deleted

## 2023-04-10 NOTE — Telephone Encounter (Signed)
Wife Misty Stanley) dropped off letter in regards to husband's passing.  She is trying to get benefits from the [place].    See Dr. Wyline Mood response.  Wife verbalized understanding & request copy of providers note.    Note placed up front for pick up - she will come on Monday.

## 2023-04-10 NOTE — Telephone Encounter (Signed)
Antoine Poche, MD  Lesle Chris, LPN  Received a form for the [facility] as far as establishing survivor benefits. This patient had passed away and his wife had brought by a form. This would need to be discussed with patient's primary physician. 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 This was not a primarily cardiac cause of his death. 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  Would recommend discussing with pcp if they may be able to help with paperwork   Dominga Ferry MD
